# Patient Record
Sex: Female | Born: 1965 | Race: White | Hispanic: No | Marital: Married | Smoking: Never smoker
Health system: Southern US, Community
[De-identification: ages and names within clinical notes are randomized; demographics above are authoritative.]

## PROBLEM LIST (undated history)

## (undated) DIAGNOSIS — N3010 Interstitial cystitis (chronic) without hematuria: Secondary | ICD-10-CM

## (undated) DIAGNOSIS — E079 Disorder of thyroid, unspecified: Secondary | ICD-10-CM

## (undated) DIAGNOSIS — Z8042 Family history of malignant neoplasm of prostate: Secondary | ICD-10-CM

## (undated) DIAGNOSIS — D490 Neoplasm of unspecified behavior of digestive system: Secondary | ICD-10-CM

## (undated) DIAGNOSIS — Z803 Family history of malignant neoplasm of breast: Secondary | ICD-10-CM

## (undated) DIAGNOSIS — C801 Malignant (primary) neoplasm, unspecified: Secondary | ICD-10-CM

## (undated) HISTORY — DX: Malignant (primary) neoplasm, unspecified: C80.1

## (undated) HISTORY — PX: TONSILLECTOMY: SUR1361

## (undated) HISTORY — DX: Neoplasm of unspecified behavior of digestive system: D49.0

## (undated) HISTORY — DX: Interstitial cystitis (chronic) without hematuria: N30.10

## (undated) HISTORY — DX: Family history of malignant neoplasm of breast: Z80.3

## (undated) HISTORY — DX: Family history of malignant neoplasm of prostate: Z80.42

## (undated) HISTORY — PX: BREAST BIOPSY: SHX20

## (undated) HISTORY — DX: Disorder of thyroid, unspecified: E07.9

---

## 1998-11-05 HISTORY — PX: TUBAL LIGATION: SHX77

## 1998-11-07 ENCOUNTER — Inpatient Hospital Stay (HOSPITAL_COMMUNITY): Admission: AD | Admit: 1998-11-07 | Discharge: 1998-11-10 | Payer: Self-pay | Admitting: *Deleted

## 1999-05-20 ENCOUNTER — Other Ambulatory Visit: Admission: RE | Admit: 1999-05-20 | Discharge: 1999-05-20 | Payer: Self-pay | Admitting: *Deleted

## 1999-11-30 ENCOUNTER — Other Ambulatory Visit: Admission: RE | Admit: 1999-11-30 | Discharge: 1999-11-30 | Payer: Self-pay | Admitting: Obstetrics and Gynecology

## 2000-11-18 ENCOUNTER — Other Ambulatory Visit: Admission: RE | Admit: 2000-11-18 | Discharge: 2000-11-18 | Payer: Self-pay | Admitting: Obstetrics and Gynecology

## 2001-11-20 ENCOUNTER — Other Ambulatory Visit: Admission: RE | Admit: 2001-11-20 | Discharge: 2001-11-20 | Payer: Self-pay | Admitting: Obstetrics and Gynecology

## 2002-03-25 ENCOUNTER — Encounter: Admission: RE | Admit: 2002-03-25 | Discharge: 2002-03-25 | Payer: Self-pay | Admitting: Emergency Medicine

## 2002-03-25 ENCOUNTER — Encounter: Payer: Self-pay | Admitting: Emergency Medicine

## 2002-07-24 ENCOUNTER — Encounter: Admission: RE | Admit: 2002-07-24 | Discharge: 2002-07-24 | Payer: Self-pay | Admitting: Emergency Medicine

## 2002-07-24 ENCOUNTER — Encounter: Payer: Self-pay | Admitting: Emergency Medicine

## 2002-12-06 ENCOUNTER — Other Ambulatory Visit: Admission: RE | Admit: 2002-12-06 | Discharge: 2002-12-06 | Payer: Self-pay | Admitting: Obstetrics and Gynecology

## 2003-07-05 ENCOUNTER — Ambulatory Visit (HOSPITAL_BASED_OUTPATIENT_CLINIC_OR_DEPARTMENT_OTHER): Admission: RE | Admit: 2003-07-05 | Discharge: 2003-07-05 | Payer: Self-pay | Admitting: Urology

## 2003-07-05 ENCOUNTER — Ambulatory Visit (HOSPITAL_COMMUNITY): Admission: RE | Admit: 2003-07-05 | Discharge: 2003-07-05 | Payer: Self-pay | Admitting: Urology

## 2003-07-05 ENCOUNTER — Encounter (INDEPENDENT_AMBULATORY_CARE_PROVIDER_SITE_OTHER): Payer: Self-pay | Admitting: Specialist

## 2004-02-28 ENCOUNTER — Other Ambulatory Visit: Admission: RE | Admit: 2004-02-28 | Discharge: 2004-02-28 | Payer: Self-pay | Admitting: Obstetrics and Gynecology

## 2004-11-30 ENCOUNTER — Encounter: Admission: RE | Admit: 2004-11-30 | Discharge: 2004-11-30 | Payer: Self-pay | Admitting: Obstetrics and Gynecology

## 2005-03-16 ENCOUNTER — Other Ambulatory Visit: Admission: RE | Admit: 2005-03-16 | Discharge: 2005-03-16 | Payer: Self-pay | Admitting: Obstetrics and Gynecology

## 2005-03-25 ENCOUNTER — Encounter: Admission: RE | Admit: 2005-03-25 | Discharge: 2005-03-25 | Payer: Self-pay | Admitting: Obstetrics and Gynecology

## 2006-01-03 ENCOUNTER — Encounter: Admission: RE | Admit: 2006-01-03 | Discharge: 2006-01-03 | Payer: Self-pay | Admitting: Internal Medicine

## 2006-08-06 DIAGNOSIS — C801 Malignant (primary) neoplasm, unspecified: Secondary | ICD-10-CM

## 2006-08-06 HISTORY — PX: APPENDECTOMY: SHX54

## 2006-08-06 HISTORY — DX: Malignant (primary) neoplasm, unspecified: C80.1

## 2006-08-22 ENCOUNTER — Encounter (INDEPENDENT_AMBULATORY_CARE_PROVIDER_SITE_OTHER): Payer: Self-pay | Admitting: Specialist

## 2006-08-22 ENCOUNTER — Inpatient Hospital Stay (HOSPITAL_COMMUNITY): Admission: EM | Admit: 2006-08-22 | Discharge: 2006-08-24 | Payer: Self-pay | Admitting: Emergency Medicine

## 2006-10-17 ENCOUNTER — Ambulatory Visit: Payer: Self-pay | Admitting: Hematology and Oncology

## 2006-10-25 LAB — COMPREHENSIVE METABOLIC PANEL
ALT: 16 U/L (ref 0–35)
Albumin: 4.3 g/dL (ref 3.5–5.2)
BUN: 17 mg/dL (ref 6–23)
Glucose, Bld: 93 mg/dL (ref 70–99)
Potassium: 3.9 mEq/L (ref 3.5–5.3)
Sodium: 138 mEq/L (ref 135–145)
Total Bilirubin: 1.5 mg/dL — ABNORMAL HIGH (ref 0.3–1.2)

## 2006-10-25 LAB — CBC WITH DIFFERENTIAL/PLATELET
BASO%: 0.1 % (ref 0.0–2.0)
EOS%: 0.6 % (ref 0.0–7.0)
Eosinophils Absolute: 0 10*3/uL (ref 0.0–0.5)
HCT: 38.7 % (ref 34.8–46.6)
MCHC: 35.3 g/dL (ref 32.0–36.0)
MCV: 86.5 fL (ref 81.0–101.0)
NEUT#: 3.1 10*3/uL (ref 1.5–6.5)
Platelets: 177 10*3/uL (ref 145–400)
WBC: 5.6 10*3/uL (ref 3.9–10.0)
lymph#: 2.1 10*3/uL (ref 0.9–3.3)

## 2006-11-14 ENCOUNTER — Ambulatory Visit: Payer: Self-pay | Admitting: Gastroenterology

## 2006-11-15 ENCOUNTER — Ambulatory Visit: Payer: Self-pay | Admitting: Gastroenterology

## 2007-04-17 HISTORY — PX: COLONOSCOPY: SHX174

## 2010-06-16 ENCOUNTER — Encounter: Admission: RE | Admit: 2010-06-16 | Discharge: 2010-06-16 | Payer: Self-pay | Admitting: Internal Medicine

## 2011-01-22 NOTE — Discharge Summary (Signed)
[name], [name]                 ACCOUNT NO.:  [id number]   MEDICAL RECORD NO.:  [id number]          PATIENT TYPE:  INP   LOCATION:  5736                         FACILITY:  [facility]   PHYSICIAN:  Velora Heckler, MD      DATE OF BIRTH:  01-Jun-1966   DATE OF ADMISSION:  08/22/2006  DATE OF DISCHARGE:  08/24/2006                               DISCHARGE SUMMARY   ADMITTING PHYSICIAN:  Dr. Janee Morn.   DISCHARGING PHYSICIAN:  Dr. Gerrit Friends.   CHIEF COMPLAINT AND REASON FOR ADMISSION:  [name] is a 45 year old  female patient who presented to the ER because of right lower quadrant  pain beginning 3 days prior.  She had a recent gastrointestinal illness  and thought her pain was coming from that.  She saw her primary care  physician prior to coming to the ER, and he was concerned about  appendicitis; so he sent her to ER for further evaluation.  In the ER,  the patient was afebrile; vital signs were stable.  Her abdomen was  tender in the right lower quadrant with localized guarding, and there  was also a vague mass in the right lower quadrant.  Bowel sounds were  hypoactive.  She did not have any peritoneal signs.  White count was  9400.  CT scan was done that showed acute appendicitis with possible  rupture and a very small fluid collection.  The patient was admitted  with a diagnosis of acute appendicitis, possibly perforated.   HOSPITAL COURSE:  The patient was taken directly from the ER to the  operating room by Dr. Janee Morn where she underwent a laparoscopic  appendectomy for an acute perforated appendicitis.  The patient was  stable and sent back to the surgical floor to recover.   On postop day 1, the patient was stable and white count 10,000,  hemoglobin 11.3.  She was awake, passing flatus, having mild nausea, but  sipping on clears.  She had diminished pain in her abdomen as compared  to preoperatively.  Her abdominal exam revealed a soft, slightly  distended abdomen with active  bowel sounds.  Her incisions were clean,  dry, and intact.  The patient felt she may be having some nausea related  to Percocet; so Vicodin was initiated for pain control.  Toradol was  added IV.  She was continued on Invanz for empiric coverage for the  abscess cultures.   On postop day 2, the patient's white count had normalized to 4800.  Potassium was slightly low at 3.1, and this was repleted orally.  She  was tolerating clears.  She felt much better.  She was passing flatus  and wanted her diet advanced.  Her incisions were clean, dry, and  intact.  Her IV Invanz was discontinued, and she was switched to  Augmentin.  Her diet was advanced, and plans were to discharge home  later in the day.  In addition, she found that she could tolerate the  Percocet for pain and requested that as her discharge pain medication.   FINAL DISCHARGE DIAGNOSIS:  Perforated appendicitis status-post  laparoscopic appendectomy.   DISCHARGE MEDICATIONS:  1. Resume any home medications you were taking prior to admission.  2. Percocet 5/325 mg 1 to 2 tabs every 4 hours as needed for pain; #40      dispensed with zero refills.  3. Augmentin 875 mg b.i.d. for 7 days.   RETURN TO WORK:  In 2 weeks.   DIET:  No restrictions.   ACTIVITY:  May shower and walk up steps.  May increase activity slowly.  No driving while taking Percocet.  No lifting more than 15 pounds for 2  weeks.   WOUND CARE:  Allow Steri-Strips to fall off.   OTHER INSTRUCTIONS:  Call the surgeon if:  A.  Fever more than 101 degrees Fahrenheit  B.  Nausea, vomiting, or diarrhea.  C.  New or increased belly pain.  D.  Redness or drainage from wounds.   FOLLOWUP APPOINTMENT:  You are to see Dr. Janee Morn in 2 weeks.  Please  call for an appointment.      [name], N.P.      Velora Heckler, MD  Electronically Signed    ALE/MEDQ  D:  10/11/2006  T:  10/11/2006  Job:  [id number]   cc:   Gabrielle Dare. Janee Morn, M.D.

## 2011-01-22 NOTE — H&P (Signed)
[name], [name]                 ACCOUNT NO.:  [id number]   MEDICAL RECORD NO.:  [id number]          PATIENT TYPE:  INP   LOCATION:  1826                         FACILITY:  [facility]   PHYSICIAN:  Gabrielle Dare. Janee Morn, M.D.DATE OF BIRTH:  [birth year]   DATE OF ADMISSION:  08/22/2006  DATE OF DISCHARGE:                              HISTORY & PHYSICAL   CHIEF COMPLAINT:  Right lower quadrant abdominal pain.   HISTORY OF PRESENT ILLNESS:  [name] is a pleasant 45 year old white  female who complains of right lower quadrant pain that initially started  3 days ago on Friday.  It persisted through the weekend.  She had a  recent GI illness, and she attributed the pain to that.  The pain was  persistent, and she went and saw Dr. Zachery Dauer this morning.  Dr. Zachery Dauer  was very concerned about possible appendicitis, and she was sent to  [facility] Emergency Department for further evaluation of such.  She  continues to have pain in the right lower quadrant with anorexia but no  emesis.   PAST MEDICAL HISTORY:  1. Hypothyroidism.  2. Interstitial cystitis.   PAST SURGICAL HISTORY:  1. Tonsillectomy.  2. Tubal ligation.   SOCIAL HISTORY:  She does not smoke.  She rarely drinks alcohol.  She  works as a Production designer, theatre/television/film in [employer].   ALLERGIES:  SULFA, which causes hives.  She has some intolerance to ASPIRIN.   MEDICATIONS:  1. Armour Thyroid.  2. Zyrtec, which she describes as a Zyrtec equivalent.   REVIEW OF SYSTEMS:  A 15-system review is completed.  It is just  positive for GI section for anorexia and abdominal pain as listed above.  No other pertinent positives were noted.   PHYSICAL EXAMINATION:  VITAL SIGNS:  Temperature 97.8, pulse of 97,  respirations 16, blood pressure 130/75.  GENERAL:  She is awake, alert, and well-appearing.  HEENT:  Pupils are equal.  Oral mucosa is dry.  NECK:  Supple with no masses.  LUNGS:  Clear to auscultation.  HEART:  Regular, with no murmurs  heard, and pulse is palpable in the  left chest.  ABDOMEN:  Tender in the right lower quadrant with localized guarding.  There is also a vague mass present down there.  Bowel sounds are  hypoactive.  The remainder of the abdomen is not tender.  There is no  significant distention.  SKIN:  Warm and dry, with no rashes.  CNS:  No focal weakness on gross examination.   LABORATORY STUDIES:  Include liver function tests that are within normal  limits.  Sodium 135, potassium 3.2, chloride 99, CO2 of 28, BUN 6,  creatinine 0.6, glucose 100.  White blood cell count 9.4, hemoglobin  13.9, hematocrit 40, platelets 197.  Laboratory studies include CT scan  of the abdomen and pelvis showing acute appendicitis with possible  rupture and a very small fluid collection.   IMPRESSION:  Acute appendicitis, possibly ruptured.   PLAN:  We will start IV antibiotics immediately.  We will take her to  the operating room emergently tonight for  laparoscopic appendectomy.  The procedure, risks, and benefits were discussed in detail with the  patient.  We also discussed the possibility for conversion to an open  procedure or an exploratory laparotomy.  Questions were answered, and  she is agreeable.      Gabrielle Dare Janee Morn, M.D.  Electronically Signed     BET/MEDQ  D:  08/22/2006  T:  08/23/2006  Job:  [id number]   cc:   Allena Napoleon

## 2011-01-22 NOTE — Op Note (Signed)
[name], [name]                           ACCOUNT NO.:  [id number]   MEDICAL RECORD NO.:  [id number]                   PATIENT TYPE:  AMB   LOCATION:  NESC                                 FACILITY:  [facility]   PHYSICIAN:  Jamison Neighbor, M.D.               DATE OF BIRTH:  [birth year]   DATE OF PROCEDURE:  07/05/2003  DATE OF DISCHARGE:                                 OPERATIVE REPORT   SERVICE:  Urology.   PREOPERATIVE DIAGNOSES:  Pelvic pain, possible interstitial cystitis.   POSTOPERATIVE DIAGNOSES:  Pelvic pain, possible interstitial cystitis.   PROCEDURE:  Cystoscopy, urethral calibration, hydrodistention of the  bladder, Marcaine and Pyridium installation, Marcaine and Kenalog injection.   SURGEON:  Jamison Neighbor, M.D.   ANESTHESIA:  General.   COMPLICATIONS:  None.   DRAINS:  None.   BRIEF HISTORY:  This 45 year old female developed problems with urination up  to 18 times a day. This has been going on for the past month to month and a  half. The patient was evaluated in the office on June 19, 2003 and it was  felt that based on her symptoms and exam that she might have interstitial  cystitis. She was given the opportunity to have potassium testing but  changed her mind and elected to undergo cystoscopy and hydrodistention. She  is aware of the fact that there is no guarantee that this will give her any  relief but if she does get relief and turns out to have interstitial  cystitis that this can last for a viable length of time. It certainly will  not be a long-term cure for her problem. The patient gave full and informed  consent.   DESCRIPTION OF PROCEDURE:  After successful induction of general anesthesia,  the patient was placed in the dorsal lithotomy position, prepped with  Betadine and draped in the usual sterile fashion. Careful bimanual  examination revealed no cystocele, rectocele or enterocele. There were no  masses on bimanual exam. The urethra  was of normal caliber accepting a 78  [place] female urethral sound with no evidence of stenosis or stricture. The  cystoscope was inserted, the bladder was carefully inspected and was free of  any tumor or stones. Both ureteral orifices were normal in configuration and  location. The bladder was distended at a pressure of 100 cm of water for  five minutes and when the bladder was drained the patient was found to have  a normal bladder capacity of 1100 mL. The patient had modest glomerulations  present but no ulcers were seen. It is felt that based on the patient's  symptoms as well as these findings that she may have early interstitial  cystitis but a definitive decision as to whether this is an appropriate way  to treat her symptoms will depend on the results of the hydrodistention in  so far as  symptom control  is concerned and also results of a bladder biopsy. The  biopsy was performed and the biopsy site was cauterized. This was sent for  mast cell analysis. The patient tolerated the procedure well and was taken  to the recovery room in good condition.                                               Jamison Neighbor, M.D.    RJE/MEDQ  D:  07/05/2003  T:  07/05/2003  Job:  [id number]   cc:   Marcelino Duster L. Vincente Poli, M.D.  [address], [address]  [place] [address]  Fax: [phone]   Reuben Likes, M.D.  [address]  [place]  [place] [address]  Fax: [phone]

## 2011-01-22 NOTE — Op Note (Signed)
NAMETIYANA, GALLA                 ACCOUNT NO.:  [id number]   MEDICAL RECORD NO.:  [id number]          PATIENT TYPE:  INP   LOCATION:  1826                         FACILITY:  [facility]   PHYSICIAN:  Gabrielle Dare. Janee Morn, M.D.DATE OF BIRTH:  Dec 28, 1965   DATE OF PROCEDURE:  08/22/2006  DATE OF DISCHARGE:                               OPERATIVE REPORT   PREOPERATIVE DIAGNOSIS:  Acute appendicitis with possible perforation.   POSTOPERATIVE DIAGNOSIS:  Acute perforated appendicitis.   PROCEDURE:  Laparoscopic appendectomy.   SURGEON:  Gabrielle Dare. Janee Morn, M.D.   ANESTHESIA:  General.   HISTORY OF PRESENT ILLNESS:  Ms. Tokar is a 45 year old white female who  was seen in Dr. Martyn Ehrich office today complaining of right lower  quadrant abdominal pain since last Friday.  She was sent to the [facility] emergency department where workup included a CT scan of the abdomen  and pelvis.  This demonstrated acute appendicitis with possible  perforation.  She was brought emergently to the operating room for  laparoscopic appendectomy.   PROCEDURE IN DETAIL:  Informed consent was obtained.  The patient  received intravenous antibiotics.  She was brought to the operating  room, and general anesthesia was administered.  Her abdomen was prepped  and draped in a sterile fashion.  The infraumbilical region was  infiltrated with 0.25% Marcaine with epinephrine.   An infraumbilical incision was made.  The subcutaneous tissues were  dissected down, revealing the anterior fascia.  This was divided along  the midline, and the peritoneal cavity was entered under direct vision  without difficulty.  A 0 Vicryl pursestring suture was placed around the  fascial opening, and the Hassan trocar was inserted into the abdomen.  The abdomen was insufflated with carbon dioxide in standard fashion.  Under direct vision, a 12-mm left lower quadrant and a 5-mm right upper  quadrant port were placed. 0.25% Marcaine with  epinephrine was used at  both port sites.  Laparoscopic exploration revealed inflammation coming  down off of the base of the cecum, with the terminal ileum plastered up  against the pelvic sidewall that was gently swept away, revealing a very  inflamed and perforated appendix that was stuck down next to the round  ligament on the right.  This was carefully swept away from the pelvic  sidewall, allowing the cecum to be more mobilized.  The base of the  appendix was intact.  This was then dissected out and divided with an  endoscopic GIA stapler with a vascular load.  The mesoappendix was then  dissected out a little bit further, and the mesoappendix was divided  with an endoscopic GIA stapler with vascular load.  The appendix, which  was perforated along the midportion of the shaft, was placed in an  EndoCatch bag and taken out of the abdomen via the left lower quadrant  port site.  At this time, the abdomen was copiously irrigated with 3 L  of saline.  The area next to the round ligament pelvic sidewall was  debrided of some fibrinous exudate and copiously irrigated.  The  appendiceal base staple line was intact.  The mesoappendix was double  checked, and there was no bleeding.  Further irrigation was continued,  and this returned clear.  The staple lines were rechecked, and there was  no bleeding.  The remainder of the irrigation fluid was evacuated, and  it was clear.  The ports were then removed under direct vision.  The  pneumoperitoneum was released.  The [facility] trocar was removed.  The  infraumbilical fascia was closed by tying the 0 Vicryl pursestring  suture, with care not to trap any intra-abdominal contents.  All three  wounds were copiously irrigated.  Some additional local anesthetic was  injected, and the skin of each was closed with a running 4-0 Vicryl  subcuticular stitch.  Sponge, needle, and instrument counts were  correct.  Benzoin, Steri-Strips, and sterile dressings  were applied.   The patient tolerated procedure well without apparent complication and  was taken to the recovery room in stable condition.      Gabrielle Dare Janee Morn, M.D.  Electronically Signed     BET/MEDQ  D:  08/23/2006  T:  08/23/2006  Job:  [id number]   cc:   Allena Napoleon

## 2011-01-22 NOTE — Assessment & Plan Note (Signed)
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   ASSESSMENT/PLAN:  Mrs. Fleer is a very nice 45 year old white female  referred through the courtesy of Dr. Allena Napoleon. She presented to  [facility] in mid-December with acute right lower quadrant pain  and was found to have acute appendicitis by CT scan. She underwent a  laparoscopic appendectomy by Dr. Violeta Gelinas on August 22, 2006,  which found an acute perforated appendicitis. Surgical pathology  revealed a cystic mucinous neoplasm with extravasated mucin extending to  the serosal surface. The base of the appendix was free of tumor. She was  seen in consultation by Dr. Flonnie Hailstone at [facility] who apparently is a gastrointestinal surgical oncologist.  Apparently, CT scan performed at [facility] revealed a small  lesion in the liver. Further evaluation is planned with an MRI. The CT  scan at [facility] prior to her appendectomy did not reveal any abnormality in  the liver.   She relates she had problems with constipation in the past, but has been  taking a fiber supplement that regularized her bowel habits. She has  been having more frequent stools recently, up to 2-3 times a day, which  is a slight change for her. She has a history of bleeding hemorrhoids,  which cause problems when she had constipation. These symptoms have been  absent for several years. There is no family history of colon cancer,  colon polyps or inflammatory bowel disease.   PAST MEDICAL HISTORY:  1. Interstitial cystitis.  2. Hypothyroidism.  3. 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  4. The remainder is in the History of  Present Illness.   CURRENT MEDICATIONS:  Listed on the chart; updated and reviewed.   MEDICATION ALLERGY:  SULFA DRUGS LEADING TO HIVES.   SOCIAL HISTORY:  Per the handwritten form.   REVIEW OF SYSTEMS:  Per the handwritten form.   PHYSICAL EXAMINATION:  Well-developed, well-nourished white female in no  acute distress. Height is 5 feet, 7 inches. Weight 137 pounds. Blood  pressure 106/54. Pulse 60 and regular.  HEENT: Anicteric sclerae. Oropharynx clear.  CHEST: Clear to auscultation bilaterally.  CARDIAC: Regular rate and rhythm without murmurs appreciated.  ABDOMEN: Soft and nontender. Nondistended. Normoactive bowel sounds. No  palpable organomegaly, masses or hernias.  RECTAL: Deferred to time of colonoscopy.  EXTREMITIES: Without clubbing, cyanosis or edema.  NEUROLOGIC: Alert and oriented x3. Grossly nonfocal.   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 Prior history of hematochezia and hemorrhoids-now      inactive. Recent change in bowel habits with more frequent stools.      Rule out colorectal neoplasms and rule out cecal involvement of the  mucinous cystic neoplasm. The base of the appendix was clear on      pathology so I feel it is unlikely we will find involvement of the      colon. Risks, benefits and alternatives to colonoscopy and possible      biopsy and possible polypectomy discussed with the patient and she      consents to proceed. This will be scheduled electively.  2. Abnormal CT scan of the liver. Awaiting results of her MRI from      [facility]. Further followup      with Dr. Flonnie Hailstone.     Venita Lick. Russella Dar, MD, [facility]  Electronically Signed    MTS/MedQ  DD: 11/14/2006  DT: 11/14/2006  Job #: [id number]   cc:   Allena Napoleon

## 2011-11-04 ENCOUNTER — Encounter: Payer: Self-pay | Admitting: Gastroenterology

## 2012-03-14 ENCOUNTER — Other Ambulatory Visit: Payer: Self-pay | Admitting: Internal Medicine

## 2012-03-14 DIAGNOSIS — N6019 Diffuse cystic mastopathy of unspecified breast: Secondary | ICD-10-CM

## 2012-03-23 ENCOUNTER — Other Ambulatory Visit: Payer: Self-pay

## 2012-03-24 ENCOUNTER — Ambulatory Visit
Admission: RE | Admit: 2012-03-24 | Discharge: 2012-03-24 | Disposition: A | Discharge status: Home / Self Care | Payer: BC Managed Care – PPO | Source: Ambulatory Visit | Attending: Internal Medicine | Admitting: Internal Medicine

## 2012-03-24 DIAGNOSIS — N6019 Diffuse cystic mastopathy of unspecified breast: Secondary | ICD-10-CM

## 2012-06-27 ENCOUNTER — Encounter: Payer: Self-pay | Admitting: Gastroenterology

## 2012-09-04 ENCOUNTER — Other Ambulatory Visit: Payer: Self-pay | Admitting: Internal Medicine

## 2012-09-04 DIAGNOSIS — N6019 Diffuse cystic mastopathy of unspecified breast: Secondary | ICD-10-CM

## 2012-09-22 ENCOUNTER — Ambulatory Visit
Admission: RE | Admit: 2012-09-22 | Discharge: 2012-09-22 | Disposition: A | Discharge status: Home / Self Care | Payer: BC Managed Care – PPO | Source: Ambulatory Visit | Attending: Internal Medicine | Admitting: Internal Medicine

## 2012-09-22 DIAGNOSIS — N6019 Diffuse cystic mastopathy of unspecified breast: Secondary | ICD-10-CM

## 2013-02-15 ENCOUNTER — Other Ambulatory Visit: Payer: Self-pay | Admitting: Internal Medicine

## 2013-02-15 DIAGNOSIS — D242 Benign neoplasm of left breast: Secondary | ICD-10-CM

## 2013-03-30 ENCOUNTER — Ambulatory Visit
Admission: RE | Admit: 2013-03-30 | Discharge: 2013-03-30 | Disposition: A | Discharge status: Home / Self Care | Payer: BC Managed Care – PPO | Source: Ambulatory Visit | Attending: Internal Medicine | Admitting: Internal Medicine

## 2013-03-30 DIAGNOSIS — D242 Benign neoplasm of left breast: Secondary | ICD-10-CM

## 2014-02-26 ENCOUNTER — Other Ambulatory Visit: Payer: Self-pay | Admitting: Internal Medicine

## 2014-02-26 DIAGNOSIS — N63 Unspecified lump in unspecified breast: Secondary | ICD-10-CM

## 2014-03-11 ENCOUNTER — Encounter: Payer: Self-pay | Admitting: Family Medicine

## 2014-03-11 ENCOUNTER — Other Ambulatory Visit (INDEPENDENT_AMBULATORY_CARE_PROVIDER_SITE_OTHER): Payer: BC Managed Care – PPO

## 2014-03-11 ENCOUNTER — Ambulatory Visit (INDEPENDENT_AMBULATORY_CARE_PROVIDER_SITE_OTHER): Payer: BC Managed Care – PPO | Admitting: Family Medicine

## 2014-03-11 VITALS — BP 122/82 | HR 75 | Ht 66.5 in | Wt 174.0 lb

## 2014-03-11 DIAGNOSIS — M545 Low back pain, unspecified: Secondary | ICD-10-CM

## 2014-03-11 DIAGNOSIS — M79671 Pain in right foot: Secondary | ICD-10-CM

## 2014-03-11 DIAGNOSIS — M79673 Pain in unspecified foot: Secondary | ICD-10-CM | POA: Insufficient documentation

## 2014-03-11 DIAGNOSIS — M79609 Pain in unspecified limb: Secondary | ICD-10-CM

## 2014-03-11 DIAGNOSIS — M2140 Flat foot [pes planus] (acquired), unspecified foot: Secondary | ICD-10-CM

## 2014-03-11 DIAGNOSIS — M9981 Other biomechanical lesions of cervical region: Secondary | ICD-10-CM

## 2014-03-11 DIAGNOSIS — M549 Dorsalgia, unspecified: Secondary | ICD-10-CM | POA: Insufficient documentation

## 2014-03-11 DIAGNOSIS — M999 Biomechanical lesion, unspecified: Secondary | ICD-10-CM | POA: Insufficient documentation

## 2014-03-11 DIAGNOSIS — M2141 Flat foot [pes planus] (acquired), right foot: Secondary | ICD-10-CM | POA: Insufficient documentation

## 2014-03-11 DIAGNOSIS — M2142 Flat foot [pes planus] (acquired), left foot: Secondary | ICD-10-CM

## 2014-03-11 MED ORDER — NITROGLYCERIN 0.2 MG/HR TD PT24: MEDICATED_PATCH | TRANSDERMAL | Status: DC

## 2014-03-11 NOTE — Patient Instructions (Addendum)
Good to see you again Have a great trip Wear the brace to help support the heel.  Ice bath 20 minutes 2 times daily.  Consider turmeric 500mg  twice daily. And make sure Vitamin D 2000 IU daily at least.  NEVER BAREFOOT foot now.  Come back after trip for more manipulaito nand to check the heel.  You may need custom orthotics in the long run.  Nitroglycerin Protocol   Apply 1/4 nitroglycerin patch to affected area daily.  Change position of patch within the affected area every 24 hours.  You may experience a headache during the first 1-2 weeks of using the patch, these should subside.  If you experience headaches after beginning nitroglycerin patch treatment, you may take your preferred over the counter pain reliever.  Another side effect of the nitroglycerin patch is skin irritation or rash related to patch adhesive.  Please notify our office if you develop more severe headaches or rash, and stop the patch.  Tendon healing with nitroglycerin patch may require 12 to 24 weeks depending on the extent of injury.  Men should not use if taking Viagra, Cialis, or Levitra.   Do not use if you have migraines or rosacea.

## 2014-03-11 NOTE — Assessment & Plan Note (Signed)
Patient does have poor core strength. Patient was given some home exercises as well as some postural exercises that I think will be beneficial. We discussed icing and over-the-counter medications that can be beneficial. Patient did respond very well to manipulation and will continue to come on a fairly regular basis. Patient will come back again in 3 weeks for further evaluation and treatment.

## 2014-03-11 NOTE — Progress Notes (Signed)
[name] [place] [place], [place] [address] Phone: [phone] Subjective:    I'm seeing this patient by the request  of:  Dr. Adriana Simas  CC: Right heel pain she had chronic back pain  [id number] [name] is a 48 y.o. female coming in with complaint of right heel pain and chronic back pain.  The patient has had this heel pain for at least one month. Patient states it seems to be getting worse. Patient states it is worse with walking. Denies any radiation of pain or any numbness or tingling. Patient states though that of a dull aching pain even at rest now. Patient states that she noticed it first when she was walking [place]. Patient was walking significant amount of mileage at that time. Since then she continues to try to work out more and is unable to do so secondary to the pain history severity of 8/10.   She is also having some chronic low back pain. Patient has had this pain for quite some time. Patient has tried many modalities including acupuncture and chiropractic with minimal to moderate benefit. Patient states that she has had x-rays previously which were fairly unremarkable. Denies any radiation into the legs any numbness or weakness. Denies any association with bowel or bladder incontinence. Overall more of a dull aching sensation that seems to be localized in worse after activity.    Past medical history, social, surgical and family history all reviewed in electronic medical record.   Review of Systems: No headache, visual changes, nausea, vomiting, diarrhea, constipation, dizziness, abdominal pain, skin rash, fevers, chills, night sweats, weight loss, swollen lymph nodes, body aches, joint swelling, muscle aches, chest pain, shortness of breath, mood changes.   Objective Blood pressure 122/82, pulse 75, height 5' 6.5" (1.689 m), weight 174 lb (78.926 kg), SpO2 98.00%.  General: No apparent distress alert and oriented x3  mood and affect normal, dressed appropriately. Overweight HEENT: Pupils equal, extraocular movements intact  Respiratory: Patient's speak in full sentences and does not appear short of breath  Cardiovascular: No lower extremity edema, non tender, no erythema  Skin: Warm dry intact with no signs of infection or rash on extremities or on axial skeleton.  Abdomen: Soft nontender  Neuro: Cranial nerves II through XII are intact, neurovascularly intact in all extremities with 2+ DTRs and 2+ pulses.  Lymph: No lymphadenopathy of posterior or anterior cervical chain or axillae bilaterally.  Gait antalgic with good balance and coordination.  MSK:  Non tender with full range of motion and good stability and symmetric strength and tone of shoulders, elbows, wrist, hip, knee and ankles bilaterally.  Back Exam:  Inspection: Unremarkable but poor course strength Motion: Flexion 45 deg, Extension 25 deg, Side Bending to 35 deg bilaterally,  Rotation to 35 deg bilaterally  SLR laying: Negative  XSLR laying: Negative  Palpable tenderness: mild paraspinal musculature of the lumbar spine but no spinous process tenderness  FABER: negative. Sensory change: Gross sensation intact to all lumbar and sacral dermatomes.  Reflexes: 2+ at both patellar tendons, 2+ at achilles tendons, Babinski's downgoing.  Strength at foot  Plantar-flexion: 5/5 Dorsi-flexion: 5/5 Eversion: 5/5 Inversion: 5/5  Leg strength  Quad: 5/5 Hamstring: 5/5 Hip flexor: 5/5 Hip abductors: 5/5  Gait antalgic Foot exam shows the patient does have mild pes planus bilaterally with significant over pronation of the hindfoot.  OMT Physical Exam   Standing flexion left  Seated Flexion Left Cervical  C2 flexed  rotated and side bent right C4 flexed rotated inside that were  Thoracic T6 extended rotated and side bent left T9 extended rotated and side bent right  Lumbar L2, 3 flexed rotated inside that right  Sacrum Left on left      Impression and Recommendations:     This case required medical decision making of moderate complexity.

## 2014-03-11 NOTE — Assessment & Plan Note (Signed)
Decision today to treat with OMT was based on Physical Exam  After verbal consent patient was treated with HVLA and ME techniques in , thoracic, lumbar and sacral areas  Patient tolerated the procedure well with improvement in symptoms  Patient given exercises, stretches and lifestyle modifications  See medications in patient instructions if given  Patient will follow up in 3 weeks

## 2014-03-11 NOTE — Assessment & Plan Note (Signed)
Patient is having a stress reaction of the calcaneus with hypoechoic changes on ultrasound as well as what appears to be a callus formation forming. Patient was given a compression to help decrease the amount of pressure on this area. We discussed icing and will start a nitroglycerin patch. Patient will try these interventions and come back again in 3-4 weeks for further evaluation and treatment.

## 2014-04-04 ENCOUNTER — Encounter: Payer: Self-pay | Admitting: Family Medicine

## 2014-04-04 ENCOUNTER — Ambulatory Visit (INDEPENDENT_AMBULATORY_CARE_PROVIDER_SITE_OTHER): Payer: BC Managed Care – PPO | Admitting: Family Medicine

## 2014-04-04 ENCOUNTER — Other Ambulatory Visit (INDEPENDENT_AMBULATORY_CARE_PROVIDER_SITE_OTHER): Payer: BC Managed Care – PPO

## 2014-04-04 VITALS — BP 110/80 | HR 102 | Temp 98.5°F | Wt 175.0 lb

## 2014-04-04 DIAGNOSIS — M545 Low back pain, unspecified: Secondary | ICD-10-CM

## 2014-04-04 DIAGNOSIS — M999 Biomechanical lesion, unspecified: Secondary | ICD-10-CM

## 2014-04-04 DIAGNOSIS — M79609 Pain in unspecified limb: Secondary | ICD-10-CM

## 2014-04-04 DIAGNOSIS — M9981 Other biomechanical lesions of cervical region: Secondary | ICD-10-CM

## 2014-04-04 DIAGNOSIS — M79671 Pain in right foot: Secondary | ICD-10-CM

## 2014-04-04 NOTE — Assessment & Plan Note (Signed)
Patient is back pain is improving. With patient though being in a Cam Walker for the next couple weeks she likely will have malalignment. Patient will continue with the exercises as well as the natural supplementation. We gave patient more core exercises of my but official and to avoid long walking at this time. Patient will come back and see me again in 2 weeks for further evaluation.

## 2014-04-04 NOTE — Patient Instructions (Signed)
Good to see you Wear the boot except for driving and sleeping for next 2 weeks Continue the nitro patch Ice 20 minutes at night.  Vitamin D 4000IU dialy for 2 weeks then back to 2000IU daily Come back in 2-3 weeks and we will see how we are doing.  Good luck with your son.

## 2014-04-04 NOTE — Progress Notes (Signed)
[name] [address] [place], [place] [address] Phone: [phone] Subjective:    CC: Right heel pain she had chronic back pain follow up.   DPO:[name] is a 48 y.o. female coming in with complaint of right heel pain and chronic back pain.   right heel pain before and did have a calcaneal stress fracture after all her walking. Patient was given a brace, nitroglycerin as well as ice protocol. Patient states mild improvement 10%.  Still pain with walking, no improvement with nitro. No side effects  No nighttime awakenings.     She is also having some chronic low back pain.     Past medical history, social, surgical and family history all reviewed in electronic medical record.   Review of Systems: No headache, visual changes, nausea, vomiting, diarrhea, constipation, dizziness, abdominal pain, skin rash, fevers, chills, night sweats, weight loss, swollen lymph nodes, body aches, joint swelling, muscle aches, chest pain, shortness of breath, mood changes.   Objective Blood pressure 110/80, pulse 102, temperature 98.5 F (36.9 C), temperature source Oral, weight 175 lb (79.379 kg), SpO2 95.00%.  General: No apparent distress alert and oriented x3 mood and affect normal, dressed appropriately. Overweight HEENT: Pupils equal, extraocular movements intact  Respiratory: Patient's speak in full sentences and does not appear short of breath  Cardiovascular: No lower extremity edema, non tender, no erythema  Skin: Warm dry intact with no signs of infection or rash on extremities or on axial skeleton.  Abdomen: Soft nontender  Neuro: Cranial nerves II through XII are intact, neurovascularly intact in all extremities with 2+ DTRs and 2+ pulses.  Lymph: No lymphadenopathy of posterior or anterior cervical chain or axillae bilaterally.  Gait antalgic with good balance and coordination.  MSK:  Non tender with full range of motion and good stability  and symmetric strength and tone of shoulders, elbows, wrist, hip, knee and ankles bilaterally.  Back Exam:  Inspection: Unremarkable but poor course strength Motion: Flexion 45 deg, Extension 35 deg, Side Bending to 35 deg bilaterally,  Rotation to 35 deg bilaterally  SLR laying: Negative  XSLR laying: Negative  Palpable tenderness: mild paraspinal musculature of the lumbar spine but no spinous process tenderness  FABER: negative. Sensory change: Gross sensation intact to all lumbar and sacral dermatomes.  Reflexes: 2+ at both patellar tendons, 2+ at achilles tendons, Babinski's downgoing.  Strength at foot  Plantar-flexion: 5/5 Dorsi-flexion: 5/5 Eversion: 5/5 Inversion: 5/5  Leg strength  Quad: 5/5 Hamstring: 5/5 Hip flexor: 5/5 Hip abductors: 4/5  Gait antalgic Foot exam shows the patient does have mild pes planus bilaterally with significant over pronation of the hindfoot.  OMT Physical Exam   Standing flexion left  Seated Flexion Left Cervical  C2 flexed rotated and side bent right C4 flexed rotated inside that were  Thoracic T6 extended rotated and side bent left T9 extended rotated and side bent right  Lumbar L2,3 flexed rotated inside that right  Sacrum Left on left  MSK US performed of: right heel   This study was ordered, performed, and interpreted by Charlann Boxer D.O.  Foot/Ankle:   All structures visualized.   Talar dome unremarkable  Ankle mortise without effusion. Peroneus longus and brevis tendons unremarkable on long and transverse views without sheath effusions. Posterior tibialis, flexor hallucis longus, and flexor digitorum longus tendons unremarkable on long and transverse views without sheath effusions. Achilles tendon visualized along length of tendon and unremarkable on long and  transverse views without sheath effusion. Anterior Talofibular Ligament and Calcaneofibular Ligaments unremarkable and intact. Deltoid Ligament unremarkable and  intact. Plantar fascia intact and without effusion, normal thickness. Patient though calcaneus at the tip just proximal to the area of the insertion of plantar fascia has some good callus formation. Mild overlying hypoechoic changes but very minimal. Seems to be healing from previous exam.  IMPRESSION:  Calcaneal stress fracture seems to be healing.       Impression and Recommendations:     This case required medical decision making of moderate complexity.

## 2014-04-04 NOTE — Assessment & Plan Note (Signed)
Decision today to treat with OMT was based on Physical Exam  After verbal consent patient was treated with HVLA and ME techniques in , thoracic, lumbar and sacral areas  Patient tolerated the procedure well with improvement in symptoms  Patient given exercises, stretches and lifestyle modifications  See medications in patient instructions if given  Patient will follow up in 2 weeks

## 2014-04-04 NOTE — Assessment & Plan Note (Signed)
Patient virtually continues to have pain and did not make any significant improvement with previous treatment. At this point to make a more conservative and palpation of the Cam Walker boot. Patient though she'll be wearing a somewhere between 2 and 4 weeks. We discussed starting the icing protocol again in continuing a nitroglycerin patch. Patient will do this and come back in 2 weeks for further evaluation we will do another ultrasound to further evaluate.  Spent greater than 25 minutes with patient face-to-face and had greater than 50% of counseling including as described above in assessment and plan.

## 2014-04-04 NOTE — Progress Notes (Signed)
Pre visit review using our clinic review tool, if applicable. No additional management support is needed unless otherwise documented below in the visit note. 

## 2014-04-05 ENCOUNTER — Ambulatory Visit
Admission: RE | Admit: 2014-04-05 | Discharge: 2014-04-05 | Disposition: A | Discharge status: Home / Self Care | Payer: BC Managed Care – PPO | Source: Ambulatory Visit | Attending: Internal Medicine | Admitting: Internal Medicine

## 2014-04-05 ENCOUNTER — Other Ambulatory Visit: Payer: Self-pay | Admitting: Internal Medicine

## 2014-04-05 DIAGNOSIS — N63 Unspecified lump in unspecified breast: Secondary | ICD-10-CM

## 2014-04-26 ENCOUNTER — Other Ambulatory Visit (INDEPENDENT_AMBULATORY_CARE_PROVIDER_SITE_OTHER): Payer: BC Managed Care – PPO

## 2014-04-26 ENCOUNTER — Ambulatory Visit (INDEPENDENT_AMBULATORY_CARE_PROVIDER_SITE_OTHER): Payer: BC Managed Care – PPO | Admitting: Family Medicine

## 2014-04-26 VITALS — BP 108/70 | HR 71 | Ht 66.5 in | Wt 175.0 lb

## 2014-04-26 DIAGNOSIS — M999 Biomechanical lesion, unspecified: Secondary | ICD-10-CM

## 2014-04-26 DIAGNOSIS — M79609 Pain in unspecified limb: Secondary | ICD-10-CM

## 2014-04-26 DIAGNOSIS — M545 Low back pain, unspecified: Secondary | ICD-10-CM

## 2014-04-26 DIAGNOSIS — M79671 Pain in right foot: Secondary | ICD-10-CM

## 2014-04-26 DIAGNOSIS — M9981 Other biomechanical lesions of cervical region: Secondary | ICD-10-CM

## 2014-04-26 NOTE — Assessment & Plan Note (Signed)
Patient is doing remarkably better. Patient still has poor core strengthening showed other exercises that I think will be beneficial. We discussed activities to avoid and what activities would be helpful. Patient will come back again in 3-4 weeks for further evaluation and treatment. Patient's back likely is going to continue to improve we can decrease the frequency of her visits.

## 2014-04-26 NOTE — Progress Notes (Signed)
[name] [place], [place] [address] Phone: [phone] Subjective:    CC: Right heel pain she had chronic back pain follow up.   [id number] [name] is a 48 y.o. female coming in with complaint of right heel pain and chronic back pain.   right heel pain before and did have a calcaneal stress fracture.  Patient did not make any significant improvement after last visit. The patient was the Cam Walker for the last 3 weeks. Patient continued on a nitroglycerin patch as well as a home exercise program.  Patient overall is doing fairly well. Patient states that she is having very minimal pain. Patient has been on the [employer] now for approximately 1 week. Patient continues to do the nitroglycerin with no side effects. Patient states that everyday it seems to be getting better. Has not started walking or any significant exercise at this time. No new symptoms.    She is also having some chronic low back pain.     Past medical history, social, surgical and family history all reviewed in electronic medical record.   Review of Systems: No headache, visual changes, nausea, vomiting, diarrhea, constipation, dizziness, abdominal pain, skin rash, fevers, chills, night sweats, weight loss, swollen lymph nodes, body aches, joint swelling, muscle aches, chest pain, shortness of breath, mood changes.   Objective Blood pressure 108/70, pulse 71, height 5' 6.5" (1.689 m), weight 175 lb (79.379 kg), SpO2 97.00%.  General: No apparent distress alert and oriented x3 mood and affect normal, dressed appropriately. Overweight HEENT: Pupils equal, extraocular movements intact  Respiratory: Patient's speak in full sentences and does not appear short of breath  Cardiovascular: No lower extremity edema, non tender, no erythema  Skin: Warm dry intact with no signs of infection or rash on extremities or on axial skeleton.  Abdomen: Soft nontender  Neuro: Cranial  nerves II through XII are intact, neurovascularly intact in all extremities with 2+ DTRs and 2+ pulses.  Lymph: No lymphadenopathy of posterior or anterior cervical chain or axillae bilaterally.  Gait antalgic with good balance and coordination.  MSK:  Non tender with full range of motion and good stability and symmetric strength and tone of shoulders, elbows, wrist, hip, knee and ankles bilaterally.  Back Exam:  Inspection: Unremarkable but poor course strength Motion: Flexion 45 deg, Extension 35 deg, Side Bending to 35 deg bilaterally,  Rotation to 35 deg bilaterally  SLR laying: Negative  XSLR laying: Negative  Palpable tenderness: mild paraspinal musculature of the lumbar spine but no spinous process tenderness  FABER: negative. Sensory change: Gross sensation intact to all lumbar and sacral dermatomes.  Reflexes: 2+ at both patellar tendons, 2+ at achilles tendons, Babinski's downgoing.  Strength at foot  Plantar-flexion: 5/5 Dorsi-flexion: 5/5 Eversion: 5/5 Inversion: 5/5  Leg strength  Quad: 5/5 Hamstring: 5/5 Hip flexor: 5/5 Hip abductors: 4/5  Gait antalgic Foot exam shows the patient does have mild pes planus bilaterally with significant over pronation of the hindfoot.  OMT Physical Exam   Standing flexion left  Seated Flexion Left Cervical  C2 flexed rotated and side bent right C4 flexed rotated inside that were  Thoracic T6 extended rotated and side bent left T9 extended rotated and side bent right  Lumbar L2,3 flexed rotated inside that right  Sacrum Left on left  MSK US performed of: right heel   This study was ordered, performed, and interpreted by Charlann Boxer D.O.  Foot/Ankle:   All  structures visualized.   Talar dome unremarkable  Ankle mortise without effusion. Peroneus longus and brevis tendons unremarkable on long and transverse views without sheath effusions. Posterior tibialis, flexor hallucis longus, and flexor digitorum longus tendons  unremarkable on long and transverse views without sheath effusions. Achilles tendon visualized along length of tendon and unremarkable on long and transverse views without sheath effusion. Anterior Talofibular Ligament and Calcaneofibular Ligaments unremarkable and intact. Deltoid Ligament unremarkable and intact. Plantar fascia intact and without effusion, normal thickness. Patient though calcaneus at the tip just proximal to the area of the insertion of plantar fascia has some good callus formation. Mild overlying hypoechoic changes but very minimal. Seems to be healing from previous exam.  IMPRESSION:  Calcaneal stress fracture seems to be healing.       Impression and Recommendations:     This case required medical decision making of moderate complexity.

## 2014-04-26 NOTE — Assessment & Plan Note (Signed)
Decision today to treat with OMT was based on Physical Exam  After verbal consent patient was treated with HVLA and ME techniques in , thoracic, lumbar and sacral areas  Patient tolerated the procedure well with improvement in symptoms  Patient given exercises, stretches and lifestyle modifications  See medications in patient instructions if given  Patient will follow up in 3-4 weeks                  

## 2014-04-26 NOTE — Assessment & Plan Note (Signed)
Calcaneal stress fracture shows good healing at this time with good callus formation. Patient still has increasing Doppler flow secondary to a nitroglycerin. Patient will continue to wear the pneumatic compression sleeve now. Patient will start with the elliptical but no walking or running. Discuss continuing the icing at this time. Patient and will followup and see me again in 3 weeks for further evaluation and treatment.

## 2014-04-26 NOTE — Patient Instructions (Signed)
Good to see you Continue what you are doing for your back.  Continue the nitro and probably the brace OK to start elliptical Still would ice at night.  After 2 weeks then can walk 2 times a week Come back in 3 weeks and we will make sure completely healed.

## 2014-05-15 ENCOUNTER — Ambulatory Visit (INDEPENDENT_AMBULATORY_CARE_PROVIDER_SITE_OTHER): Payer: BC Managed Care – PPO | Admitting: Family Medicine

## 2014-05-15 VITALS — BP 118/80 | HR 78 | Ht 67.0 in | Wt 171.0 lb

## 2014-05-15 DIAGNOSIS — M9981 Other biomechanical lesions of cervical region: Secondary | ICD-10-CM

## 2014-05-15 DIAGNOSIS — M79671 Pain in right foot: Secondary | ICD-10-CM

## 2014-05-15 DIAGNOSIS — M999 Biomechanical lesion, unspecified: Secondary | ICD-10-CM

## 2014-05-15 DIAGNOSIS — M79609 Pain in unspecified limb: Secondary | ICD-10-CM

## 2014-05-15 DIAGNOSIS — M545 Low back pain, unspecified: Secondary | ICD-10-CM

## 2014-05-15 NOTE — Assessment & Plan Note (Signed)
Decision today to treat with OMT was based on Physical Exam  After verbal consent patient was treated with HVLA and ME techniques in , thoracic, lumbar and sacral areas  Patient tolerated the procedure well with improvement in symptoms  Patient given exercises, stretches and lifestyle modifications  See medications in patient instructions if given  Patient will follow up in 4 weeks

## 2014-05-15 NOTE — Assessment & Plan Note (Signed)
Discussed with patient she is still having difficulty with course strength and really wants her to focus on this area. We discussed different options given home exercises the patient can do on a regular basis. We discussed the possibility of formal physical therapy which patient declined because she does travel significant amount of time for work. Patient will continue with postural changes and lifting mechanics as well. Patient and will come back and see me again in 4 weeks for further evaluation and treatment.

## 2014-05-15 NOTE — Assessment & Plan Note (Addendum)
Calcaneal stress fracture seems to be almost healed at this time. Patient encouraged to start doing more and more activity and was given a walking progression. We discussed continuing with the icing and home exercises as well as the pneumatic compression sleeve when walking long distances. Patient will continue with vitamin D supplementation. Patient will come back again and see me in 4 weeks for further evaluation. Patient is not completely pain-free we may need to consider further imaging but I think this would be highly unlikely. Patient will need likely custom orthotics and we'll discuss a followup.

## 2014-05-15 NOTE — Progress Notes (Signed)
[name] [address] [place], [place] [address] Phone: [phone] Subjective:    CC: Right heel pain she had chronic back pain follow up.   [id number] [name] is a 48 y.o. female coming in with complaint of right heel pain and chronic back pain.   right heel pain before and did have a calcaneal stress fracture.  Patient did not make any significant improvement after last visit. The patient was in a Cam Walker but for the last 3 weeks has been in a regular shoe with a pneumatic compression sleeve.. Patient has been increasing her activity and has started back at the gym. Patient states that she just has a dull pain at night but otherwise no significant pain. Patient states that she is able to do all other daily activities. Patient is fairly happy with the results.    She is also having some chronic low back pain. Patient continues to do home exercises and does respond fairly well. Patient does have a trip out of town to visit colleges with her son and is nervous that she may have some difficulty. Patient has been watching her lifting mechanics as well as postural changes at work. Has made some mild benefit. Continues to respond well to osteopathic manipulation.    Past medical history, social, surgical and family history all reviewed in electronic medical record.   Review of Systems: No headache, visual changes, nausea, vomiting, diarrhea, constipation, dizziness, abdominal pain, skin rash, fevers, chills, night sweats, weight loss, swollen lymph nodes, body aches, joint swelling, muscle aches, chest pain, shortness of breath, mood changes.   Objective Blood pressure 118/80, pulse 78, height 5\' 7"  (1.702 m), weight 171 lb (77.565 kg), SpO2 98.00%.  General: No apparent distress alert and oriented x3 mood and affect normal, dressed appropriately. Overweight HEENT: Pupils equal, extraocular movements intact  Respiratory: Patient's speak in full  sentences and does not appear short of breath  Cardiovascular: No lower extremity edema, non tender, no erythema  Skin: Warm dry intact with no signs of infection or rash on extremities or on axial skeleton.  Abdomen: Soft nontender  Neuro: Cranial nerves II through XII are intact, neurovascularly intact in all extremities with 2+ DTRs and 2+ pulses.  Lymph: No lymphadenopathy of posterior or anterior cervical chain or axillae bilaterally.  Gait antalgic with good balance and coordination.  MSK:  Non tender with full range of motion and good stability and symmetric strength and tone of shoulders, elbows, wrist, hip, knee and ankles bilaterally.  Back Exam:  Inspection: Unremarkable but poor course strength Motion: Flexion 45 deg, Extension 35 deg, Side Bending to 35 deg bilaterally,  Rotation to 35 deg bilaterally  SLR laying: Negative  XSLR laying: Negative  Palpable tenderness: mild paraspinal musculature of the lumbar spine but no spinous process tenderness  FABER: negative. Sensory change: Gross sensation intact to all lumbar and sacral dermatomes.  Reflexes: 2+ at both patellar tendons, 2+ at achilles tendons, Babinski's downgoing.  Strength at foot  Plantar-flexion: 5/5 Dorsi-flexion: 5/5 Eversion: 5/5 Inversion: 5/5  Leg strength  Quad: 5/5 Hamstring: 5/5 Hip flexor: 5/5 Hip abductors: 4/5  Gait antalgic Foot exam shows the patient does have mild pes planus bilaterally with significant over pronation of the hindfoot.  OMT Physical Exam   Standing flexion left  Seated Flexion Left Cervical  C2 flexed rotated and side bent right C4 flexed rotated inside that were  Thoracic T6 extended rotated and side bent left  T9 extended rotated and side bent right  Lumbar L2,3 flexed rotated inside that right  Sacrum Left on left  MSK US performed of: right heel   This study was ordered, performed, and interpreted by Charlann Boxer D.O.  Foot/Ankle:   All structures visualized.     Talar dome unremarkable  Ankle mortise without effusion. Peroneus longus and brevis tendons unremarkable on long and transverse views without sheath effusions. Posterior tibialis, flexor hallucis longus, and flexor digitorum longus tendons unremarkable on long and transverse views without sheath effusions. Achilles tendon visualized along length of tendon and unremarkable on long and transverse views without sheath effusion. Anterior Talofibular Ligament and Calcaneofibular Ligaments unremarkable and intact. Deltoid Ligament unremarkable and intact. Plantar fascia intact and without effusion, normal thickness. Patient though calcaneus at the tip just proximal to the area of the insertion of plantar fascia has almost completely healed at this time. Still some mild reabsorption of callus necessary. IMPRESSION:  Calcaneal stress fracture near completely healed       Impression and Recommendations:     This case required medical decision making of moderate complexity.

## 2014-05-15 NOTE — Patient Instructions (Signed)
You are doing great Continue the vitamins at this time Continue your exercises Start walking 2 times a week at 20 minutes at a time and increas 5 minutes a week for 3 weeks  Then 3 times a week but go back to 20 minutes and increase by 5 minutes weekly again [employer] train with bike and elliptical.  See you again in 3 weeks and will do orthotics

## 2014-06-07 ENCOUNTER — Ambulatory Visit: Payer: BC Managed Care – PPO | Admitting: Family Medicine

## 2014-06-10 ENCOUNTER — Ambulatory Visit (INDEPENDENT_AMBULATORY_CARE_PROVIDER_SITE_OTHER): Payer: BC Managed Care – PPO | Admitting: Family Medicine

## 2014-06-10 VITALS — BP 126/84 | HR 79 | Ht 67.0 in | Wt 168.0 lb

## 2014-06-10 DIAGNOSIS — M2142 Flat foot [pes planus] (acquired), left foot: Secondary | ICD-10-CM

## 2014-06-10 DIAGNOSIS — M9902 Segmental and somatic dysfunction of thoracic region: Secondary | ICD-10-CM

## 2014-06-10 DIAGNOSIS — M2141 Flat foot [pes planus] (acquired), right foot: Secondary | ICD-10-CM

## 2014-06-10 DIAGNOSIS — M9901 Segmental and somatic dysfunction of cervical region: Secondary | ICD-10-CM

## 2014-06-10 DIAGNOSIS — M999 Biomechanical lesion, unspecified: Secondary | ICD-10-CM

## 2014-06-10 DIAGNOSIS — M9903 Segmental and somatic dysfunction of lumbar region: Secondary | ICD-10-CM

## 2014-06-10 NOTE — Assessment & Plan Note (Signed)
Decision today to treat with OMT was based on Physical Exam  After verbal consent patient was treated with HVLA and ME techniques in , thoracic, lumbar and sacral areas  Patient tolerated the procedure well with improvement in symptoms  Patient given exercises, stretches and lifestyle modifications  See medications in patient instructions if given  Patient will follow up in 4 weeks

## 2014-06-10 NOTE — Patient Instructions (Signed)
Good to see you.  When getting the orthotics wear them for 2 hours the first day then increase 1 hour daily thereafter.  Nitro daily for 2 weeks then 3 days a week for 2 weeks then stop Come back again in 4-5 weeks.

## 2014-06-10 NOTE — Progress Notes (Signed)
[name] [address] [place], [place] [address] Phone: [phone] Subjective:    CC: Right heel pain she had chronic back pain follow up.   [id number] [name] is a 48 y.o. female coming in with complaint of right heel pain and chronic back pain.   right heel pain before and did have a calcaneal stress fracture.   Patient states that overall doing better. Patient states that she has been able to do significantly more walking. Patient continues with a nitroglycerin. No numbness. Overall seems to be doing relatively well. Patient is happy with the results of far. Patient like to increase her activity but is concerned.   She is also having some chronic low back pain. Continues to do significantly well. Patient has been responding to osteopathic manipulation. Patient continues to do biking as well as elliptical but has not started running yet. Patient is looking forward to starting this. Patient has been doing some lifting and has noticed some improvement. Patient does have a lot of traveling coming up and knows that sitting for long times can cause some aggravation of her back. No numbness or tingling.    Past medical history, social, surgical and family history all reviewed in electronic medical record.   Review of Systems: No headache, visual changes, nausea, vomiting, diarrhea, constipation, dizziness, abdominal pain, skin rash, fevers, chills, night sweats, weight loss, swollen lymph nodes, body aches, joint swelling, muscle aches, chest pain, shortness of breath, mood changes.   Objective Blood pressure 126/84, pulse 79, height 5\' 7"  (1.702 m), weight 168 lb (76.204 kg), SpO2 96.00%.  General: No apparent distress alert and oriented x3 mood and affect normal, dressed appropriately. Overweight HEENT: Pupils equal, extraocular movements intact  Respiratory: Patient's speak in full sentences and does not appear short of breath  Cardiovascular: No  lower extremity edema, non tender, no erythema  Skin: Warm dry intact with no signs of infection or rash on extremities or on axial skeleton.  Abdomen: Soft nontender  Neuro: Cranial nerves II through XII are intact, neurovascularly intact in all extremities with 2+ DTRs and 2+ pulses.  Lymph: No lymphadenopathy of posterior or anterior cervical chain or axillae bilaterally.  Gait antalgic with good balance and coordination.  MSK:  Non tender with full range of motion and good stability and symmetric strength and tone of shoulders, elbows, wrist, hip, knee and ankles bilaterally.  Back Exam:  Inspection: Unremarkable but poor course strength Motion: Flexion 45 deg, Extension 35 deg, Side Bending to 35 deg bilaterally,  Rotation to 35 deg bilaterally  SLR laying: Negative  XSLR laying: Negative  Palpable tenderness: Minimally tender of the lumbar spine today. FABER: negative. Sensory change: Gross sensation intact to all lumbar and sacral dermatomes.  Reflexes: 2+ at both patellar tendons, 2+ at achilles tendons, Babinski's downgoing.  Strength at foot  Plantar-flexion: 5/5 Dorsi-flexion: 5/5 Eversion: 5/5 Inversion: 5/5  Leg strength  Quad: 5/5 Hamstring: 5/5 Hip flexor: 5/5 Hip abductors: 4/5  Gait antalgic Foot exam shows the patient does have mild pes planus bilaterally with significant over pronation of the hindfoot.  OMT Physical Exam  Standing flexion left  Seated Flexion  Left Cervical  C2 flexed rotated and side bent right C4 flexed rotated inside that were  Thoracic T6 extended rotated and side bent left T9 extended rotated and side bent right  Lumbar L2 flexed rotated inside that right  Sacrum Left on left  MSK US performed of: right heel  This study was ordered, performed, and interpreted by Charlann Boxer D.O.  Foot/Ankle:   All structures visualized.   Talar dome unremarkable  Ankle mortise without effusion. Peroneus longus and brevis tendons unremarkable  on long and transverse views without sheath effusions. Posterior tibialis, flexor hallucis longus, and flexor digitorum longus tendons unremarkable on long and transverse views without sheath effusions. Achilles tendon visualized along length of tendon and unremarkable on long and transverse views without sheath effusion. Anterior Talofibular Ligament and Calcaneofibular Ligaments unremarkable and intact. Deltoid Ligament unremarkable and intact. Plantar fascia intact and without effusion, normal thickness. Patient though calcaneus at the tip just proximal to the area of the insertion of plantar fascia has almost completely healed at this time. Still some mild reabsorption of callus necessary. IMPRESSION:  Calcaneal stress fracture healed.   Patient was fitted for a : standard, cushioned, semi-rigid orthotic. The orthotic was heated and afterward the patient stood on the orthotic blank positioned on the orthotic stand. The patient was positioned in subtalar neutral position and 10 degrees of ankle dorsiflexion in a weight bearing stance. After completion of molding, a stable base was applied to the orthotic blank. The blank was ground to a stable position for weight bearing. Size: 9 Base: Blue EVA Additional Posting and Padding: None The patient ambulated these, and they were very comfortable.  I spent 45 minutes with this patient, greater than 50% was face-to-face time counseling regarding the below diagnosis.     Impression and Recommendations:

## 2014-06-10 NOTE — Assessment & Plan Note (Signed)
Can do believe that patient's pes planus foot bilaterally likely caused some of the discomfort. Patient was fitted with custom orthotics today. Patient will increase to wear slowly over the course of time. We discussed icing protocol as well as continuing the other regimens that were done previously. Patient will try this and come back in in 4 weeks for further manipulation.

## 2014-06-21 ENCOUNTER — Ambulatory Visit: Payer: BC Managed Care – PPO | Admitting: Family Medicine

## 2014-07-26 ENCOUNTER — Ambulatory Visit (INDEPENDENT_AMBULATORY_CARE_PROVIDER_SITE_OTHER): Payer: BC Managed Care – PPO | Admitting: Family Medicine

## 2014-07-26 VITALS — BP 124/82 | HR 85 | Ht 67.0 in | Wt 165.0 lb

## 2014-07-26 DIAGNOSIS — M545 Low back pain, unspecified: Secondary | ICD-10-CM

## 2014-07-26 DIAGNOSIS — M2142 Flat foot [pes planus] (acquired), left foot: Secondary | ICD-10-CM

## 2014-07-26 DIAGNOSIS — M2141 Flat foot [pes planus] (acquired), right foot: Secondary | ICD-10-CM

## 2014-07-26 DIAGNOSIS — M9902 Segmental and somatic dysfunction of thoracic region: Secondary | ICD-10-CM

## 2014-07-26 DIAGNOSIS — M9901 Segmental and somatic dysfunction of cervical region: Secondary | ICD-10-CM

## 2014-07-26 DIAGNOSIS — M9903 Segmental and somatic dysfunction of lumbar region: Secondary | ICD-10-CM

## 2014-07-26 DIAGNOSIS — M999 Biomechanical lesion, unspecified: Secondary | ICD-10-CM

## 2014-07-26 NOTE — Patient Instructions (Addendum)
Good to see you Continue the orthotics.  I think you are doing well overall.  Keep working on sleeping position.  Get in the gym!   See me again in 4-6 weeks.

## 2014-07-26 NOTE — Assessment & Plan Note (Signed)
Doing well in orthotics, no change.

## 2014-07-26 NOTE — Assessment & Plan Note (Signed)
Decision today to treat with OMT was based on Physical Exam  After verbal consent patient was treated with HVLA and ME techniques in , thoracic, lumbar and sacral areas  Patient tolerated the procedure well with improvement in symptoms  Patient given exercises, stretches and lifestyle modifications  See medications in patient instructions if given  Patient will follow up in 4 weeks

## 2014-07-26 NOTE — Progress Notes (Signed)
[name] [address] [place], [place] [address] Phone: [phone] Subjective:    CC: chronic back pain follow up.   [id number] [name] is a 48 y.o. female coming in with complaint of heel pain  And chronic back pain.  She has had history of chronic heel pain as well. Patient was put in orthotics secondary to the breakdown of her foot which caused a calcaneal heel fracture.  Patient states orthotics has helped significantly. Patient has been able to increase her walking. Patient states she is not is fatigued at the end of the long day.  She is also having some chronic low back pain. Continues to do significantly well. Patient has been responding to osteopathic manipulation. Has not started working on a regular basis yet. States that now though in the next couple weeks she should start going to the gym on a regular basis. Patient notices though that with the orthotics her back is not quite is tired at the in the long day. States that the chronic neck pain has improved as well. Continues to take the vitamins    Past medical history, social, surgical and family history all reviewed in electronic medical record.   Review of Systems: No headache, visual changes, nausea, vomiting, diarrhea, constipation, dizziness, abdominal pain, skin rash, fevers, chills, night sweats, weight loss, swollen lymph nodes, body aches, joint swelling, muscle aches, chest pain, shortness of breath, mood changes.   Objective Blood pressure 124/82, pulse 85, height 5\' 7"  (1.702 m), weight 165 lb (74.844 kg), SpO2 98 %.  General: No apparent distress alert and oriented x3 mood and affect normal, dressed appropriately. Overweight HEENT: Pupils equal, extraocular movements intact  Respiratory: Patient's speak in full sentences and does not appear short of breath  Cardiovascular: No lower extremity edema, non tender, no erythema  Skin: Warm dry intact with no signs of infection  or rash on extremities or on axial skeleton.  Abdomen: Soft nontender  Neuro: Cranial nerves II through XII are intact, neurovascularly intact in all extremities with 2+ DTRs and 2+ pulses.  Lymph: No lymphadenopathy of posterior or anterior cervical chain or axillae bilaterally.  Gait antalgic with good balance and coordination.  MSK:  Non tender with full range of motion and good stability and symmetric strength and tone of shoulders, elbows, wrist, hip, knee and ankles bilaterally.  Back Exam:  Inspection: Unremarkable but poor course strength Motion: Flexion 45 deg, Extension 35 deg, Side Bending to 35 deg bilaterally,  Rotation to 35 deg bilaterally  SLR laying: Negative  XSLR laying: Negative  Palpable tenderness: Decreased tenderness from previous exam FABER: negative. Sensory change: Gross sensation intact to all lumbar and sacral dermatomes.  Reflexes: 2+ at both patellar tendons, 2+ at achilles tendons, Babinski's downgoing.  Strength at foot  Plantar-flexion: 5/5 Dorsi-flexion: 5/5 Eversion: 5/5 Inversion: 5/5  Leg strength  Quad: 5/5 Hamstring: 5/5 Hip flexor: 5/5 Hip abductors: 4/5  Gait antalgic Foot exam shows the patient does have mild pes planus bilaterally with significant over pronation of the hindfoot.  OMT Physical Exam  Standing flexion left  Seated Flexion  Left Cervical  C2 flexed rotated and side bent right C4 flexed rotated and side bent left  Thoracic\ T1 extended rotated and side bent left with elevated first rib T4 extended rotated and side bent left T7 extended rotated and side bent right  Lumbar L2 flexed rotated inside that right  Sacrum Left on left  Impression and Recommendations:

## 2014-07-26 NOTE — Assessment & Plan Note (Signed)
IMproving slowly, encouraged patient to be more active overall. Patient will work more on core strengthening and continue to work with her Physiological scientist. We discussed proper lifting techniques which I think will also be beneficial. Patient will continue with the over-the-counter medications. Patient is responding well to osteopathic manipulation. Patient will continue with conservative therapy and see me again in 4 weeks.

## 2014-08-27 ENCOUNTER — Ambulatory Visit (INDEPENDENT_AMBULATORY_CARE_PROVIDER_SITE_OTHER): Payer: BC Managed Care – PPO | Admitting: Family Medicine

## 2014-08-27 ENCOUNTER — Encounter: Payer: Self-pay | Admitting: Family Medicine

## 2014-08-27 VITALS — BP 118/84 | HR 87 | Ht 67.0 in | Wt 169.0 lb

## 2014-08-27 DIAGNOSIS — M9903 Segmental and somatic dysfunction of lumbar region: Secondary | ICD-10-CM

## 2014-08-27 DIAGNOSIS — M545 Low back pain, unspecified: Secondary | ICD-10-CM

## 2014-08-27 DIAGNOSIS — M9902 Segmental and somatic dysfunction of thoracic region: Secondary | ICD-10-CM

## 2014-08-27 DIAGNOSIS — M9901 Segmental and somatic dysfunction of cervical region: Secondary | ICD-10-CM

## 2014-08-27 DIAGNOSIS — M999 Biomechanical lesion, unspecified: Secondary | ICD-10-CM

## 2014-08-27 NOTE — Patient Instructions (Addendum)
Good to see you as always Happy holidays!  For your knee try a compression sleeve from [employer] or [email or web address] size medium  Lets try 5 weeks.  Popliteus Tendinitis with Rehab Tendonitis is a condition that is characterized by inflammation of a tendon. A tendon is the soft tissue that connects muscles to the skeletal system allowing for body movements. Popliteus tendonitis affects the popliteus tendon, which connects the popliteus muscle to the thigh bone (femur) near the knee. The popliteus muscle helps bend and rotate the knee. Popliteus tendonitis is often caused by a tendon tear (strain). Strains are classified into three categories. Grade 1 strains cause pain, but the tendon is not lengthened. Grade 2 strains include a lengthened ligament due to the ligament being stretched or partially ruptured. With grade 2 strains there is still function, although the function may be diminished. Grade 3 strains are characterized by a complete tear of the tendon or muscle, and function is usually impaired. SYMPTOMS   Pain in the knee, specifically the outer (lateral) and back (posterior) portions.  Pain that worsens with use of the popliteus muscle (standing on a slightly bent knee or rotating the knee).  A crackling sound (crepitation) when the tendon is moved or touched (uncommon, except when tested just after exercising). CAUSES Popliteus tendonitis occurs when damage to the popliteus tendon elicits an inflammatory (healing) response. Popliteus tendonitis is often an overuse injury. RISK INCREASES WITH:  Activities that require extensive running or walking downhill.  Poor strength and flexibility.  Failure to warm-up properly before activity.  Flat feet. PREVENTION  Warm up and stretch properly before activity.  Allow for adequate recovery between workouts.  Maintain physical fitness:  Strength, flexibility, and endurance.  Cardiovascular fitness.  Learn and implement proper  training regimens and sports technique.  Arch supports (orthotics) for individuals with flat feet. PROGNOSIS  If treated properly, then the symptoms of popliteus tendonitis usually resolve within 6 weeks.  RELATED COMPLICATIONS   Prolonged healing time, if improperly treated or re-injured.  Recurrent symptoms that result in a chronic problem. TREATMENT  Treatment initially involves the use of ice and medication to help reduce pain and inflammation. The use of strengthening and stretching exercises may help reduce pain with activity. These exercises may be performed at home or with referral to a therapist. Many individuals find that the use of a compression bandage or a knee sleeve helps reduce symptoms. If you have flat feet, then your caregiver may recommend arch supports. It is important to learn/modify techniques for running uphill/downhill that do not aggravate your symptoms. If symptoms persist for greater than 6 months despite conservative (non-surgical) treatment, then surgery may be recommended to remove the tendon sheath (lining).  MEDICATION   If pain medication is necessary, then nonsteroidal anti-inflammatory medications, such as aspirin and ibuprofen, or other minor pain relievers, such as acetaminophen, are often recommended.  Do not take pain medication within 7 days before surgery.  Prescription pain relievers may be given if deemed necessary by your caregiver. Use only as directed and only as much as you need. HEAT AND COLD  Cold treatment (icing) relieves pain and reduces inflammation. Cold treatment should be applied for 10 to 15 minutes every 2 to 3 hours for inflammation and pain and immediately after any activity that aggravates your symptoms. Use ice packs or massage the area with a piece of ice (ice massage).  Heat treatment may be used prior to performing the stretching and strengthening activities prescribed by your  caregiver, physical therapist, or athletic trainer.  Use a heat pack or soak the injury in warm water. SEEK MEDICAL CARE IF:  Treatment seems to offer no benefit, or the condition worsens.  Any medications produce adverse side effects. EXERCISES RANGE OF MOTION (ROM) AND STRETCHING EXERCISES - Popliteus Tendinitis These exercises may help you when beginning to rehabilitate your injury. Your symptoms may resolve with or without further involvement from your physician, physical therapist or athletic trainer. While completing these exercises, remember:   Restoring tissue flexibility helps normal motion to return to the joints. This allows healthier, less painful movement and activity.  An effective stretch should be held for at least 30 seconds.  A stretch should never be painful. You should only feel a gentle lengthening or release in the stretched tissue. STRETCH - Gastroc, Standing   Place hands on wall.  Extend right / left leg, keeping the front knee somewhat bent.  Slightly point your toes inward on your back foot.  Keeping your right / left heel on the floor and your knee straight, shift your weight toward the wall, not allowing your back to arch.  You should feel a gentle stretch in the right / left calf. Hold this position for __________ seconds. Repeat __________ times. Complete this stretch __________ times per day. STRETCH - Soleus, Standing   Place hands on wall.  Extend right / left leg, keeping the other knee somewhat bent.  Slightly point your toes inward on your back foot.  Keep your right / left heel on the floor, bend your back knee, and slightly shift your weight over the back leg so that you feel a gentle stretch deep in your back calf.  Hold this position for __________ seconds. Repeat __________ times. Complete this stretch __________ times per day. STRETCH - Gastrocsoleus, Standing  Note: This exercise can place a lot of stress on your foot and ankle. Please complete this exercise only if specifically  instructed by your caregiver.   Place the ball of your right / left foot on a step, keeping your other foot firmly on the same step.  Hold on to the wall or a rail for balance.  Slowly lift your other foot, allowing your body weight to press your heel down over the edge of the step.  You should feel a stretch in your right / left calf.  Hold this position for __________ seconds.  Repeat this exercise with a slight bend in your right / left knee. Repeat __________ times. Complete this stretch __________ times per day.  STRETCH - Hamstrings, Standing  Stand or sit and extend your right / left leg, placing your foot on a chair or foot stool  Keeping a slight arch in your low back and your hips straight forward.  Lead with your chest and lean forward at the waist until you feel a gentle stretch in the back of your right / left knee or thigh. (When done correctly, this exercise requires leaning only a small distance.)  Hold this position for __________ seconds. Repeat __________ times. Complete this stretch __________ times per day. STRETCH - Hamstrings, Supine   Lie on your back. Loop a belt or towel over the ball of your right / left foot.  Straighten your right / left knee and slowly pull on the belt to raise your leg. Do not allow the right / left knee to bend. Keep your opposite leg flat on the floor.  Raise the leg until you feel a gentle  stretch behind your right / left knee or thigh. Hold this position for __________ seconds. Repeat __________ times. Complete this stretch __________ times per day.  STRETCH - Hamstrings, Doorway  Lie on your back with your right / left leg extended and resting on the wall and the opposite leg flat on the ground through the door. Initially, position your bottom farther away from the wall than the illustration shows.  Keep your right / left knee straight. If you feel a stretch behind your knee or thigh, hold this position for __________  seconds.  If you do not feel a stretch, scoot your bottom closer to the door, and hold __________ seconds. Repeat __________ times. Complete this stretch __________ times per day.  STRETCH - Quadriceps, Prone   Lie on your stomach on a firm surface, such as a bed or padded floor.  Bend your right / left knee and grasp your ankle. If you are unable to reach, your ankle or pant leg, use a belt around your foot to lengthen your reach.  Gently pull your heel toward your buttocks. Your knee should not slide out to the side. You should feel a stretch in the front of your thigh and/or knee.  Hold this position for __________ seconds. Repeat __________ times. Complete this stretch __________ times per day.  STRENGTHENING EXERCISES - Popliteus Tendinitis These exercises may help you when beginning to rehabilitate your injury. They may resolve your symptoms with or without further involvement from your physician, physical therapist or athletic trainer. While completing these exercises, remember:   Muscles can gain both the endurance and the strength needed for everyday activities through controlled exercises.  Complete these exercises as instructed by your physician, physical therapist or athletic trainer. Progress the resistance and repetitions only as guided. STRENGTH - Hamstring, Isometrics   Lie on your back on a firm surface.  Bend your right / left knee approximately __________ degrees.  Dig your heel into the surface as if you are trying to pull it toward your buttocks. Tighten the muscles in the back of your thighs to "dig" as hard as you can without increasing any pain.  Hold this position for __________ seconds.  Release the tension gradually and allow your muscle to completely relax for __________ seconds in between each exercise. Repeat __________ times. Complete this exercise __________ times per day.  STRENGTH - Hamstring, Curls   Lay on your stomach with your legs extended. (If  you lay on a bed, your feet may hang over the edge.)  Tighten the muscles in the back of your thigh to bend your right / left knee up to 90 degrees. Keep your hips flat on the bed/floor.  Hold this position for __________ seconds.  Slowly lower your leg back to the starting position. Repeat __________ times. Complete this exercise __________ times per day.  OPTIONAL ANKLE WEIGHTS: Begin with ____________________, but DO NOT exceed ____________________. Increase in1 lb/0.5 kg increments. Document Released: 08/23/2005 Document Revised: 11/15/2011 Document Reviewed: 12/05/2008 [facility] Patient Information 2015 [place], [place]. This information is not intended to replace advice given to you by your health care provider. Make sure you discuss any questions you have with your health care provider.

## 2014-08-27 NOTE — Progress Notes (Signed)
  [name] [address], [place] [address] Phone: [phone] Subjective:    CC: chronic back pain follow up.   [id number] [name] is a 48 y.o. female coming in with complaint of chronic back pain.    chronic low back pain and new thoracic back pain. Continues to do significantly well. Patient has been responding to osteopathic manipulation. Has not started working on a regular basis yet. States that now though in the next couple weeks she should start going to the gym on a regular basis. Patient notices though that with the orthotics her back is not quite is tired at the in the long day. States that the chronic neck pain has improved as well. Continues to take the vitamins  Mild left knee pain but seems to be improving.  States had a pop but getting better. Not stopping her from activity, just wanted to know what     Past medical history, social, surgical and family history all reviewed in electronic medical record.   Review of Systems: No headache, visual changes, nausea, vomiting, diarrhea, constipation, dizziness, abdominal pain, skin rash, fevers, chills, night sweats, weight loss, swollen lymph nodes, body aches, joint swelling, muscle aches, chest pain, shortness of breath, mood changes.   Objective Blood pressure 118/84, pulse 87, height 5\' 7"  (1.702 m), weight 169 lb (76.658 kg), SpO2 98 %.  General: No apparent distress alert and oriented x3 mood and affect normal, dressed appropriately. Overweight HEENT: Pupils equal, extraocular movements intact  Respiratory: Patient's speak in full sentences and does not appear short of breath  Cardiovascular: No lower extremity edema, non tender, no erythema  Skin: Warm dry intact with no signs of infection or rash on extremities or on axial skeleton.  Abdomen: Soft nontender  Neuro: Cranial nerves II through XII are intact, neurovascularly intact in all extremities with 2+ DTRs and 2+  pulses.  Lymph: No lymphadenopathy of posterior or anterior cervical chain or axillae bilaterally.  Gait antalgic with good balance and coordination.  MSK:  Non tender with full range of motion and good stability and symmetric strength and tone of shoulders, elbows, wrist, hip, knee and ankles bilaterally.  Back Exam:  Inspection: Unremarkable but poor course strength Motion: Flexion 45 deg, Extension 35 deg, Side Bending to 35 deg bilaterally,  Rotation to 35 deg bilaterally  SLR laying: Negative  XSLR laying: Negative  Palpable tenderness: Decreased tenderness from previous exam FABER: negative. Sensory change: Gross sensation intact to all lumbar and sacral dermatomes.  Reflexes: 2+ at both patellar tendons, 2+ at achilles tendons, Babinski's downgoing.  Strength at foot  Plantar-flexion: 5/5 Dorsi-flexion: 5/5 Eversion: 5/5 Inversion: 5/5  Leg strength  Quad: 5/5 Hamstring: 5/5 Hip flexor: 5/5 Hip abductors: 4/5  Gait antalgic Foot exam shows the patient does have mild pes planus bilaterally with significant over pronation of the hindfoot. No change from previous exam.   OMT Physical Exam  Standing flexion left  Seated Flexion  Left Cervical  C2 flexed rotated and side bent right C4 flexed rotated and side bent left  Thoracic\ T1 extended rotated and side bent left with elevated first rib T4 extended rotated and side bent left T7 extended rotated and side bent right  Lumbar L2 flexed rotated inside that right  Sacrum Left on left      Impression and Recommendations:

## 2014-08-27 NOTE — Assessment & Plan Note (Addendum)
Decision today to treat with OMT was based on Physical Exam  After verbal consent patient was treated with HVLA and ME techniques in , thoracic, lumbar and sacral areas  Patient tolerated the procedure well with improvement in symptoms  Patient given exercises, stretches and lifestyle modifications  See medications in patient instructions if given  Patient will follow up in 5 weeks

## 2014-08-27 NOTE — Assessment & Plan Note (Signed)
Improving well, Ice vitamins, and HEP Discussed what activities to avoid. We discussed continuing with core strengthening exercises. Patient then is going come back and see me again in 5 weeks for further evaluation and treatment.

## 2014-08-29 ENCOUNTER — Telehealth: Payer: Self-pay | Admitting: Family Medicine

## 2014-08-29 MED ORDER — MELOXICAM 15 MG PO TABS: 15.0000 mg | ORAL_TABLET | Freq: Every day | ORAL | Status: DC

## 2014-08-29 NOTE — Telephone Encounter (Signed)
Is requesting an antiinflammatory to be called in to [facility] on [address]

## 2014-08-29 NOTE — Telephone Encounter (Signed)
Refill sent in to pharmacy 

## 2014-09-04 ENCOUNTER — Ambulatory Visit: Payer: BC Managed Care – PPO | Admitting: Family Medicine

## 2014-10-03 ENCOUNTER — Ambulatory Visit: Payer: BC Managed Care – PPO | Admitting: Family Medicine

## 2014-10-18 ENCOUNTER — Encounter: Payer: Self-pay | Admitting: Family Medicine

## 2014-10-18 ENCOUNTER — Ambulatory Visit (INDEPENDENT_AMBULATORY_CARE_PROVIDER_SITE_OTHER): Payer: BLUE CROSS/BLUE SHIELD | Admitting: Family Medicine

## 2014-10-18 VITALS — BP 120/82 | HR 86 | Ht 67.0 in | Wt 169.0 lb

## 2014-10-18 DIAGNOSIS — M545 Low back pain, unspecified: Secondary | ICD-10-CM

## 2014-10-18 DIAGNOSIS — M9902 Segmental and somatic dysfunction of thoracic region: Secondary | ICD-10-CM

## 2014-10-18 DIAGNOSIS — M9903 Segmental and somatic dysfunction of lumbar region: Secondary | ICD-10-CM

## 2014-10-18 DIAGNOSIS — M999 Biomechanical lesion, unspecified: Secondary | ICD-10-CM

## 2014-10-18 DIAGNOSIS — M9901 Segmental and somatic dysfunction of cervical region: Secondary | ICD-10-CM

## 2014-10-18 NOTE — Progress Notes (Signed)
  [name] [address], [place] [address] Phone: [phone] Subjective:    CC: chronic back pain follow up.   [id number] [name] is a 49 y.o. female coming in with complaint of chronic back pain.    chronic low back pain and new thoracic back pain. Continues to do significantly well. Patient has been responding to osteopathic manipulation. Patient recently has had decrease stresses well knowing that her son has gotten into college. Patient will continue with the home exercises. Patient is continuing with the vitamins. No new symptoms at this time.  Knee pain is completely resolved.    Past medical history, social, surgical and family history all reviewed in electronic medical record.   Review of Systems: No headache, visual changes, nausea, vomiting, diarrhea, constipation, dizziness, abdominal pain, skin rash, fevers, chills, night sweats, weight loss, swollen lymph nodes, body aches, joint swelling, muscle aches, chest pain, shortness of breath, mood changes.   Objective There were no vitals taken for this visit.  General: No apparent distress alert and oriented x3 mood and affect normal, dressed appropriately. Overweight HEENT: Pupils equal, extraocular movements intact  Respiratory: Patient's speak in full sentences and does not appear short of breath  Cardiovascular: No lower extremity edema, non tender, no erythema  Skin: Warm dry intact with no signs of infection or rash on extremities or on axial skeleton.  Abdomen: Soft nontender  Neuro: Cranial nerves II through XII are intact, neurovascularly intact in all extremities with 2+ DTRs and 2+ pulses.  Lymph: No lymphadenopathy of posterior or anterior cervical chain or axillae bilaterally.  Gait antalgic with good balance and coordination.  MSK:  Non tender with full range of motion and good stability and symmetric strength and tone of shoulders, elbows, wrist, hip, knee and  ankles bilaterally.  Back Exam:  Inspection: Unremarkable but poor course strength Motion: Flexion 45 deg, Extension 35 deg, Side Bending to 35 deg bilaterally,  Rotation to 35 deg bilaterally  SLR laying: Negative  XSLR laying: Negative  Palpable tenderness: Decreased tenderness from previous exam FABER: negative. Sensory change: Gross sensation intact to all lumbar and sacral dermatomes.  Reflexes: 2+ at both patellar tendons, 2+ at achilles tendons, Babinski's downgoing.  Strength at foot  Plantar-flexion: 5/5 Dorsi-flexion: 5/5 Eversion: 5/5 Inversion: 5/5  Leg strength  Quad: 5/5 Hamstring: 5/5 Hip flexor: 5/5 Hip abductors: 4/5  Gait antalgic   OMT Physical Exam  Standing flexion left  Seated Flexion  Left Cervical  C2 flexed rotated and side bent right C4 flexed rotated and side bent left  Thoracic\ T1 extended rotated and side bent left with elevated first rib T4 extended rotated and side bent left T7 extended rotated and side bent right  Lumbar L2 flexed rotated inside that right  Sacrum Left on left  Same pattern as previously.    Impression and Recommendations:

## 2014-10-18 NOTE — Assessment & Plan Note (Signed)
Patient is doing amazingly with conservative therapy at this point. Patient encouraged to continue the same exercises and icing protocol. Patient will continue to try to work on core strength. I think patient has less stress in her life at the moment to that is helpful. Patient and will come back and see me again in 8 weeks for further evaluation and treatment.

## 2014-10-18 NOTE — Progress Notes (Signed)
Pre visit review using our clinic review tool, if applicable. No additional management support is needed unless otherwise documented below in the visit note. 

## 2014-10-18 NOTE — Patient Instructions (Addendum)
Too easy!!! You are doing great Congrats to your son.  See me again in 2 months.

## 2014-10-18 NOTE — Assessment & Plan Note (Signed)
Decision today to treat with OMT was based on Physical Exam  After verbal consent patient was treated with HVLA and ME techniques in , thoracic, lumbar and sacral areas  Patient tolerated the procedure well with improvement in symptoms  Patient given exercises, stretches and lifestyle modifications  See medications in patient instructions if given  Patient will follow up in 8 weeks

## 2014-12-13 ENCOUNTER — Ambulatory Visit: Payer: BLUE CROSS/BLUE SHIELD | Admitting: Family Medicine

## 2014-12-20 ENCOUNTER — Encounter: Payer: Self-pay | Admitting: Family Medicine

## 2014-12-20 ENCOUNTER — Ambulatory Visit (INDEPENDENT_AMBULATORY_CARE_PROVIDER_SITE_OTHER): Payer: BLUE CROSS/BLUE SHIELD | Admitting: Family Medicine

## 2014-12-20 VITALS — BP 106/76 | HR 80 | Ht 67.0 in | Wt 170.0 lb

## 2014-12-20 DIAGNOSIS — M9902 Segmental and somatic dysfunction of thoracic region: Secondary | ICD-10-CM | POA: Diagnosis not present

## 2014-12-20 DIAGNOSIS — M545 Low back pain, unspecified: Secondary | ICD-10-CM

## 2014-12-20 DIAGNOSIS — M9903 Segmental and somatic dysfunction of lumbar region: Secondary | ICD-10-CM

## 2014-12-20 DIAGNOSIS — M9901 Segmental and somatic dysfunction of cervical region: Secondary | ICD-10-CM | POA: Diagnosis not present

## 2014-12-20 DIAGNOSIS — M999 Biomechanical lesion, unspecified: Secondary | ICD-10-CM

## 2014-12-20 NOTE — Patient Instructions (Addendum)
Good to see you  Continue the exercises Remember posture on the wall and the Y-T-A exercises daily if possible.  Continue the vitamins See me again in 2-3 months. Likely see me after the trip more for me to see pictures.

## 2014-12-20 NOTE — Assessment & Plan Note (Signed)
Decision today to treat with OMT was based on Physical Exam  After verbal consent patient was treated with HVLA and ME techniques in , thoracic, lumbar and sacral areas  Patient tolerated the procedure well with improvement in symptoms  Patient given exercises, stretches and lifestyle modifications  See medications in patient instructions if given  Patient will follow up in 8-12 weeks

## 2014-12-20 NOTE — Progress Notes (Signed)
  [name] [address], [place] [address] Phone: [phone] Subjective:    CC: chronic back pain follow up.   [id number] [name] is a 49 y.o. female coming in with complaint of chronic back pain. chronic low back pain andthoracic back pain. Continues to do significantly well. Patient has been responding to osteopathic manipulation. Patient is feeling good and is looking forward to traveling this summer.   No imaging has been done.   Past medical history, social, surgical and family history all reviewed in electronic medical record.   Review of Systems: No headache, visual changes, nausea, vomiting, diarrhea, constipation, dizziness, abdominal pain, skin rash, fevers, chills, night sweats, weight loss, swollen lymph nodes, body aches, joint swelling, muscle aches, chest pain, shortness of breath, mood changes.   Objective Blood pressure 106/76, pulse 80, height 5\' 7"  (1.702 m), weight 170 lb (77.111 kg), SpO2 98 %.  General: No apparent distress alert and oriented x3 mood and affect normal, dressed appropriately. Overweight HEENT: Pupils equal, extraocular movements intact  Respiratory: Patient's speak in full sentences and does not appear short of breath  Cardiovascular: No lower extremity edema, non tender, no erythema  Skin: Warm dry intact with no signs of infection or rash on extremities or on axial skeleton.  Abdomen: Soft nontender  Neuro: Cranial nerves II through XII are intact, neurovascularly intact in all extremities with 2+ DTRs and 2+ pulses.  Lymph: No lymphadenopathy of posterior or anterior cervical chain or axillae bilaterally.  Gait antalgic with good balance and coordination.  MSK:  Non tender with full range of motion and good stability and symmetric strength and tone of shoulders, elbows, wrist, hip, knee and ankles bilaterally.  Back Exam:  Inspection: Unremarkable but poor course strength Motion: Flexion 45 deg,  Extension 35 deg, Side Bending to 35 deg bilaterally,  Rotation to 35 deg bilaterally  SLR laying: Negative  XSLR laying: Negative  Palpable tenderness: Minimally tender over the sacroiliac joint FABER: negative. Sensory change: Gross sensation intact to all lumbar and sacral dermatomes.  Reflexes: 2+ at both patellar tendons, 2+ at achilles tendons, Babinski's downgoing.  Strength at foot  Plantar-flexion: 5/5 Dorsi-flexion: 5/5 Eversion: 5/5 Inversion: 5/5  Leg strength  Quad: 5/5 Hamstring: 5/5 Hip flexor: 5/5 Hip abductors: 4/5  Gait antalgic   OMT Physical Exam    Left Cervical  C2 flexed rotated and side bent right C4 flexed rotated and side bent left  Thoracic\ T1 extended rotated and side bent left with elevated first rib T4 extended rotated and side bent left T7 extended rotated and side bent right  Lumbar L2 flexed rotated inside that right  Sacrum Left on left  Same pattern as previously.    Impression and Recommendations:

## 2014-12-20 NOTE — Assessment & Plan Note (Signed)
Patient has been working fairly hard on the core strengthening encourage her to continue to do so. Patient did have encouragement to do more the postural exercises on a regular basis. We discussed icing regimen and home exercises also discussed what activities to potentially avoid. Patient does have anti-inflammatories for breakthrough pain. Patient will continue and see me again in 2-3 months.

## 2014-12-20 NOTE — Progress Notes (Signed)
Pre visit review using our clinic review tool, if applicable. No additional management support is needed unless otherwise documented below in the visit note. 

## 2015-02-26 ENCOUNTER — Ambulatory Visit (INDEPENDENT_AMBULATORY_CARE_PROVIDER_SITE_OTHER): Payer: BLUE CROSS/BLUE SHIELD | Admitting: Family Medicine

## 2015-02-26 ENCOUNTER — Encounter: Payer: Self-pay | Admitting: Family Medicine

## 2015-02-26 VITALS — BP 118/82 | HR 76 | Ht 67.0 in | Wt 170.0 lb

## 2015-02-26 DIAGNOSIS — M9903 Segmental and somatic dysfunction of lumbar region: Secondary | ICD-10-CM | POA: Diagnosis not present

## 2015-02-26 DIAGNOSIS — M79671 Pain in right foot: Secondary | ICD-10-CM | POA: Diagnosis not present

## 2015-02-26 DIAGNOSIS — M9902 Segmental and somatic dysfunction of thoracic region: Secondary | ICD-10-CM | POA: Diagnosis not present

## 2015-02-26 DIAGNOSIS — M545 Low back pain, unspecified: Secondary | ICD-10-CM

## 2015-02-26 DIAGNOSIS — M999 Biomechanical lesion, unspecified: Secondary | ICD-10-CM

## 2015-02-26 DIAGNOSIS — M9901 Segmental and somatic dysfunction of cervical region: Secondary | ICD-10-CM | POA: Diagnosis not present

## 2015-02-26 NOTE — Patient Instructions (Addendum)
Great to see you Conitnue what you are doing The more active the better.  Have a great summer For the heel ice and duexis 3 times daily for 3 days Wear good shoe of course See me again in 4 weeks.

## 2015-02-26 NOTE — Assessment & Plan Note (Signed)
Encourage patient to continue to be very active. We discussed the importance of weight lifting as well as core strengthening exercises. Patient has exercises at home which is not doing them as regularly. Patient will try to be more active and increasing her walking. Patient come back in 4 weeks for further evaluation and treatment.

## 2015-02-26 NOTE — Assessment & Plan Note (Signed)
I believe the patient has more of a heel contusion a true stress fracture. Patient is wondered double up her vitamin D supplementation, icing protocol, and when to wear shoes and avoid being barefoot for quite a long time. We discussed the importance of staying active. Patient has any worsening symptoms should come back at a sooner date otherwise patient will follow-up in 4 weeks for further evaluation and treatment. 3 day course of anti-inflammatory's recommended.

## 2015-02-26 NOTE — Progress Notes (Signed)
Pre visit review using our clinic review tool, if applicable. No additional management support is needed unless otherwise documented below in the visit note. 

## 2015-02-26 NOTE — Progress Notes (Signed)
  [name] [address] [place], [place] [address] Phone: [phone] Subjective:    CC: chronic back pain follow up.   [id number] [name] is a 49 y.o. female coming in with complaint of chronic back pain. chronic low back pain and thoracic back pain. Patient overall continues to stay very active. Patient denies any new symptoms. Patient states as long she remembers to do the exercises she seems to be doing relatively well. Patient has been doing somewhat more yard work as well as out doors more often. Patient does come back from a trip where she was doing significant amount walking. States that her back overall seems to be doing relatively well.  She was having some right heel pain. Describes it as a dull aching sensation that hurts with activity as well as somewhat without. Not as bad as her previous problem of the calcaneal stress fracture. Patient had this last summer.     Past medical history, social, surgical and family history all reviewed in electronic medical record.   Review of Systems: No headache, visual changes, nausea, vomiting, diarrhea, constipation, dizziness, abdominal pain, skin rash, fevers, chills, night sweats, weight loss, swollen lymph nodes, body aches, joint swelling, muscle aches, chest pain, shortness of breath, mood changes.   Objective Blood pressure 118/82, pulse 76, height 5\' 7"  (1.702 m), weight 170 lb (77.111 kg), SpO2 97 %.  General: No apparent distress alert and oriented x3 mood and affect normal, dressed appropriately. Overweight HEENT: Pupils equal, extraocular movements intact  Respiratory: Patient's speak in full sentences and does not appear short of breath  Cardiovascular: No lower extremity edema, non tender, no erythema  Skin: Warm dry intact with no signs of infection or rash on extremities or on axial skeleton.  Abdomen: Soft nontender  Neuro: Cranial nerves II through XII are intact, neurovascularly  intact in all extremities with 2+ DTRs and 2+ pulses.  Lymph: No lymphadenopathy of posterior or anterior cervical chain or axillae bilaterally.  Gait normal MSK:  Non tender with full range of motion and good stability and symmetric strength and tone of shoulders, elbows, wrist, hip, knee and ankles bilaterally.   Right foot exam shows the patient is somewhat tender to palpation over the calcaneal region. No skin breakdown no erythema. 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 Back Exam:  Inspection: Unremarkable but poor course strength Motion: Flexion 45 deg, Extension 35 deg, Side Bending to 35 deg bilaterally,  Rotation to 35 deg bilaterally  SLR laying: Negative  XSLR laying: Negative  Palpable tenderness: Moderate tenderness of a sacroiliac joint FABER: negative. Sensory change: Gross sensation intact to all lumbar and sacral dermatomes.  Reflexes: 2+ at both patellar tendons, 2+ at achilles tendons, Babinski's downgoing.  Strength at foot  Plantar-flexion: 5/5 Dorsi-flexion: 5/5 Eversion: 5/5 Inversion: 5/5  Leg strength  Quad: 5/5 Hamstring: 5/5 Hip flexor: 5/5 Hip abductors: 4/5     OMT Physical Exam  Left Cervical  C2 flexed rotated and side bent right  Thoracic T1 extended rotated and side bent left with elevated first rib T4 extended rotated and side bent left T7 extended rotated and side bent right  Lumbar L2 flexed rotated inside that right  Sacrum Left on left    Impression and Recommendations:

## 2015-02-26 NOTE — Assessment & Plan Note (Signed)
Decision today to treat with OMT was based on Physical Exam  After verbal consent patient was treated with HVLA and ME techniques in , thoracic, lumbar and sacral areas  Patient tolerated the procedure well with improvement in symptoms  Patient given exercises, stretches and lifestyle modifications  See medications in patient instructions if given  Patient will follow up in 3-4 weeks

## 2015-02-28 ENCOUNTER — Ambulatory Visit: Payer: BLUE CROSS/BLUE SHIELD | Admitting: Family Medicine

## 2015-03-03 ENCOUNTER — Other Ambulatory Visit: Payer: Self-pay

## 2015-03-03 DIAGNOSIS — Z1231 Encounter for screening mammogram for malignant neoplasm of breast: Secondary | ICD-10-CM

## 2015-03-24 ENCOUNTER — Ambulatory Visit (INDEPENDENT_AMBULATORY_CARE_PROVIDER_SITE_OTHER): Payer: BLUE CROSS/BLUE SHIELD | Admitting: Family Medicine

## 2015-03-24 ENCOUNTER — Encounter: Payer: Self-pay | Admitting: Family Medicine

## 2015-03-24 VITALS — BP 118/82 | HR 86 | Wt 171.0 lb

## 2015-03-24 DIAGNOSIS — M9903 Segmental and somatic dysfunction of lumbar region: Secondary | ICD-10-CM | POA: Diagnosis not present

## 2015-03-24 DIAGNOSIS — M9901 Segmental and somatic dysfunction of cervical region: Secondary | ICD-10-CM

## 2015-03-24 DIAGNOSIS — M545 Low back pain, unspecified: Secondary | ICD-10-CM

## 2015-03-24 DIAGNOSIS — M9902 Segmental and somatic dysfunction of thoracic region: Secondary | ICD-10-CM | POA: Diagnosis not present

## 2015-03-24 DIAGNOSIS — M999 Biomechanical lesion, unspecified: Secondary | ICD-10-CM

## 2015-03-24 NOTE — Assessment & Plan Note (Signed)
Decision today to treat with OMT was based on Physical Exam  After verbal consent patient was treated with HVLA and ME techniques in , thoracic, lumbar and sacral areas  Patient tolerated the procedure well with improvement in symptoms  Patient given exercises, stretches and lifestyle modifications  See medications in patient instructions if given  Patient will follow up in 4-6 weeks

## 2015-03-24 NOTE — Progress Notes (Signed)
  [name] [address] [place], [place] [address] Phone: [phone] Subjective:    CC: chronic back pain follow up.   [id number] [name] is a 49 y.o. female coming in with complaint of chronic back pain. chronic low back pain and thoracic back pain. Patient is doing significantly better than previous exam. Patient did have manipulation was having somewhat of a flare. Patient did take anti-inflammatory's as scheduled. Patient states though that she has not needed any more for quite some time. Patient has had some more anxiety secondary to work as well as her son going to college in 4 weeks.  She was having some right heel pain. Patient states that the heel contusion she had previously is well healed at this time and 90% better. Patient continues to wear the shoes on a regular basis.     Past medical history, social, surgical and family history all reviewed in electronic medical record.   Review of Systems: No headache, visual changes, nausea, vomiting, diarrhea, constipation, dizziness, abdominal pain, skin rash, fevers, chills, night sweats, weight loss, swollen lymph nodes, body aches, joint swelling, muscle aches, chest pain, shortness of breath, mood changes.   Objective Blood pressure 118/82, pulse 86, weight 171 lb (77.565 kg), SpO2 96 %.  General: No apparent distress alert and oriented x3 mood and affect normal, dressed appropriately. Overweight HEENT: Pupils equal, extraocular movements intact  Respiratory: Patient's speak in full sentences and does not appear short of breath  Cardiovascular: No lower extremity edema, non tender, no erythema  Skin: Warm dry intact with no signs of infection or rash on extremities or on axial skeleton.  Abdomen: Soft nontender  Neuro: Cranial nerves II through XII are intact, neurovascularly intact in all extremities with 2+ DTRs and 2+ pulses.  Lymph: No lymphadenopathy of posterior or anterior cervical  chain or axillae bilaterally.  Gait normal MSK:  Non tender with full range of motion and good stability and symmetric strength and tone of shoulders, elbows, wrist, hip, knee and ankles bilaterally.   Right foot exam shows the patient is somewhat tender to palpation over the calcaneal region. No skin breakdown no erythema. 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  Back Exam:  Inspection: Unremarkable but poor course strength Motion: Flexion 45 deg, Extension 35 deg, Side Bending to 35 deg bilaterally,  Rotation to 35 deg bilaterally  SLR laying: Negative  XSLR laying: Negative  Palpable tenderness: Continued tenderness over the sacroiliac joints but less than previous exam FABER: negative. Sensory change: Gross sensation intact to all lumbar and sacral dermatomes.  Reflexes: 2+ at both patellar tendons, 2+ at achilles tendons, Babinski's downgoing.  Strength at foot  Plantar-flexion: 5/5 Dorsi-flexion: 5/5 Eversion: 5/5 Inversion: 5/5  Leg strength  Quad: 5/5 Hamstring: 5/5 Hip flexor: 5/5 Hip abductors: 4/5     OMT Physical Exam   Cervical  C2 flexed rotated and side bent right  Thoracic T1 extended rotated and side bent left with elevated first rib T4 extended rotated and side bent left T7 extended rotated and side bent right  Lumbar L2 flexed rotated inside that right  Sacrum Left on left    Impression and Recommendations:

## 2015-03-24 NOTE — Assessment & Plan Note (Signed)
Patient is doing significant better at this time. Encourage her to do the exercises on a regular basis. Discussed icing regimen. We discussed continuing to follow-up with her thyroid medications as well. Patient and will come back and see me again in 4-6 weeks for further evaluation and treatment.

## 2015-03-24 NOTE — Patient Instructions (Addendum)
Good to see you I am happy you are dong better Make no changes at this time Good luck with orientation.  Space out to 4-6 weeks.

## 2015-04-08 ENCOUNTER — Ambulatory Visit
Admission: RE | Admit: 2015-04-08 | Discharge: 2015-04-08 | Disposition: A | Discharge status: Home / Self Care | Payer: BLUE CROSS/BLUE SHIELD | Source: Ambulatory Visit

## 2015-04-08 DIAGNOSIS — Z1231 Encounter for screening mammogram for malignant neoplasm of breast: Secondary | ICD-10-CM

## 2015-05-02 ENCOUNTER — Encounter: Payer: Self-pay | Admitting: Family Medicine

## 2015-05-02 ENCOUNTER — Ambulatory Visit (INDEPENDENT_AMBULATORY_CARE_PROVIDER_SITE_OTHER): Payer: BLUE CROSS/BLUE SHIELD | Admitting: Family Medicine

## 2015-05-02 VITALS — BP 128/80 | HR 93 | Ht 67.0 in | Wt 170.0 lb

## 2015-05-02 DIAGNOSIS — M9903 Segmental and somatic dysfunction of lumbar region: Secondary | ICD-10-CM | POA: Diagnosis not present

## 2015-05-02 DIAGNOSIS — M545 Low back pain, unspecified: Secondary | ICD-10-CM

## 2015-05-02 DIAGNOSIS — M9902 Segmental and somatic dysfunction of thoracic region: Secondary | ICD-10-CM | POA: Diagnosis not present

## 2015-05-02 DIAGNOSIS — M999 Biomechanical lesion, unspecified: Secondary | ICD-10-CM

## 2015-05-02 DIAGNOSIS — M9901 Segmental and somatic dysfunction of cervical region: Secondary | ICD-10-CM | POA: Diagnosis not present

## 2015-05-02 NOTE — Assessment & Plan Note (Signed)
Decision today to treat with OMT was based on Physical Exam  After verbal consent patient was treated with HVLA and ME techniques in , thoracic, lumbar and sacral areas  Patient tolerated the procedure well with improvement in symptoms  Patient given exercises, stretches and lifestyle modifications  See medications in patient instructions if given  Patient will follow up in 3-4 weeks

## 2015-05-02 NOTE — Progress Notes (Signed)
Pre visit review using our clinic review tool, if applicable. No additional management support is needed unless otherwise documented below in the visit note. 

## 2015-05-02 NOTE — Progress Notes (Signed)
  [name] [address] [place], [place] [address] Phone: [phone] Subjective:    CC: chronic back pain follow up.   [id number] [name] is a 49 y.o. female coming in with complaint of chronic back pain. chronic low back pain and thoracic back pain. Patient states she's had some mild increase in discomfort. Over the course last 3 weeks she is cut and got out of her routine. Patient has been a little sad because her son has gone away to college. Has not been working out regularly. Not doing the exercises. Continues to forget to take the vitamins and medications. Patient is also dealing with the possibility of having a surgery.   Heel pain nearly resolved.      Past medical history, social, surgical and family history all reviewed in electronic medical record.   Review of Systems: No headache, visual changes, nausea, vomiting, diarrhea, constipation, dizziness, abdominal pain, skin rash, fevers, chills, night sweats, weight loss, swollen lymph nodes, body aches, joint swelling, muscle aches, chest pain, shortness of breath, mood changes.   Objective Blood pressure 128/80, pulse 93, height 5\' 7"  (1.702 m), weight 170 lb (77.111 kg), SpO2 97 %.  General: No apparent distress alert and oriented x3 mood and affect normal, dressed appropriately. Overweight HEENT: Pupils equal, extraocular movements intact  Respiratory: Patient's speak in full sentences and does not appear short of breath  Cardiovascular: No lower extremity edema, non tender, no erythema  Skin: Warm dry intact with no signs of infection or rash on extremities or on axial skeleton.  Abdomen: Soft nontender  Neuro: Cranial nerves II through XII are intact, neurovascularly intact in all extremities with 2+ DTRs and 2+ pulses.  Lymph: No lymphadenopathy of posterior or anterior cervical chain or axillae bilaterally.  Gait normal MSK:  Non tender with full range of motion and good  stability and symmetric strength and tone of shoulders, elbows, wrist, hip, knee and ankles bilaterally.   Right foot exam shows the patient is somewhat tender to palpation over the calcaneal region. No skin breakdown no erythema. 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  Back Exam:  Inspection: Unremarkable but poor course strength Motion: Flexion 45 deg, Extension 35 deg, Side Bending to 35 deg bilaterally,  Rotation to 35 deg bilaterally  SLR laying: Negative  XSLR laying: Negative  Palpable tenderness: Continued tenderness over the sacroiliac joints but less than previous exam FABER: negative. Sensory change: Gross sensation intact to all lumbar and sacral dermatomes.  Reflexes: 2+ at both patellar tendons, 2+ at achilles tendons, Babinski's downgoing.  Strength at foot  Plantar-flexion: 5/5 Dorsi-flexion: 5/5 Eversion: 5/5 Inversion: 5/5  Leg strength  Quad: 5/5 Hamstring: 5/5 Hip flexor: 5/5 Hip abductors: 4/5     OMT Physical Exam   Cervical  C2 flexed rotated and side bent right C4 flexed rotated and side bent left  Thoracic T1 extended rotated and side bent left with elevated first rib T4 extended rotated and side bent left T7 extended rotated and side bent right  Lumbar L2 flexed rotated inside that right  Sacrum Left on left    Impression and Recommendations:

## 2015-05-02 NOTE — Assessment & Plan Note (Signed)
Patient back pain is multifactorial. Patient has significant report core strengthening. Patient has gained weight over the course of time here as well. Encourage her to get back into the routine continue to monitor weight. Patient has not been as active as she has. Patient does have some underlying anxiety that I think is also contribute in. Patient will decide if she has not had surgery in the near future. At this point I would like to see her again in 3-4 weeks to make sure she is improving and becoming more regimen.

## 2015-05-02 NOTE — Patient Instructions (Addendum)
Good to see you and happy Friday.  Continue what you are doing  Ice can help and don't be afraid to use medicines if you need them.  Get back into the groove.  Try the change in the shoe.  See me again in 3 weeks.

## 2015-05-19 ENCOUNTER — Telehealth: Payer: Self-pay | Admitting: Family Medicine

## 2015-05-19 NOTE — Telephone Encounter (Signed)
lmovm for pt return call.

## 2015-05-19 NOTE — Telephone Encounter (Signed)
Patient called stating she cannot come to her appt @ 11 on Friday. She would like to come on Monday if possible. She is leaving on 9/23 for 2 weeks. CB# [phone]

## 2015-05-19 NOTE — Telephone Encounter (Signed)
Spoke to pt, scheduled her for 9.15.16 @ 10am.

## 2015-05-22 ENCOUNTER — Encounter: Payer: Self-pay | Admitting: Family Medicine

## 2015-05-22 ENCOUNTER — Ambulatory Visit (INDEPENDENT_AMBULATORY_CARE_PROVIDER_SITE_OTHER): Payer: BLUE CROSS/BLUE SHIELD | Admitting: Family Medicine

## 2015-05-22 VITALS — BP 122/72 | HR 90 | Ht 67.0 in | Wt 172.0 lb

## 2015-05-22 DIAGNOSIS — M9903 Segmental and somatic dysfunction of lumbar region: Secondary | ICD-10-CM | POA: Diagnosis not present

## 2015-05-22 DIAGNOSIS — M999 Biomechanical lesion, unspecified: Secondary | ICD-10-CM

## 2015-05-22 DIAGNOSIS — M9902 Segmental and somatic dysfunction of thoracic region: Secondary | ICD-10-CM | POA: Diagnosis not present

## 2015-05-22 DIAGNOSIS — M9901 Segmental and somatic dysfunction of cervical region: Secondary | ICD-10-CM

## 2015-05-22 DIAGNOSIS — M545 Low back pain, unspecified: Secondary | ICD-10-CM

## 2015-05-22 NOTE — Progress Notes (Signed)
Pre visit review using our clinic review tool, if applicable. No additional management support is needed unless otherwise documented below in the visit note. 

## 2015-05-22 NOTE — Assessment & Plan Note (Signed)
Decision today to treat with OMT was based on Physical Exam  After verbal consent patient was treated with HVLA and ME techniques in , thoracic, lumbar and sacral areas  Patient tolerated the procedure well with improvement in symptoms  Patient given exercises, stretches and lifestyle modifications  See medications in patient instructions if given  Patient will follow up in 4-5 weeks

## 2015-05-22 NOTE — Assessment & Plan Note (Signed)
Encourage patient to work on core strength again. We discussed that she needs to continue to monitor her thyroid levels. We discussed that patient needs to remain active and take care of herself. Patient has had some different life changes that I think is contributing. Patient will come back and see me again after her long trip and we will do more manipulation hopefully and consider other changes if necessary.

## 2015-05-22 NOTE — Progress Notes (Signed)
  [name] [place] [place], [place] [address] Phone: [phone] Subjective:    CC: chronic back pain follow up.   [name] is a 49 y.o. female coming in with complaint of chronic back pain. chronic low back pain and thoracic back pain. Patient had been doing very well and started to do the exercises on a more regular basis. Patient will be leaving town in one week and will be out of town for 2 weeks. Patient states overall she has noticed some more increasing neck pain. Left lower back pain. States it is more of a tightness. Does not know exactly what has brought it on. Patient states it seemed to be when she went out of town for the weekend and was sleeping in another bed. Nothing that is stopping her from activities. Still not working on a regular basis or doing the home exercises regularly.     Past medical history, social, surgical and family history all reviewed in electronic medical record.   Review of Systems: No headache, visual changes, nausea, vomiting, diarrhea, constipation, dizziness, abdominal pain, skin rash, fevers, chills, night sweats, weight loss, swollen lymph nodes, body aches, joint swelling, muscle aches, chest pain, shortness of breath, mood changes.   Objective Blood pressure 122/72, pulse 90, height 5\' 7"  (1.702 m), weight 172 lb (78.019 kg), SpO2 97 %.  General: No apparent distress alert and oriented x3 mood and affect normal, dressed appropriately. Overweight HEENT: Pupils equal, extraocular movements intact  Respiratory: Patient's speak in full sentences and does not appear short of breath  Cardiovascular: No lower extremity edema, non tender, no erythema  Skin: Warm dry intact with no signs of infection or rash on extremities or on axial skeleton.  Abdomen: Soft nontender  Neuro: Cranial nerves II through XII are intact, neurovascularly intact in all extremities with 2+ DTRs and 2+ pulses.  Lymph: No  lymphadenopathy of posterior or anterior cervical chain or axillae bilaterally.  Gait normal MSK:  Non tender with full range of motion and good stability and symmetric strength and tone of shoulders, elbows, wrist, hip, knee and ankles bilaterally.   Right foot exam still shows same findings in minimally tender to palpation.  Back Exam:  Inspection: Unremarkable but poor course strength Motion: Flexion 45 deg, Extension 35 deg, Side Bending to 35 deg bilaterally,  Rotation to 35 deg bilaterally  SLR laying: Negative  XSLR laying: Negative  Palpable tenderness: Continued tenderness over the sacroiliac joints but less than previous exam FABER: negative. Sensory change: Gross sensation intact to all lumbar and sacral dermatomes.  Reflexes: 2+ at both patellar tendons, 2+ at achilles tendons, Babinski's downgoing.  Strength at foot  Plantar-flexion: 5/5 Dorsi-flexion: 5/5 Eversion: 5/5 Inversion: 5/5  Leg strength  Quad: 5/5 Hamstring: 5/5 Hip flexor: 5/5 Hip abductors: 4/5     OMT Physical Exam   Cervical  C2 flexed rotated and side bent right C4 flexed rotated and side bent left  Thoracic T1 extended rotated and side bent left with elevated first rib T4 extended rotated and side bent left T7 extended rotated and side bent right  Lumbar L2 flexed rotated and side bent that right L4 flexed rotated and side bent left Sacrum Left on left    Impression and Recommendations:

## 2015-05-22 NOTE — Patient Instructions (Signed)
Good to see you pennsaid pinkie amount topically 2 times daily as needed.  Heat 10 ice 10 then off.  Go shoe shopping New exercises 3 times a week 3 sets of 10 reps.  See me again when back in town.   Standing:  Secure a rubber exercise band/tubing so that it is at the height of your shoulders when you are either standing or sitting on a firm arm-less chair.  Grasp an end of the band/tubing in each hand and have your palms face each other. Straighten your elbows and lift your hands straight in front of you at shoulder height. Step back away from the secured end of band/tubing until it becomes tense.  Squeeze your shoulder blades together. Keeping your elbows locked and your hands at shoulder-height, bring your hands out to your side.  Hold __________ seconds. Slowly ease the tension on the band/tubing as you reverse the directions and return to the starting position. Repeat __________ times. Complete this exercise __________ times per day. STRENGTH - Scapular Retractors  Secure a rubber exercise band/tubing so that it is at the height of your shoulders when you are either standing or sitting on a firm arm-less chair.  With a palm-down grip, grasp an end of the band/tubing in each hand. Straighten your elbows and lift your hands straight in front of you at shoulder height. Step back away from the secured end of band/tubing until it becomes tense.  Squeezing your shoulder blades together, draw your elbows back as you bend them. Keep your upper arm lifted away from your body throughout the exercise.  Hold __________ seconds. Slowly ease the tension on the band/tubing as you reverse the directions and return to the starting position. Repeat __________ times. Complete this exercise __________ times per day. STRENGTH - Shoulder Extensors   Secure a rubber exercise band/tubing so that it is at the height of your shoulders when you are either standing or sitting on a firm arm-less  chair.  With a thumbs-up grip, grasp an end of the band/tubing in each hand. Straighten your elbows and lift your hands straight in front of you at shoulder height. Step back away from the secured end of band/tubing until it becomes tense.  Squeezing your shoulder blades together, pull your hands down to the sides of your thighs. Do not allow your hands to go behind you.  Hold for __________ seconds. Slowly ease the tension on the band/tubing as you reverse the directions and return to the starting position. Repeat __________ times. Complete this exercise __________ times per day.  STRENGTH - Scapular Retractors and External Rotators  Secure a rubber exercise band/tubing so that it is at the height of your shoulders when you are either standing or sitting on a firm arm-less chair.  With a palm-down grip, grasp an end of the band/tubing in each hand. Bend your elbows 90 degrees and lift your elbows to shoulder height at your sides. Step back away from the secured end of band/tubing until it becomes tense.  Squeezing your shoulder blades together, rotate your shoulder so that your upper arm and elbow remain stationary, but your fists travel upward to head-height.  Hold __________ for seconds. Slowly ease the tension on the band/tubing as you reverse the directions and return to the starting position. Repeat __________ times. Complete this exercise __________ times per day.  STRENGTH - Scapular Retractors and External Rotators, Rowing  Secure a rubber exercise band/tubing so that it is at the height of your shoulders  when you are either standing or sitting on a firm arm-less chair.  With a palm-down grip, grasp an end of the band/tubing in each hand. Straighten your elbows and lift your hands straight in front of you at shoulder height. Step back away from the secured end of band/tubing until it becomes tense.  Step 1: Squeeze your shoulder blades together. Bending your elbows, draw your hands  to your chest as if you are rowing a boat. At the end of this motion, your hands and elbow should be at shoulder-height and your elbows should be out to your sides.  Step 2: Rotate your shoulder to raise your hands above your head. Your forearms should be vertical and your upper-arms should be horizontal.  Hold for __________ seconds. Slowly ease the tension on the band/tubing as you reverse the directions and return to the starting position. Repeat __________ times. Complete this exercise __________ times per day.  STRENGTH - Scapular Retractors and Elevators  Secure a rubber exercise band/tubing so that it is at the height of your shoulders when you are either standing or sitting on a firm arm-less chair.  With a thumbs-up grip, grasp an end of the band/tubing in each hand. Step back away from the secured end of band/tubing until it becomes tense.  Squeezing your shoulder blades together, straighten your elbows and lift your hands straight over your head.  Hold for __________ seconds. Slowly ease the tension on the band/tubing as you reverse the directions and return to the starting position. Repeat __________ times. Complete this exercise __________ times per day.  Document Released: 08/23/2005 Document Revised: 11/15/2011 Document Reviewed: 12/05/2008 [facility] Patient Information 2015 [place], [place]. This information is not intended to replace advice given to you by your health care provider. Make sure you discuss any questions you have with your health care provider.

## 2015-05-23 ENCOUNTER — Ambulatory Visit: Payer: BLUE CROSS/BLUE SHIELD | Admitting: Family Medicine

## 2015-06-11 ENCOUNTER — Encounter: Payer: Self-pay | Admitting: Family Medicine

## 2015-06-11 ENCOUNTER — Ambulatory Visit (INDEPENDENT_AMBULATORY_CARE_PROVIDER_SITE_OTHER): Payer: BLUE CROSS/BLUE SHIELD | Admitting: Family Medicine

## 2015-06-11 VITALS — BP 112/80 | HR 78 | Ht 67.0 in | Wt 178.0 lb

## 2015-06-11 DIAGNOSIS — M999 Biomechanical lesion, unspecified: Secondary | ICD-10-CM

## 2015-06-11 DIAGNOSIS — M542 Cervicalgia: Secondary | ICD-10-CM

## 2015-06-11 DIAGNOSIS — M9902 Segmental and somatic dysfunction of thoracic region: Secondary | ICD-10-CM | POA: Diagnosis not present

## 2015-06-11 DIAGNOSIS — M9901 Segmental and somatic dysfunction of cervical region: Secondary | ICD-10-CM

## 2015-06-11 DIAGNOSIS — M62838 Other muscle spasm: Secondary | ICD-10-CM

## 2015-06-11 DIAGNOSIS — M9903 Segmental and somatic dysfunction of lumbar region: Secondary | ICD-10-CM | POA: Diagnosis not present

## 2015-06-11 DIAGNOSIS — M6248 Contracture of muscle, other site: Secondary | ICD-10-CM

## 2015-06-11 MED ORDER — KETOROLAC TROMETHAMINE 60 MG/2ML IM SOLN
60.0000 mg | Freq: Once | INTRAMUSCULAR | Status: AC
  Administered 2015-06-11: 60 mg via INTRAMUSCULAR

## 2015-06-11 MED ORDER — CYCLOBENZAPRINE HCL 10 MG PO TABS: 10.0000 mg | ORAL_TABLET | Freq: Every day | ORAL | Status: DC

## 2015-06-11 MED ORDER — METHYLPREDNISOLONE ACETATE 80 MG/ML IJ SUSP
80.0000 mg | Freq: Once | INTRAMUSCULAR | Status: AC
  Administered 2015-06-11: 80 mg via INTRAMUSCULAR

## 2015-06-11 NOTE — Progress Notes (Signed)
  [name] [address], [place] [address] Phone: [phone] Subjective:    CC: chronic back pain follow up.   [id number] SATYA BOHALL is a 49 y.o. female coming in with complaint of chronic back pain. Patient states that she is having more of a neck pain. Patient states she had been doing a lot of traveling and carrying bags. Patient also did allow hiking. Patient states that starting one week ago had severe pain at night and was unable to sleep. An states that the next day she was unable to even move her head. Patient states with anti-inflammatory she has been able to increase some of her activity but still has some difficulty. Denies any radiation down the arms or any numbness or tingling. Denies any weakness. Having difficult he finding it to be comfortable. Other medical changes include patient is going through menopause at this moment.    Past medical history, social, surgical and family history all reviewed in electronic medical record.   Review of Systems: No headache, visual changes, nausea, vomiting, diarrhea, constipation, dizziness, abdominal pain, skin rash, fevers, chills, night sweats, weight loss, swollen lymph nodes, body aches, joint swelling, muscle aches, chest pain, shortness of breath, mood changes.   Objective Blood pressure 112/80, pulse 78, height 5\' 7"  (1.702 m), weight 178 lb (80.74 kg), SpO2 98 %.  General: No apparent distress alert and oriented x3 mood and affect normal, dressed appropriately. Overweight HEENT: Pupils equal, extraocular movements intact  Respiratory: Patient's speak in full sentences and does not appear short of breath  Cardiovascular: No lower extremity edema, non tender, no erythema  Skin: Warm dry intact with no signs of infection or rash on extremities or on axial skeleton.  Abdomen: Soft nontender  Neuro: Cranial nerves II through XII are intact, neurovascularly intact in all extremities with 2+  DTRs and 2+ pulses.  Lymph: No lymphadenopathy of posterior or anterior cervical chain or axillae bilaterally.  Gait normal MSK:  Non tender with full range of motion and good stability and symmetric strength and tone of shoulders, elbows, wrist, hip, knee and ankles bilaterally.    Neck: Inspection unremarkable. No palpable stepoffs. Negative Spurling's maneuver. Mild decrease in range of motion with left-sided rotation and side bending. Tender to palpation of the paraspinal musculature on the right and left side of the cervical neck. Mild pain in the trapezius bilaterally as well. Grip strength and sensation normal in bilateral hands Strength good C4 to T1 distribution No sensory change to C4 to T1 Negative Hoffman sign bilaterally Reflexes normal   OMT Physical Exam   Cervical  C2 flexed rotated and side bent right C4 flexed rotated and side bent left  Thoracic T1 extended rotated and side bent left with elevated first rib T7 extended rotated and side bent right  Lumbar L2 flexed rotated and side bent that right  Sacrum Left on left    Impression and Recommendations:

## 2015-06-11 NOTE — Progress Notes (Signed)
Pre visit review using our clinic review tool, if applicable. No additional management support is needed unless otherwise documented below in the visit note. 

## 2015-06-11 NOTE — Assessment & Plan Note (Signed)
Patient does have more of a spasm in the neck bilaterally. No signs of radicular symptoms. Patient does have some neck tightness. I do think stresses playing a role. We discussed postural changes that I think will be beneficial. We discussed icing regimen as well. Patient will try to make different changes and come back and see me again in 1 week. Continuing to have difficulty we do need to consider further workup including imaging.

## 2015-06-11 NOTE — Assessment & Plan Note (Signed)
Decision today to treat with OMT was based on Physical Exam  After verbal consent patient was treated with HVLA and ME techniques in , thoracic, lumbar and sacral areas  Patient tolerated the procedure well with improvement in symptoms  Patient given exercises, stretches and lifestyle modifications  See medications in patient instructions if given  Patient will follow up in 4-5 weeks

## 2015-06-11 NOTE — Patient Instructions (Addendum)
Good to see you For the neck likely spasm 2 injections today.  Flexeril at night  Duexis 3 times daily for 3 days.  Take it easy next 48 hours See me again in 7-10 days.

## 2015-06-12 ENCOUNTER — Ambulatory Visit: Payer: BLUE CROSS/BLUE SHIELD | Admitting: Family Medicine

## 2015-06-23 ENCOUNTER — Ambulatory Visit (INDEPENDENT_AMBULATORY_CARE_PROVIDER_SITE_OTHER): Payer: BLUE CROSS/BLUE SHIELD | Admitting: Family Medicine

## 2015-06-23 ENCOUNTER — Encounter: Payer: Self-pay | Admitting: Family Medicine

## 2015-06-23 VITALS — BP 116/82 | HR 86 | Ht 67.0 in | Wt 175.0 lb

## 2015-06-23 DIAGNOSIS — M545 Low back pain, unspecified: Secondary | ICD-10-CM

## 2015-06-23 DIAGNOSIS — M9901 Segmental and somatic dysfunction of cervical region: Secondary | ICD-10-CM | POA: Diagnosis not present

## 2015-06-23 DIAGNOSIS — M9902 Segmental and somatic dysfunction of thoracic region: Secondary | ICD-10-CM

## 2015-06-23 DIAGNOSIS — M9903 Segmental and somatic dysfunction of lumbar region: Secondary | ICD-10-CM | POA: Diagnosis not present

## 2015-06-23 DIAGNOSIS — M999 Biomechanical lesion, unspecified: Secondary | ICD-10-CM

## 2015-06-23 NOTE — Progress Notes (Signed)
  [name] [address], [place] [address] Phone: [phone] Subjective:    CC: chronic back pain follow up.   [id number] [name] is a 49 y.o. female coming in with complaint of chronic back pain. Patient is doing significantly better than last exam. Patient has noticed that pain decrease substantially. Patient is not having the tightness anymore. Started doing some of her regular activities again. Denies any radiation of pain. Patient is not taking the medications on a regular basis anymore. Continues of vitamins.    Past medical history, social, surgical and family history all reviewed in electronic medical record.   Review of Systems: No headache, visual changes, nausea, vomiting, diarrhea, constipation, dizziness, abdominal pain, skin rash, fevers, chills, night sweats, weight loss, swollen lymph nodes, body aches, joint swelling, muscle aches, chest pain, shortness of breath, mood changes.   Objective Blood pressure 116/82, pulse 86, height 5\' 7"  (1.702 m), weight 175 lb (79.379 kg), SpO2 95 %.  General: No apparent distress alert and oriented x3 mood and affect normal, dressed appropriately. Overweight HEENT: Pupils equal, extraocular movements intact  Respiratory: Patient's speak in full sentences and does not appear short of breath  Cardiovascular: No lower extremity edema, non tender, no erythema  Skin: Warm dry intact with no signs of infection or rash on extremities or on axial skeleton.  Abdomen: Soft nontender  Neuro: Cranial nerves II through XII are intact, neurovascularly intact in all extremities with 2+ DTRs and 2+ pulses.  Lymph: No lymphadenopathy of posterior or anterior cervical chain or axillae bilaterally.  Gait normal MSK:  Non tender with full range of motion and good stability and symmetric strength and tone of shoulders, elbows, wrist, hip, knee and ankles bilaterally.    Neck: Inspection unremarkable. No  palpable stepoffs. Negative Spurling's maneuver. Full range of motion Nontender on exam Grip strength and sensation normal in bilateral hands Strength good C4 to T1 distribution No sensory change to C4 to T1 Negative Hoffman sign bilaterally Reflexes normal   OMT Physical Exam   Cervical  C2 flexed rotated and side bent right  Thoracic T1 extended rotated and side bent left with elevated first rib T5 extended rotated and side bent right  Lumbar L2 flexed rotated and side bent that right  Sacrum Left on left    Impression and Recommendations:

## 2015-06-23 NOTE — Progress Notes (Signed)
Pre visit review using our clinic review tool, if applicable. No additional management support is needed unless otherwise documented below in the visit note. 

## 2015-06-23 NOTE — Assessment & Plan Note (Signed)
Patient overall seems to be doing relatively well. We discussed home exercises and core strengthening exercises. Patient will slowly increase his activity from 50% duration of the regular exercises and increased 25% week over the course the next 2 weeks. We discussed protein supplementation. Patient will continue with the vitamins and has been medications for breakthrough pain. Patient is less anxious than she was previously and getting up her motion at work. Patient will come back and see me again in 4-6 weeks for further evaluation and treatment.

## 2015-06-23 NOTE — Patient Instructions (Addendum)
Great to see you! Ice is your friend Start the regular routine again.  Continue to work on the core Lets space you out again to 4 weeks.

## 2015-06-23 NOTE — Assessment & Plan Note (Signed)
Decision today to treat with OMT was based on Physical Exam  After verbal consent patient was treated with HVLA and ME techniques in , thoracic, lumbar and sacral areas  Patient tolerated the procedure well with improvement in symptoms  Patient given exercises, stretches and lifestyle modifications  See medications in patient instructions if given  Patient will follow up in 4-5 weeks

## 2015-07-28 ENCOUNTER — Encounter: Payer: Self-pay | Admitting: Family Medicine

## 2015-07-28 ENCOUNTER — Ambulatory Visit (INDEPENDENT_AMBULATORY_CARE_PROVIDER_SITE_OTHER): Payer: BLUE CROSS/BLUE SHIELD | Admitting: Family Medicine

## 2015-07-28 VITALS — BP 128/84 | HR 95 | Ht 67.0 in | Wt 174.0 lb

## 2015-07-28 DIAGNOSIS — M9901 Segmental and somatic dysfunction of cervical region: Secondary | ICD-10-CM | POA: Diagnosis not present

## 2015-07-28 DIAGNOSIS — M6248 Contracture of muscle, other site: Secondary | ICD-10-CM | POA: Diagnosis not present

## 2015-07-28 DIAGNOSIS — M2141 Flat foot [pes planus] (acquired), right foot: Secondary | ICD-10-CM

## 2015-07-28 DIAGNOSIS — M2142 Flat foot [pes planus] (acquired), left foot: Secondary | ICD-10-CM

## 2015-07-28 DIAGNOSIS — M9902 Segmental and somatic dysfunction of thoracic region: Secondary | ICD-10-CM

## 2015-07-28 DIAGNOSIS — M9903 Segmental and somatic dysfunction of lumbar region: Secondary | ICD-10-CM | POA: Diagnosis not present

## 2015-07-28 DIAGNOSIS — M999 Biomechanical lesion, unspecified: Secondary | ICD-10-CM

## 2015-07-28 DIAGNOSIS — M62838 Other muscle spasm: Secondary | ICD-10-CM

## 2015-07-28 NOTE — Assessment & Plan Note (Signed)
Decision today to treat with OMT was based on Physical Exam  After verbal consent patient was treated with HVLA and ME techniques in , thoracic, lumbar and sacral areas  Patient tolerated the procedure well with improvement in symptoms  Patient given exercises, stretches and lifestyle modifications  See medications in patient instructions if given  Patient will follow up in 6-8 weeks

## 2015-07-28 NOTE — Progress Notes (Signed)
  [name] [address], [place] [address] Phone: [phone] Subjective:    CC: chronic back pain follow up.   [id number] [name] is a 49 y.o. female coming in with complaint of chronic back pain. Patient did have a flare couple months ago. Was doing much better since she had been doing the exercises on a regular basis. Patient states overall has been doing very well. Patient though is having some mild increase in stress with her posting Thanksgiving. Patient is cleaning the house and has noticed some mild increasing and back pain. No new symptoms. No radiation. Nothing that stopping her from regular daily activities.    Past medical history, social, surgical and family history all reviewed in electronic medical record.   Review of Systems: No headache, visual changes, nausea, vomiting, diarrhea, constipation, dizziness, abdominal pain, skin rash, fevers, chills, night sweats, weight loss, swollen lymph nodes, body aches, joint swelling, muscle aches, chest pain, shortness of breath, mood changes.   Objective Blood pressure 128/84, pulse 95, height 5\' 7"  (1.702 m), weight 174 lb (78.926 kg), SpO2 96 %.  General: No apparent distress alert and oriented x3 mood and affect normal, dressed appropriately. Overweight HEENT: Pupils equal, extraocular movements intact  Respiratory: Patient's speak in full sentences and does not appear short of breath  Cardiovascular: No lower extremity edema, non tender, no erythema  Skin: Warm dry intact with no signs of infection or rash on extremities or on axial skeleton.  Abdomen: Soft nontender  Neuro: Cranial nerves II through XII are intact, neurovascularly intact in all extremities with 2+ DTRs and 2+ pulses.  Lymph: No lymphadenopathy of posterior or anterior cervical chain or axillae bilaterally.  Gait normal MSK:  Non tender with full range of motion and good stability and symmetric strength and tone  of shoulders, elbows, wrist, hip, knee and ankles bilaterally.    Neck: Inspection unremarkable. No palpable stepoffs. Negative Spurling's maneuver. Full range of motion Nontender on exam Grip strength and sensation normal in bilateral hands Strength good C4 to T1 distribution No sensory change to C4 to T1 Negative Hoffman sign bilaterally Reflexes normal   OMT Physical Exam   Cervical  C2 flexed rotated and side bent right  Thoracic T1 extended rotated and side bent left with elevated first rib T5 extended rotated and side bent right  Lumbar L2 flexed rotated and side bent that right  Sacrum Left on left    Impression and Recommendations:

## 2015-07-28 NOTE — Progress Notes (Signed)
Pre visit review using our clinic review tool, if applicable. No additional management support is needed unless otherwise documented below in the visit note. 

## 2015-07-28 NOTE — Assessment & Plan Note (Signed)
Patient overall seems to be doing well. We discussed postural changes. We discussed proper lifting mechanics. Patient will try to do stretches as intermittently when she is trying to cleaning. Patient knows when to seek medical attention if any worsening symptoms occur. Patient and will come back and see me again in 8-12 weeks.

## 2015-07-28 NOTE — Patient Instructions (Signed)
Happy holidays! Have a great time Work on the posture and keep up with the posture and keeping shoulders back  Ice if you need it Stretch when you can See me when you need me.

## 2015-07-28 NOTE — Assessment & Plan Note (Signed)
Continue orthotics. 

## 2015-12-15 ENCOUNTER — Telehealth: Payer: Self-pay | Admitting: Family Medicine

## 2015-12-15 MED ORDER — CYCLOBENZAPRINE HCL 10 MG PO TABS: 10.0000 mg | ORAL_TABLET | Freq: Every day | ORAL | Status: DC

## 2015-12-15 NOTE — Telephone Encounter (Signed)
Pt request refill for cyclobenzaprine (FLEXERIL) 10 MG tablet to be send to [facility] in [place], pt is visiting over there at the moment.

## 2015-12-15 NOTE — Telephone Encounter (Signed)
Refill done.  

## 2016-04-01 ENCOUNTER — Other Ambulatory Visit: Payer: Self-pay | Admitting: Family Medicine

## 2016-04-01 DIAGNOSIS — Z1231 Encounter for screening mammogram for malignant neoplasm of breast: Secondary | ICD-10-CM

## 2016-04-16 ENCOUNTER — Other Ambulatory Visit: Payer: Self-pay | Admitting: Internal Medicine

## 2016-04-16 ENCOUNTER — Ambulatory Visit
Admission: RE | Admit: 2016-04-16 | Discharge: 2016-04-16 | Disposition: A | Discharge status: Home / Self Care | Payer: BLUE CROSS/BLUE SHIELD | Source: Ambulatory Visit | Attending: Family Medicine | Admitting: Family Medicine

## 2016-04-16 DIAGNOSIS — Z1231 Encounter for screening mammogram for malignant neoplasm of breast: Secondary | ICD-10-CM

## 2016-07-13 DIAGNOSIS — M791 Myalgia: Secondary | ICD-10-CM | POA: Diagnosis not present

## 2016-07-13 DIAGNOSIS — N951 Menopausal and female climacteric states: Secondary | ICD-10-CM | POA: Diagnosis not present

## 2016-07-13 DIAGNOSIS — M9901 Segmental and somatic dysfunction of cervical region: Secondary | ICD-10-CM | POA: Diagnosis not present

## 2016-07-13 DIAGNOSIS — M9902 Segmental and somatic dysfunction of thoracic region: Secondary | ICD-10-CM | POA: Diagnosis not present

## 2016-07-13 DIAGNOSIS — M9903 Segmental and somatic dysfunction of lumbar region: Secondary | ICD-10-CM | POA: Diagnosis not present

## 2016-09-06 HISTORY — PX: BREAST CYST ASPIRATION: SHX578

## 2016-09-14 DIAGNOSIS — E039 Hypothyroidism, unspecified: Secondary | ICD-10-CM | POA: Diagnosis not present

## 2016-09-14 DIAGNOSIS — E559 Vitamin D deficiency, unspecified: Secondary | ICD-10-CM | POA: Diagnosis not present

## 2016-09-14 DIAGNOSIS — K589 Irritable bowel syndrome without diarrhea: Secondary | ICD-10-CM | POA: Diagnosis not present

## 2016-09-14 DIAGNOSIS — R5383 Other fatigue: Secondary | ICD-10-CM | POA: Diagnosis not present

## 2016-09-14 DIAGNOSIS — E063 Autoimmune thyroiditis: Secondary | ICD-10-CM | POA: Diagnosis not present

## 2016-09-14 DIAGNOSIS — Z780 Asymptomatic menopausal state: Secondary | ICD-10-CM | POA: Diagnosis not present

## 2016-10-07 DIAGNOSIS — K430 Incisional hernia with obstruction, without gangrene: Secondary | ICD-10-CM | POA: Diagnosis not present

## 2016-10-07 HISTORY — PX: UMBILICAL HERNIA REPAIR: SHX196

## 2016-10-29 DIAGNOSIS — K429 Umbilical hernia without obstruction or gangrene: Secondary | ICD-10-CM | POA: Diagnosis not present

## 2016-10-29 DIAGNOSIS — K420 Umbilical hernia with obstruction, without gangrene: Secondary | ICD-10-CM | POA: Diagnosis not present

## 2016-11-02 DIAGNOSIS — E039 Hypothyroidism, unspecified: Secondary | ICD-10-CM | POA: Diagnosis not present

## 2016-11-02 DIAGNOSIS — K589 Irritable bowel syndrome without diarrhea: Secondary | ICD-10-CM | POA: Diagnosis not present

## 2016-11-02 DIAGNOSIS — E663 Overweight: Secondary | ICD-10-CM | POA: Diagnosis not present

## 2016-11-02 DIAGNOSIS — R5383 Other fatigue: Secondary | ICD-10-CM | POA: Diagnosis not present

## 2016-12-14 ENCOUNTER — Other Ambulatory Visit: Payer: Self-pay | Admitting: Obstetrics and Gynecology

## 2016-12-14 DIAGNOSIS — N390 Urinary tract infection, site not specified: Secondary | ICD-10-CM | POA: Diagnosis not present

## 2016-12-14 DIAGNOSIS — N760 Acute vaginitis: Secondary | ICD-10-CM | POA: Diagnosis not present

## 2016-12-14 DIAGNOSIS — N644 Mastodynia: Secondary | ICD-10-CM

## 2016-12-14 DIAGNOSIS — N6459 Other signs and symptoms in breast: Secondary | ICD-10-CM | POA: Diagnosis not present

## 2016-12-17 ENCOUNTER — Other Ambulatory Visit: Payer: Self-pay | Admitting: Obstetrics and Gynecology

## 2016-12-17 ENCOUNTER — Ambulatory Visit
Admission: RE | Admit: 2016-12-17 | Discharge: 2016-12-17 | Disposition: A | Discharge status: Home / Self Care | Payer: BLUE CROSS/BLUE SHIELD | Source: Ambulatory Visit | Attending: Obstetrics and Gynecology | Admitting: Obstetrics and Gynecology

## 2016-12-17 DIAGNOSIS — N644 Mastodynia: Secondary | ICD-10-CM

## 2016-12-17 DIAGNOSIS — N6001 Solitary cyst of right breast: Secondary | ICD-10-CM

## 2016-12-17 DIAGNOSIS — N6311 Unspecified lump in the right breast, upper outer quadrant: Secondary | ICD-10-CM | POA: Diagnosis not present

## 2016-12-21 ENCOUNTER — Ambulatory Visit
Admission: RE | Admit: 2016-12-21 | Discharge: 2016-12-21 | Disposition: A | Discharge status: Home / Self Care | Payer: BLUE CROSS/BLUE SHIELD | Source: Ambulatory Visit | Attending: Obstetrics and Gynecology | Admitting: Obstetrics and Gynecology

## 2016-12-21 DIAGNOSIS — N6001 Solitary cyst of right breast: Secondary | ICD-10-CM

## 2016-12-21 DIAGNOSIS — N644 Mastodynia: Secondary | ICD-10-CM

## 2016-12-22 DIAGNOSIS — E663 Overweight: Secondary | ICD-10-CM | POA: Diagnosis not present

## 2016-12-22 DIAGNOSIS — R5383 Other fatigue: Secondary | ICD-10-CM | POA: Diagnosis not present

## 2016-12-22 DIAGNOSIS — K589 Irritable bowel syndrome without diarrhea: Secondary | ICD-10-CM | POA: Diagnosis not present

## 2016-12-22 DIAGNOSIS — E559 Vitamin D deficiency, unspecified: Secondary | ICD-10-CM | POA: Diagnosis not present

## 2016-12-22 DIAGNOSIS — E039 Hypothyroidism, unspecified: Secondary | ICD-10-CM | POA: Diagnosis not present

## 2017-01-13 DIAGNOSIS — R5383 Other fatigue: Secondary | ICD-10-CM | POA: Diagnosis not present

## 2017-01-13 DIAGNOSIS — E663 Overweight: Secondary | ICD-10-CM | POA: Diagnosis not present

## 2017-01-13 DIAGNOSIS — K589 Irritable bowel syndrome without diarrhea: Secondary | ICD-10-CM | POA: Diagnosis not present

## 2017-01-13 DIAGNOSIS — Z780 Asymptomatic menopausal state: Secondary | ICD-10-CM | POA: Diagnosis not present

## 2017-02-07 DIAGNOSIS — N761 Subacute and chronic vaginitis: Secondary | ICD-10-CM | POA: Diagnosis not present

## 2017-02-07 DIAGNOSIS — Z124 Encounter for screening for malignant neoplasm of cervix: Secondary | ICD-10-CM | POA: Diagnosis not present

## 2017-03-02 ENCOUNTER — Telehealth: Payer: Self-pay | Admitting: Genetics

## 2017-03-02 ENCOUNTER — Encounter: Payer: Self-pay | Admitting: Genetics

## 2017-03-02 NOTE — Telephone Encounter (Signed)
Genetic counseling appt has been scheduled for the pt to see Ria Comment on 7/2 at 10am. Pt aware to arrive 15 minutes early. Demographics verified. Letter faxed to the referring.

## 2017-03-07 ENCOUNTER — Other Ambulatory Visit: Payer: BLUE CROSS/BLUE SHIELD

## 2017-03-07 ENCOUNTER — Encounter: Payer: Self-pay | Admitting: Genetics

## 2017-03-07 ENCOUNTER — Ambulatory Visit (HOSPITAL_BASED_OUTPATIENT_CLINIC_OR_DEPARTMENT_OTHER): Payer: BLUE CROSS/BLUE SHIELD | Admitting: Genetics

## 2017-03-07 DIAGNOSIS — Z840 Family history of diseases of the skin and subcutaneous tissue: Secondary | ICD-10-CM

## 2017-03-07 DIAGNOSIS — Z803 Family history of malignant neoplasm of breast: Secondary | ICD-10-CM

## 2017-03-07 DIAGNOSIS — Z8042 Family history of malignant neoplasm of prostate: Secondary | ICD-10-CM

## 2017-03-07 DIAGNOSIS — Z801 Family history of malignant neoplasm of trachea, bronchus and lung: Secondary | ICD-10-CM

## 2017-03-07 DIAGNOSIS — C181 Malignant neoplasm of appendix: Secondary | ICD-10-CM | POA: Insufficient documentation

## 2017-03-07 DIAGNOSIS — D490 Neoplasm of unspecified behavior of digestive system: Secondary | ICD-10-CM

## 2017-03-07 NOTE — Progress Notes (Addendum)
REFERRING PROVIDER: Dian [name], [address] [place], [place] [address]  PRIMARY PROVIDER:  Dian Queen, MD  PRIMARY REASON FOR VISIT:  1. Neoplasm of appendix   2. Family history of breast cancer   3. Family history of prostate cancer      HISTORY OF PRESENT ILLNESS:   [name], a 51 y.o. female, was seen for a [facility] cancer genetics consultation at the request of Dr. Helane [name] due to a family history of breast cancer.  [name] presents to clinic today to discuss the possibility of a hereditary predisposition to cancer, genetic testing, and to further clarify her future cancer risks, as well as potential cancer risks for family members.   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  At that time she had a colonoscopy and MRI that identified a lesion on her liver.  She was followed for 5 years with oncology at [facility] to monitor her liver lesion and any other signs of malignancy in this region.  No further neoplasm was identified.   She has had several breast cysts throughout her adulthood, and is being followed closely with MRI to monitor her breast cysts.  Recently calcifications were identified in her right breast.  She is being monitored regularly for these findings.  [name] reports having a few abnormal pap smear results and having abnormal cells lasered, and her cervix frozen after these abnormal results.      HORMONAL RISK FACTORS:  Menarche was at age 76.  First live birth at age 59.  OCP use for approximately 12-13 years.  Ovaries intact: yes.  Hysterectomy: yes.  Menopausal status: postmenopausal.  HRT use: 10 years. Colonoscopy: yes; normal. Mammogram within the last year: yes. Up to date with pelvic exams:  yes. Any excessive radiation exposure in the past:  No [name] reports that her mother took DES when she was pregnant with her.    Past Medical History:  Diagnosis Date  .  Family history of breast cancer   . Family history of prostate cancer   . Neoplasm of appendix     History reviewed. No pertinent surgical history.  Social History   Social History  . Marital status: Married    Spouse name: N/A  . Number of children: N/A  . Years of education: N/A   Social History Main Topics  . Smoking status: Never Smoker  . Smokeless tobacco: None  . Alcohol use None  . Drug use: Unknown  . Sexual activity: Not Asked   Other Topics Concern  . None   Social History Narrative  . None     FAMILY HISTORY:  We obtained a detailed, 4-generation family history.  Significant diagnoses are listed below: Family History  Problem Relation Age of Onset  . Breast cancer Mother 5       had hysterectomy at 56  . Skin cancer Brother        had a few removed over lifetime, one was on chest.  Is now 82  . Lung cancer Paternal Grandmother 45       dx. early 58's died 53  . Lung cancer Paternal Grandfather 38       dx in 5's, died at 43  . Prostate cancer Maternal Uncle 70       died shortly after in 106's.  Metastatic- spread to brain and many other locations   [name] has an 82 year-old daughter who has PCOS.  She has a 36 year-old son who has no history of cancer.  [name] has a brother who is 79, and has had a few skin cancers removed.  She is unsure if any were melanoma, and reports one was on his chest.  [name] has a 16 year-old brother who has no history of cancer.  This brother has a son and a daughter in their teens with no history of cancer.   [name] mother was diagnosed with breast cancer at the age of 20, and is now 14.  She had a hysterectomy at 4.   -[name] has a maternal uncle who is 48 with no history of cancer.  This uncle has a daughter in her lage 63's with 2 sons in their teens.  No history of cancer for these relatives.  This maternal uncle also has a son in his 22's with no children or any history of cancer.   -[name] has a maternal  uncle who was diagnosed with prostate cancer at 44.  It was metastatic, and spread to his brain and several other organs.  He died not long after diagnosis in his 21's.  This uncle had 3 daughters (patient's cousins)  -One cousin was diagnosed with breast cancer in her 36's, and is now 84.  This cousin has 2  daughters who are young children.    -One cousin is in her 59's and has a son and a daughter who are young (under the age of 50).   No history of cancer for these relatives.  -One cousin is in her 57's and has a son and daughter in their teens.  No history of cancer for  these relatives.  [name] maternal grandmother died in her 24's due to cancer, the type of cancer is unknown.  This maternal grandmother had a sister who had cancer.  Type of cancer and age of diagnosis/death is unknown.  [name] maternal grandfather died in his 35's and never had any diagnosis of cancer.  [name] maternal grandfather had a sister (patient's great aunt) who had breast cancer.  This great aunt had a daughter who had breast cancer.  Not much information is known about these relatives, and the ages and details of their breast cancer diagnosis are not known.    [name] father is 53 and has no history of cancer.  [name] has no paternal aunts/uncles.  [name] paternal grandmother had lung cancer and died at 39.  She had a history of smoking.  This paternal grandmother had 8 siblings, some of which had cancer.  Specific details are unknown.   [name] paternal grandfather had lung cancer and died at 73.  He had a history of smoking.  No information is known about his siblings and side of the family.    [name] is unaware of previous family history of genetic testing for hereditary cancer risks. Patient's maternal ancestors are of English/Irish descent, and paternal ancestors are of Native American ([place]) descent. There is No reported Ashkenazi Jewish ancestry. There is No known  consanguinity.  GENETIC COUNSELING ASSESSMENT: [name] is a 51 y.o. 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 We, therefore, discussed and recommended the following at today's visit.  DISCUSSION: We reviewed the characteristics, features and inheritance patterns of hereditary cancer syndromes. We also discussed genetic testing, including the appropriate family members to test, the process of testing, insurance coverage and turn-around-time for results. We discussed the implications of a negative, positive and/or variant of uncertain significant result. We recommended [name] pursue genetic testing for the Multi-Cancer gene panel. The Multi-Cancer Panel offered by [facility] includes sequencing and/or deletion duplication testing of the following 83 genes: ALK, APC, ATM, AXIN2,BAP1,  BARD1, BLM, BMPR1A, BRCA1, BRCA2, BRIP1, CASR, CDC73, CDH1, CDK4, CDKN1B, CDKN1C, CDKN2A (p14ARF), CDKN2A (p16INK4a), CEBPA, CHEK2, CTNNA1, DICER1, DIS3L2, EGFR (c.2369C>T, p.Thr790Met variant only), EPCAM (Deletion/duplication testing only), FH, FLCN, GATA2, GPC3, GREM1 (Promoter region deletion/duplication testing only), HOXB13 (c.251G>A, p.Gly84Glu), HRAS, KIT, MAX, MEN1, MET, MITF (c.952G>A, p.Glu318Lys variant only), MLH1, MSH2, MSH3, MSH6, MUTYH, NBN, NF1, NF2, NTHL1, PALB2, PDGFRA, PHOX2B, PMS2, POLD1, POLE, POT1, PRKAR1A, PTCH1, PTEN, RAD50, RAD51C, RAD51D, RB1, RECQL4, RET, RUNX1, SDHAF2, SDHA (sequence changes only), SDHB, SDHC, SDHD, SMAD4, SMARCA4, SMARCB1, SMARCE1, STK11, SUFU, TERT, TERT, TMEM127, TP53, TSC1, TSC2, VHL, WRN and WT1.   We discussed that [name]'s mother, who had breast cancer, or her maternal cousin would be the ideal people to do genetic testing.  [name] reports that she spoke to her mother  about testing, and she has declined to pursue genetic testing at this time.  [name] also said that she has tried reaching out to this cousin, and has been unable to get in contact with her.    We discussed that 5-10% of cancers are associated with a Hereditary cancer predisposition syndrome.  One of the most common hereditary cancer syndromes that increases breast cancer risk is called Hereditary Breast and Ovarian Cancer (HBOC) syndrome.  This syndrome is caused by mutations in the BRCA1 and BRCA2 genes.  This syndrome increases an individual's lifetime risk to develop breast, ovarian, pancreatic, prostate, and other types of cancer.  There are also many other cancer predisposition syndromes caused by mutations in several other genes.  We discussed that if she is found to have a mutation in one of these genes, it may impact future medical management recommendations such as increased cancer screenings and consideration of risk reducing surgeries.  A positive result could also have implications for the patient's family members.  A Negative result would mean we were unable to identify a hereditary component to her cancer, but does not rule out the possibility of a hereditary basis for her cancer.  There could be mutations that are undetectable by current technology, or in genes not yet tested or identified to increase cancer risk.  We could then run a risk model to see what her predicted risk to develop breast cancer is estimated to be.    We discussed the potential to find a Variant of Uncertain Significance or VUS.  These are variants that have not yet been identified as pathogenic or benign, and it is unknown if this variant is associated with increased cancer risk or if this is a normal finding.  Most VUS's are reclassified to benign or likely benign.   It should not be used to make medical management decisions. With time, we suspect the lab will determine the significance of any VUS's identified if any.    We discussed that there is limited data regarding hereditary cancer of the appendix.  There has been some data that suggests there may be an increased risk for cancer of the appendix for individuals with Lynch Syndrome and  MEN1 Syndrome.  It has also been investigated weather some BRCA1 and BRCA2 related peritoneal cancers may originate in the appendix.  However, given our current knowledge, it is not clear if her appendix neoplasm could potentially be related to a hereditary cancer predisposition syndrome or not.    Given that [name]'s mother meets medical criteria  for genetic testing (based on personal and family history of cancer), and has declined testing, [name] meets medical criteria for genetic testing. We discussed the limitations of testing a [name] unaffected with breast/ovarian cancer.  Despite that she meets criteria, she may still have an out of pocket cost. We discussed that if her out of pocket cost for testing is over $100, the laboratory will call and confirm whether she wants to proceed with testing.  If the out of pocket cost of testing is less than $100 she will be billed by the genetic testing laboratory. We discussed the maximum out of pocket cost to a patient through the lab [facility] is $250.    Based on the patient's personal and family history, the statistical modelTyrer Cusik v8,  and literature data were used to estimate her risk of developing breast cancer. These estimate her lifetime risk of developing breast cancer to be approximately 28.4% This estimation does not take into account any genetic testing results.  The patient's lifetime breast cancer risk is a preliminary estimate based on available information using one of several models endorsed by the [place] (ACS). The ACS recommends consideration of breast MRI screening as an adjunct to mammography for patients at high risk (defined as 20% or greater lifetime risk). A more detailed breast cancer risk  assessment can be considered, if clinically indicated.     [name] has been determined to be at high risk for breast cancer.  Therefore, we recommend that annual screening with mammography and breast MRI begin at age 51, or 10 years prior to the age of breast cancer diagnosis in a relative (whichever is earlier).  We discussed that Ms. Conly should discuss her individual situation with her referring physician and determine a breast cancer screening plan with which they are both comfortable.    PLAN: After considering the risks, benefits, and limitations, Ms. Bembenek  provided informed consent to pursue genetic testing and the blood sample was sent to [facility] for analysis of the Multi-Cancer 83 gene panel. Results should be available within approximately 2-3 weeks' time, at which point they will be disclosed by telephone to Ms. Impson, as will any additional recommendations warranted by these results. Ms. Leclere will receive a summary of her genetic counseling visit and a copy of her results once available. This information will also be available in Epic. We encouraged Ms. Macias to remain in contact with cancer genetics annually so that we can continuously update the family history and inform her of any changes in cancer genetics and testing that may be of benefit for her family. Ms. Boxell questions were answered to her satisfaction today. Our contact information was provided should additional questions or concerns arise.  Based on Ms. Davlin's family history, we recommended her mother, who was diagnosed with breast cancer at age 56, have genetic counseling and testing.  Additionally, [name]'s maternal cousin who was diagnosed with breast cancer at in her 6's is also recommended to have genetic counseling and testing.  Ms. Nicholls will let us know if we can be of any assistance in coordinating genetic counseling and/or testing for this  family member.   Lastly, we encouraged Ms. Schwake to remain in contact  with cancer genetics annually so that we can continuously update the family history and inform her of any changes in cancer genetics and testing that may be of benefit for this family.   Ms.  Bigford questions were answered to her satisfaction today. Our contact information was provided should additional questions or concerns arise. Thank you for the referral and allowing [place] to share in the care of your patient.   Tana Felts, MS Genetic Counselor [email or web address] phone: [phone]  The patient was seen for a total of 60 minutes in face-to-face genetic counseling.

## 2017-03-14 ENCOUNTER — Other Ambulatory Visit: Payer: Self-pay | Admitting: Obstetrics and Gynecology

## 2017-03-14 DIAGNOSIS — R5381 Other malaise: Secondary | ICD-10-CM

## 2017-03-17 ENCOUNTER — Other Ambulatory Visit: Payer: Self-pay | Admitting: Obstetrics and Gynecology

## 2017-03-17 DIAGNOSIS — R921 Mammographic calcification found on diagnostic imaging of breast: Secondary | ICD-10-CM

## 2017-03-18 DIAGNOSIS — E663 Overweight: Secondary | ICD-10-CM | POA: Diagnosis not present

## 2017-03-18 DIAGNOSIS — E559 Vitamin D deficiency, unspecified: Secondary | ICD-10-CM | POA: Diagnosis not present

## 2017-03-18 DIAGNOSIS — R5383 Other fatigue: Secondary | ICD-10-CM | POA: Diagnosis not present

## 2017-03-18 DIAGNOSIS — E039 Hypothyroidism, unspecified: Secondary | ICD-10-CM | POA: Diagnosis not present

## 2017-04-01 ENCOUNTER — Encounter: Payer: Self-pay | Admitting: Genetics

## 2017-04-01 ENCOUNTER — Telehealth: Payer: Self-pay | Admitting: Genetics

## 2017-04-01 ENCOUNTER — Ambulatory Visit: Payer: Self-pay | Admitting: Genetics

## 2017-04-01 DIAGNOSIS — Z1379 Encounter for other screening for genetic and chromosomal anomalies: Secondary | ICD-10-CM | POA: Insufficient documentation

## 2017-04-01 DIAGNOSIS — Z8042 Family history of malignant neoplasm of prostate: Secondary | ICD-10-CM

## 2017-04-01 DIAGNOSIS — D490 Neoplasm of unspecified behavior of digestive system: Secondary | ICD-10-CM

## 2017-04-01 DIAGNOSIS — Z803 Family history of malignant neoplasm of breast: Secondary | ICD-10-CM

## 2017-04-01 NOTE — Progress Notes (Signed)
HPI: Ms. Wickard was previously seen in the [facility] on 03/07/2017 due to a personal history of appendiceal cancer and a family history of breast and metastatic prostate cancer as well as concerns regarding a hereditary predisposition to cancer. Please refer to our prior cancer genetics clinic note for more information regarding Ms. Chalfin's medical, social and family histories, and our assessment and recommendations, at the time. Ms. Bazzano recent genetic test results were disclosed to her, as well as recommendations warranted by these results. These results and recommendations are discussed in more detail below.  FAMILY HISTORY:  We obtained a detailed, 4-generation family history.  Significant diagnoses are listed below: Family History  Problem Relation Age of Onset  . Breast cancer Mother 19       had hysterectomy at 86  . Skin cancer Brother        had a few removed over lifetime, one was on chest.  Is now 88  . Lung cancer Paternal Grandmother 28       dx. early 51's died 68  . Lung cancer Paternal Grandfather 93       dx in 21's, died at 81  . Prostate cancer Maternal Uncle 70       died shortly after in 73's.  Metastatic- spread to brain and many other locations   Ms. Reyburn has an 64 year-old daughter who has PCOS.  She has a 9 year-old son who has no history of cancer.  Ms. Lange has a brother who is 2, and has had a few skin cancers removed.  She is unsure if any were melanoma, and reports one was on his chest.  Ms. Cervi has a 62 year-old brother who has no history of cancer.  This brother has a son and a daughter in their teens with no history of cancer.   Ms. Volkert mother was diagnosed with breast cancer at the age of 53, and is now 44.  She had a hysterectomy at 47.   -Ms. Felber has a maternal uncle who is 79 with no history of cancer.  This uncle has a daughter in her lage 8's with 2 sons in their teens.  No history of cancer for these relatives.  This maternal  uncle also has a son in his 11's with no children or any history of cancer.   -Ms Rash has a maternal uncle who was diagnosed with prostate cancer at 81.  It was metastatic, and spread to his brain and several other organs.  He died not long after diagnosis in his 47's.  This uncle had 3 daughters (patient's cousins)             -One cousin was diagnosed with breast cancer in her 19's, and is now 66.  This cousin has 2   daughters who are young children.               -One cousin is in her 37's and has a son and a daughter who are young (under the age of 30).            No history of cancer for these relatives.             -One cousin is in her 58's and has a son and daughter in their teens.  No history of cancer for   these relatives.  Ms. Elder Negus maternal grandmother died in her 89's due to cancer, the type of cancer is unknown.  This maternal  grandmother had a sister who had cancer.  Type of cancer and age of diagnosis/death is unknown.  Ms. Elder Negus maternal grandfather died in his 67's and never had any diagnosis of cancer.  Ms. Elder Negus maternal grandfather had a sister (patient's great aunt) who had breast cancer.  This great aunt had a daughter who had breast cancer.  Not much information is known about these relatives, and the ages and details of their breast cancer diagnosis are not known.    Ms. Elder Negus father is 40 and has no history of cancer.  Ms. Rash has no paternal aunts/uncles.  Ms. Elder Negus paternal grandmother had lung cancer and died at 76.  She had a history of smoking.  This paternal grandmother had 8 siblings, some of which had cancer.  Specific details are unknown.   Ms. Elder Negus paternal grandfather had lung cancer and died at 69.  He had a history of smoking.  No information is known about his siblings and side of the family.    Ms. Spader is unaware of previous family history of genetic testing for hereditary cancer risks. Patient's maternal ancestors are of English/Irish descent, and  paternal ancestors are of Native American ([place]rokee) descent. There is No reported Ashkenazi Jewish ancestry. There is No known consanguinity.  GENETIC TEST RESULTS: Genetic testing performed through [facility]'s  Multi-Cancers Panel  reported out on 03/30/2017 showed no pathogenic mutations. The Multi-Cancer Panel offered by [facility] includes sequencing and/or deletion duplication testing of the following 83 genes: ALK, APC, ATM, AXIN2,BAP1,  BARD1, BLM, BMPR1A, BRCA1, BRCA2, BRIP1, CASR, CDC73, CDH1, CDK4, CDKN1B, CDKN1C, CDKN2A (p14ARF), CDKN2A (p16INK4a), CEBPA, CHEK2, CTNNA1, DICER1, DIS3L2, EGFR (c.2369C>T, p.Thr790Met variant only), EPCAM (Deletion/duplication testing only), FH, FLCN, GATA2, GPC3, GREM1 (Promoter region deletion/duplication testing only), HOXB13 (c.251G>A, p.Gly84Glu), HRAS, KIT, MAX, MEN1, MET, MITF (c.952G>A, p.Glu318Lys variant only), MLH1, MSH2, MSH3, MSH6, MUTYH, NBN, NF1, NF2, NTHL1, PALB2, PDGFRA, PHOX2B, PMS2, POLD1, POLE, POT1, PRKAR1A, PTCH1, PTEN, RAD50, RAD51C, RAD51D, RB1, RECQL4, RET, RUNX1, SDHAF2, SDHA (sequence changes only), SDHB, SDHC, SDHD, SMAD4, SMARCA4, SMARCB1, SMARCE1, STK11, SUFU, TERC, TERT, TMEM127, TP53, TSC1, TSC2, VHL, WRN and WT1. .  2 variants of uncertain significance (VUS) in the genes BLMc.437A>T  and SMARCE1 c.938G>A were also noted.   The test report will be scanned into EPIC and will be located under the Molecular Pathology section of the Results Review tab.A portion of the result report is included below for reference.       We discussed with Ms. Oguin that because current genetic testing is not perfect, it is possible there may be a gene mutation in one of these genes that current testing cannot detect, but that chance is small. We also discussed, that there could be another gene that has not yet been discovered, or that we have not yet tested, that is responsible for the cancer diagnoses in the family. Therefore, it is important to remain in  touch with cancer genetics in the future so that we can continue to offer Ms. Blew the most up to date genetic testing.   Regarding the VUS's in BLM and SMARCE1: At this time, it is unknown if these variants are associated with increased cancer risk or if they are normal findings, but most variants such as these get reclassified to being inconsequential. They should not be used to make medical management decisions. With time, we suspect the lab will determine the significance of these variants, if any. If we do learn more about it, we will try to  contact Ms. Olarte to discuss it further. However, it is important to stay in touch with [place] periodically and keep the address and phone number up to date.  ADDITIONAL GENETIC TESTING: We discussed with Ms. Woolen that her genetic testing was fairly extensive.  If in the future there are other genes associated with cancer to test and Ms. Fickel would like to pursue more genetic testing we would be happy to help coordinate this testing.    CANCER SCREENING RECOMMENDATIONS:  This normal result indicates that it is unlikely Ms. Zylka has an increased risk of cancer due to a mutation in one of these genes.  Therefore, Ms. Lagerquist was advised to continue following the cancer screening guidelines provided by her physicians and primary healthcare providers. Other factors such as her personal and family history may still affect her cancer risk.    We do not know the cause for her appendiceal cancer.  We also cannot explain why there is a history of cancer in her family.  There could be a hereditary cause in her family that she simply did not inherit.  This genetic testing also does not rule out the possibility that there could be a hereditary component to her cancer history.  There could be mutations that are not detectable by current technology or in genes not yet tested or identified to increase cancer risk.    Based on the Ms. Cobarrubias's personal and family history of cancer, as  well as her genetic test results, the statistical model Tyrer Cusik  and literature data was used to estimate her risk of developing breast cancer. This estimates her lifetime risk of developing breast cancer to be approximately 28.0% (based on most recent breast density in April 2018).  The patient's lifetime breast cancer risk is a preliminary estimate based on available information using one of several models endorsed by the [place] (ACS). The ACS recommends consideration of breast MRI screening as an adjunct to mammography for patients at high risk (defined as 20% or greater lifetime risk). A more detailed breast cancer risk assessment can be considered, if clinically indicated.   Ms. Scalia has been determined to be at high risk for breast cancer.  Therefore, we recommend that annual screening with mammography and breast MRI begin at age 51, or 10 years prior to the age of breast cancer diagnosis in a relative (whichever is earlier).  We discussed that Ms. Fowers should discuss her individual situation with her referring physician and determine a breast cancer screening plan with which they are both comfortable.  She said she has already discussed this with her referring provider Dr. Helane Rima and is being followed by her for this high breast cancer risk.      RECOMMENDATIONS FOR FAMILY MEMBERS: Women in this family might be at some increased risk of developing cancer, over the general population risk, simply due to the family history of cancer. We recommended women in this family have a yearly mammogram beginning at age 18, or 39 years younger than the earliest onset of cancer, an annual clinical breast exam, and perform monthly breast self-exams. Women in this family should also have a gynecological exam as recommended by their primary provider. All family members should have a colonoscopy by age 28.  Based on Ms. Vantil's family history, we recommended her cousin, who was diagnosed with  breast cancer in her 38's, have genetic counseling and testing.  Additionally her mother who was diagnosed with breast cancer is also recommended to  have genetic counseling and genetic testing.  If these relatives decline genetic testing at this time, then Ms. Upham's siblings and other maternal relatives would meet be recommended to have genetic counseling and genetic testing.  Ms. Milius will let us know if we can be of any assistance in coordinating genetic counseling and/or testing for this family member.   FOLLOW-UP: Lastly, we discussed with Ms. Llorente that cancer genetics is a rapidly advancing field and it is possible that new genetic tests will be appropriate for her and/or her family members in the future. We encouraged her to remain in contact with cancer genetics on an annual basis so we can update her personal and family histories and let her know of advances in cancer genetics that may benefit this family.   Our contact number was provided. Ms. Crabtree questions were answered to her satisfaction, and she knows she is welcome to call us at anytime with additional questions or concerns.   Ferol Luz, MS Genetic Counselor [email or web address]

## 2017-04-04 NOTE — Telephone Encounter (Signed)
Revealed negative genetic testing.  disclosed that a VUS in BLM and SMARCE1 were identified.  These variants should not be used to make medical management decisions.  Discussed that we do not know why she had appendiceal cancer or why there is cancer in the family. It could be due to a different gene that we are not testing, or maybe our current technology may not be able to pick something up.  It will be important for her to keep in contact with genetics to keep up with whether additional testing may be needed.  Discussed other relatives who would be recommended to have genetic testing.

## 2017-04-07 ENCOUNTER — Encounter: Payer: Self-pay | Admitting: Gastroenterology

## 2017-04-14 DIAGNOSIS — R5383 Other fatigue: Secondary | ICD-10-CM | POA: Diagnosis not present

## 2017-04-14 DIAGNOSIS — R6882 Decreased libido: Secondary | ICD-10-CM | POA: Diagnosis not present

## 2017-04-14 DIAGNOSIS — N951 Menopausal and female climacteric states: Secondary | ICD-10-CM | POA: Diagnosis not present

## 2017-04-14 DIAGNOSIS — E663 Overweight: Secondary | ICD-10-CM | POA: Diagnosis not present

## 2017-04-14 DIAGNOSIS — E039 Hypothyroidism, unspecified: Secondary | ICD-10-CM | POA: Diagnosis not present

## 2017-04-18 ENCOUNTER — Ambulatory Visit
Admission: RE | Admit: 2017-04-18 | Discharge: 2017-04-18 | Disposition: A | Discharge status: Home / Self Care | Payer: BLUE CROSS/BLUE SHIELD | Source: Ambulatory Visit | Attending: Obstetrics and Gynecology | Admitting: Obstetrics and Gynecology

## 2017-04-18 ENCOUNTER — Other Ambulatory Visit: Payer: Self-pay | Admitting: Obstetrics and Gynecology

## 2017-04-18 DIAGNOSIS — R921 Mammographic calcification found on diagnostic imaging of breast: Secondary | ICD-10-CM | POA: Diagnosis not present

## 2017-04-20 ENCOUNTER — Ambulatory Visit (INDEPENDENT_AMBULATORY_CARE_PROVIDER_SITE_OTHER): Payer: BLUE CROSS/BLUE SHIELD | Admitting: Family Medicine

## 2017-04-20 ENCOUNTER — Encounter: Payer: Self-pay | Admitting: Family Medicine

## 2017-04-20 VITALS — BP 122/80 | HR 91 | Ht 67.0 in | Wt 170.0 lb

## 2017-04-20 DIAGNOSIS — M545 Low back pain: Secondary | ICD-10-CM | POA: Diagnosis not present

## 2017-04-20 DIAGNOSIS — M999 Biomechanical lesion, unspecified: Secondary | ICD-10-CM | POA: Diagnosis not present

## 2017-04-20 DIAGNOSIS — G8929 Other chronic pain: Secondary | ICD-10-CM | POA: Diagnosis not present

## 2017-04-20 MED ORDER — VITAMIN D (ERGOCALCIFEROL) 1.25 MG (50000 UNIT) PO CAPS: 50000.0000 [IU] | ORAL_CAPSULE | ORAL | 0 refills | Status: DC

## 2017-04-20 NOTE — Assessment & Plan Note (Signed)
Decision today to treat with OMT was based on Physical Exam  After verbal consent patient was treated with HVLA, ME, FPR techniques in cervical, thoracic, lumbar and sacral areas  Patient tolerated the procedure well with improvement in symptoms  Patient given exercises, stretches and lifestyle modifications  See medications in patient instructions if given  Patient will follow up in 4 weeks 

## 2017-04-20 NOTE — Progress Notes (Signed)
[name] [address] [place], [place] [address] Phone: [phone] Subjective:    I'm seeing this patient by the request  of:    CC: Neck and back pain  [id number]  [name] is a 51 y.o. female coming in with complaint of neck and back pain. Patient will been seen for greater than 2 years. Patient states having some mild back pain. Has responded well to manipulation in the past. Patient states some overall has started having increasing discomfort again. Patient describes the pain as a dull, throbbing aching sensation. Patient states that it is starting to affect her daily activities stopping her from working out. Patient has gained weight over the course of time and wants to start being more active. Patient is going to start doing the ketogenic diet. He is somewhat motivated for this.      Past Medical History:  Diagnosis Date  . Family history of breast cancer   . Family history of prostate cancer   . Neoplasm of appendix    No past surgical history on file. Social History   Social History  . Marital status: Married    Spouse name: N/A  . Number of children: N/A  . Years of education: N/A   Social History Main Topics  . Smoking status: Never Smoker  . Smokeless tobacco: Never Used  . Alcohol use None  . Drug use: Unknown  . Sexual activity: Not Asked   Other Topics Concern  . None   Social History Narrative  . None   Not on File Family History  Problem Relation Age of Onset  . Breast cancer Mother 49       had hysterectomy at 4  . Skin cancer Brother        had a few removed over lifetime, one was on chest.  Is now 28  . Lung cancer Paternal Grandmother 84       dx. early 56's died 4  . Lung cancer Paternal Grandfather 84       dx in 68's, died at 2  . Prostate cancer Maternal Uncle 70       died shortly after in 21's.  Metastatic- spread to brain and many other locations     Past medical history, social,  surgical and family history all reviewed in electronic medical record.  No pertanent information unless stated regarding to the chief complaint.   Review of Systems:Review of systems updated and as accurate as of 04/20/17  No headache, visual changes, nausea, vomiting, diarrhea, constipation, dizziness, abdominal pain, skin rash, fevers, chills, night sweats, weight loss, swollen lymph nodes, body aches, joint swelling, muscle aches, chest pain, shortness of breath, mood changes. + muscle ache.   Objective  Blood pressure 122/80, pulse 91, height 5\' 7"  (1.702 m), weight 170 lb (77.1 kg), SpO2 98 %. Systems examined below as of 04/20/17   General: No apparent distress alert and oriented x3 mood and affect normal, dressed appropriately.  HEENT: Pupils equal, extraocular movements intact  Respiratory: Patient's speak in full sentences and does not appear short of breath  Cardiovascular: No lower extremity edema, non tender, no erythema  Skin: Warm dry intact with no signs of infection or rash on extremities or on axial skeleton.  Abdomen: Soft nontender  Neuro: Cranial nerves II through XII are intact, neurovascularly intact in all extremities with 2+ DTRs and 2+ pulses.  Lymph: No lymphadenopathy of posterior or anterior cervical chain or axillae bilaterally.  Gait normal with good balance and coordination.  MSK:  Non tender with full range of motion and good stability and symmetric strength and tone of shoulders, elbows, wrist, hip, knee and ankles bilaterally.  Neck: Inspection unremarkable. No palpable stepoffs. Negative Spurling's maneuver. Lacks last 5 of side bending bilaterally Grip strength and sensation normal in bilateral hands Strength good C4 to T1 distribution No sensory change to C4 to T1 Negative Hoffman sign bilaterally Reflexes normal  Back Exam:  Inspection: Unremarkable  Motion: Flexion 45 deg, Extension 25 deg, Side Bending to 35 deg bilaterally,  Rotation to 35  deg bilaterally  SLR laying: Negative  XSLR laying: Negative  Palpable tenderness: TTP in paraspinal musculature discussed icing regimen and home exercises.Marland Kitchen FABER: Tightness bilaterally. Sensory change: Gross sensation intact to all lumbar and sacral dermatomes.  Reflexes: 2+ at both patellar tendons, 2+ at achilles tendons, Babinski's downgoing.  Strength at foot  Plantar-flexion: 5/5 Dorsi-flexion: 5/5 Eversion: 5/5 Inversion: 5/5  Leg strength  Quad: 5/5 Hamstring: 5/5 Hip flexor: 5/5 Hip abductors: 5/5  Gait unremarkable.  Osteopathic findings C2 flexed rotated and side bent right C4 flexed rotated and side bent left C7 flexed rotated and side bent left T3 extended rotated and side bent right inhaled third rib T11 extended rotated and side bent left L3 flexed rotated and side bent right Sacrum right on right     Impression and Recommendations:     This case required medical decision making of moderate complexity.      Note: This dictation was prepared with Dragon dictation along with smaller phrase technology. Any transcriptional errors that result from this process are unintentional.

## 2017-04-20 NOTE — Patient Instructions (Addendum)
Good to see you  [name] is your friend.  Once weekly vitamin D for 12 weeks.  Exercises 3 times a week.  Tart cherry extract any dose at night could help a lot.  The diet sounds good but we will need to watch what else to do when you come back to more diverse foods See me again in 4 weeks.

## 2017-04-20 NOTE — Assessment & Plan Note (Signed)
Multifactorial. Patient does have significant weakness and core strength. Discussed with patient at great length. We discussed icing regimen, home exercises as well as which activities. Patient didn't respond well to manipulation. Follow-up again in 4 weeks for further evaluation. Once weekly vitamin D given.

## 2017-04-21 ENCOUNTER — Other Ambulatory Visit: Payer: Self-pay | Admitting: Obstetrics and Gynecology

## 2017-04-21 DIAGNOSIS — Z9189 Other specified personal risk factors, not elsewhere classified: Secondary | ICD-10-CM

## 2017-05-25 DIAGNOSIS — E039 Hypothyroidism, unspecified: Secondary | ICD-10-CM | POA: Diagnosis not present

## 2017-05-25 DIAGNOSIS — N951 Menopausal and female climacteric states: Secondary | ICD-10-CM | POA: Diagnosis not present

## 2017-05-25 DIAGNOSIS — R5383 Other fatigue: Secondary | ICD-10-CM | POA: Diagnosis not present

## 2017-05-25 DIAGNOSIS — E663 Overweight: Secondary | ICD-10-CM | POA: Diagnosis not present

## 2017-05-25 DIAGNOSIS — E559 Vitamin D deficiency, unspecified: Secondary | ICD-10-CM | POA: Diagnosis not present

## 2017-05-31 NOTE — Progress Notes (Signed)
[name] [address] [place], [place] [address] Phone: [phone] Subjective:    I'm seeing this patient by the request  of:    CC: Neck and back pain f/u   [id number]  [name] is a 51 y.o. female coming in with complaint of neck and back pain. Patient is having worsening pain more on the left side of the upper back. Has responded well to osteopathic manipular relation previously. We discussed icing regimen. Patient has been doing them intermittently. Patient denies any radiation of pain, denies numbness.  Patient would also has a fatty cyst on bilateral forearms. The one on the left arm is causing constant pain in the left wrist. The pain is dull but seems to be getting worse. Patient is wondering if there is anything she can do.     Past Medical History:  Diagnosis Date  . Family history of breast cancer   . Family history of prostate cancer   . Neoplasm of appendix    No past surgical history on file. Social History   Social History  . Marital status: Married    Spouse name: N/A  . Number of children: N/A  . Years of education: N/A   Social History Main Topics  . Smoking status: Never Smoker  . Smokeless tobacco: Never Used  . Alcohol use None  . Drug use: Unknown  . Sexual activity: Not Asked   Other Topics Concern  . None   Social History Narrative  . None   Not on File Family History  Problem Relation Age of Onset  . Breast cancer Mother 47       had hysterectomy at 57  . Skin cancer Brother        had a few removed over lifetime, one was on chest.  Is now 93  . Lung cancer Paternal Grandmother 5       dx. early 10's died 21  . Lung cancer Paternal Grandfather 42       dx in 12's, died at 24  . Prostate cancer Maternal Uncle 70       died shortly after in 38's.  Metastatic- spread to brain and many other locations     Past medical history, social, surgical and family history all reviewed in electronic  medical record.  No pertanent information unless stated regarding to the chief complaint.   Review of Systems: No headache, visual changes, nausea, vomiting, diarrhea, constipation, dizziness, abdominal pain, skin rash, fevers, chills, night sweats, weight loss, swollen lymph nodes, body aches, joint swelling, muscle aches, chest pain, shortness of breath, mood changes.    Objective  Blood pressure 118/82, pulse 82, height 5' 6.5" (1.689 m), weight 174 lb (78.9 kg), SpO2 96 %.   Systems examined below as of 06/01/17 General: NAD A&O x3 mood, affect normal  HEENT: Pupils equal, extraocular movements intact no nystagmus Respiratory: not short of breath at rest or with speaking Cardiovascular: No lower extremity edema, non tender Skin: Warm dry intact with no signs of infection or rash on extremities or on axial skeleton. Abdomen: Soft nontender, no masses Neuro: Cranial nerves  intact, neurovascularly intact in all extremities with 2+ DTRs and 2+ pulses. Lymph: No lymphadenopathy appreciated today  Gait normal with good balance and coordination.  MSK: Non tender with full range of motion and good stability and symmetric strength and tone of shoulders, elbows, wrist,  knee hips and ankles bilaterally.  Patient is a very small  lipomas noted in the forearms bilaterally. Minimal tenderness to palpation Neck: Inspection mild loss of lordosis. No palpable stepoffs. Negative Spurling's maneuver. Lacks last 5 of left-sided rotation Grip strength and sensation normal in bilateral hands Strength good C4 to T1 distribution No sensory change to C4 to T1 Negative Hoffman sign bilaterally Reflexes normal  Back Exam:  Inspection: Mild loss of lordosis Motion: Flexion 45 deg, Extension 25 deg, Side Bending to 35 deg bilaterally,  Rotation to 35 deg bilaterally no change in range of motion SLR laying: Negative  XSLR laying: Negative  Palpable tenderness: More pain in the thoracolumbar juncture on  the left side than previous exam FABER: Tightness bilaterally. Sensory change: Gross sensation intact to all lumbar and sacral dermatomes.  Reflexes: 2+ at both patellar tendons, 2+ at achilles tendons, Babinski's downgoing.  Strength at foot  Plantar-flexion: 5/5 Dorsi-flexion: 5/5 Eversion: 5/5 Inversion: 5/5  Leg strength  Quad: 5/5 Hamstring: 5/5 Hip flexor: 5/5 Hip abductors: 5/5  Gait unremarkable.  Osteopathic findings C4 flexed rotated and side bent left C6 flexed rotated and side bent left T3 extended rotated and side bent to left inhaled third rib T9 extended rotated and side bent left L4 flexed rotated and side bent right Sacrum right on right      Impression and Recommendations:     This case required medical decision making of moderate complexity.      Note: This dictation was prepared with Dragon dictation along with smaller phrase technology. Any transcriptional errors that result from this process are unintentional.

## 2017-06-01 ENCOUNTER — Ambulatory Visit (INDEPENDENT_AMBULATORY_CARE_PROVIDER_SITE_OTHER): Payer: BLUE CROSS/BLUE SHIELD | Admitting: Family Medicine

## 2017-06-01 ENCOUNTER — Encounter: Payer: Self-pay | Admitting: Family Medicine

## 2017-06-01 VITALS — BP 118/82 | HR 82 | Ht 66.5 in | Wt 174.0 lb

## 2017-06-01 DIAGNOSIS — M999 Biomechanical lesion, unspecified: Secondary | ICD-10-CM

## 2017-06-01 DIAGNOSIS — G8929 Other chronic pain: Secondary | ICD-10-CM | POA: Diagnosis not present

## 2017-06-01 DIAGNOSIS — M545 Low back pain: Secondary | ICD-10-CM

## 2017-06-01 NOTE — Patient Instructions (Signed)
Good to see you  [employer] could be good.  Ice is your friend.  Keep working on Engineer, building services at work.  See me again in 4-6 weeks.

## 2017-06-01 NOTE — Assessment & Plan Note (Signed)
Patient does have significantly poor core strength. Still declines any physical therapy. Discussed with patient about icing regimen and home exercises. We discussed which activities to do a which ones to avoid. Patient will come back and see me again in 4 weeks

## 2017-06-01 NOTE — Assessment & Plan Note (Signed)
Decision today to treat with OMT was based on Physical Exam  After verbal consent patient was treated with HVLA, ME, FPR techniques in cervical, thoracic, lumbar and sacral areas  Patient tolerated the procedure well with improvement in symptoms  Patient given exercises, stretches and lifestyle modifications  See medications in patient instructions if given  Patient will follow up in 4 weeks 

## 2017-06-02 ENCOUNTER — Ambulatory Visit (AMBULATORY_SURGERY_CENTER): Payer: Self-pay | Admitting: *Deleted

## 2017-06-02 VITALS — Ht 66.0 in | Wt 172.0 lb

## 2017-06-02 DIAGNOSIS — Z1211 Encounter for screening for malignant neoplasm of colon: Secondary | ICD-10-CM

## 2017-06-02 MED ORDER — NA SULFATE-K SULFATE-MG SULF 17.5-3.13-1.6 GM/177ML PO SOLN: ORAL | 0 refills | Status: DC

## 2017-06-02 NOTE — Progress Notes (Signed)
Patient denies any allergies to eggs or soy. Patient denies any problems with anesthesia/sedation. Patient denies any oxygen use at home and does not take any diet/weight loss medications, & pt denies any blood thinners. EMMI education assisgned to patient on colonoscopy, this was explained and instructions given to patient.

## 2017-06-03 ENCOUNTER — Encounter: Payer: Self-pay | Admitting: Gastroenterology

## 2017-06-16 ENCOUNTER — Encounter: Payer: Self-pay | Admitting: Gastroenterology

## 2017-06-16 ENCOUNTER — Ambulatory Visit (AMBULATORY_SURGERY_CENTER): Payer: BLUE CROSS/BLUE SHIELD | Admitting: Gastroenterology

## 2017-06-16 VITALS — BP 107/66 | HR 71 | Temp 98.9°F | Resp 15 | Ht 66.0 in | Wt 172.0 lb

## 2017-06-16 DIAGNOSIS — Z1212 Encounter for screening for malignant neoplasm of rectum: Secondary | ICD-10-CM | POA: Diagnosis not present

## 2017-06-16 DIAGNOSIS — Z1211 Encounter for screening for malignant neoplasm of colon: Secondary | ICD-10-CM

## 2017-06-16 MED ORDER — SODIUM CHLORIDE 0.9 % IV SOLN: 500.0000 mL | INTRAVENOUS | Status: DC

## 2017-06-16 NOTE — Patient Instructions (Signed)
YOU HAD AN ENDOSCOPIC PROCEDURE TODAY AT [facility]:   Refer to the procedure report that was given to you for any specific questions about what was found during the examination.  If the procedure report does not answer your questions, please call your gastroenterologist to clarify.  If you requested that your care partner not be given the details of your procedure findings, then the procedure report has been included in a sealed envelope for you to review at your convenience later.  YOU SHOULD EXPECT: Some feelings of bloating in the abdomen. Passage of more gas than usual.  Walking can help get rid of the air that was put into your GI tract during the procedure and reduce the bloating. If you had a lower endoscopy (such as a colonoscopy or flexible sigmoidoscopy) you may notice spotting of blood in your stool or on the toilet paper. If you underwent a bowel prep for your procedure, you may not have a normal bowel movement for a few days.  Please Note:  You might notice some irritation and congestion in your nose or some drainage.  This is from the oxygen used during your procedure.  There is no need for concern and it should clear up in a day or so.  SYMPTOMS TO REPORT IMMEDIATELY:   Following lower endoscopy (colonoscopy or flexible sigmoidoscopy):  Excessive amounts of blood in the stool  Significant tenderness or worsening of abdominal pains  Swelling of the abdomen that is new, acute  Fever of 100F or higher   For urgent or emergent issues, a gastroenterologist can be reached at any hour by calling [phone].   DIET:  We do recommend a small meal at first, but then you may proceed to your regular diet.  Drink plenty of fluids but you should avoid alcoholic beverages for 24 hours.  ACTIVITY:  You should plan to take it easy for the rest of today and you should NOT DRIVE or use heavy machinery until tomorrow (because of the sedation medicines used during the test).     FOLLOW UP: Our staff will call the number listed on your records the next business day following your procedure to check on you and address any questions or concerns that you may have regarding the information given to you following your procedure. If we do not reach you, we will leave a message.  However, if you are feeling well and you are not experiencing any problems, there is no need to return our call.  We will assume that you have returned to your regular daily activities without incident.  If any biopsies were taken you will be contacted by phone or by letter within the next 1-3 weeks.  Please call us at [phone] if you have not heard about the biopsies in 3 weeks.    SIGNATURES/CONFIDENTIALITY: You and/or your care partner have signed paperwork which will be entered into your electronic medical record.  These signatures attest to the fact that that the information above on your After Visit Summary has been reviewed and is understood.  Full responsibility of the confidentiality of this discharge information lies with you and/or your care-partner.  Read al of the handouts given to you by your recovery room nure.

## 2017-06-16 NOTE — Progress Notes (Signed)
Report given to PACU, vss 

## 2017-06-16 NOTE — Op Note (Addendum)
[name] Procedure Date: 06/16/2017 10:35 AM MRN: [id number] Endoscopist: Ladene Artist , MD Age: 51 Referring MD:  Date of Birth: 1966/04/30 Gender: Female Account #: [id number] Procedure:                Colonoscopy Indications:              Screening for colorectal malignant neoplasm.                            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 Medicines:                Monitored Anesthesia Care Procedure:                Pre-Anesthesia Assessment:                           - Prior to the procedure, a History and Physical                            was performed, and patient medications and                            allergies were reviewed. The patient's tolerance of                            previous anesthesia was also reviewed. The risks                            and benefits of the procedure and the sedation                            options and risks were discussed with the patient.                            All questions were answered, and informed consent                            was obtained. Prior Anticoagulants: The patient has                            taken no previous anticoagulant or antiplatelet                            agents. ASA Grade Assessment: II - A patient with                            mild systemic disease. After reviewing the risks                            and benefits, the patient was deemed in  satisfactory condition to undergo the procedure.                           After obtaining informed consent, the colonoscope                            was passed under direct vision. Throughout the                            procedure, the patient's blood pressure, pulse, and                            oxygen saturations were monitored continuously. The                            Model PCF-[id number]) scope was  introduced                            through the anus and advanced to the the cecum,                            identified by appendiceal orifice and ileocecal                            valve. The ileocecal valve, appendiceal orifice,                            and rectum were photographed. The quality of the                            bowel preparation was excellent. The colonoscopy                            was performed without difficulty. The patient                            tolerated the procedure well. Scope In: 10:40:24 AM Scope Out: 10:53:35 AM Scope Withdrawal Time: 0 hours 9 minutes 43 seconds  Total Procedure Duration: 0 hours 13 minutes 11 seconds  Findings:                 The perianal and digital rectal examinations were                            normal.                           Internal hemorrhoids were found during                            retroflexion. The hemorrhoids were small and Grade                            I (internal hemorrhoids that do not prolapse).  The exam was otherwise without abnormality on                            direct and retroflexion views. Complications:            No immediate complications. Estimated blood loss:                            None. Estimated Blood Loss:     Estimated blood loss: none. Impression:               - Internal hemorrhoids.                           - The examination was otherwise normal on direct                            and retroflexion views.                           - No specimens collected. Recommendation:           - Repeat colonoscopy in 10 years for screening                            purposes.                           - Patient has a contact number available for                            emergencies. The signs and symptoms of potential                            delayed complications were discussed with the                            patient. Return to normal activities  tomorrow.                            Written discharge instructions were provided to the                            patient.                           - Resume previous diet.                           - Continue present medications. Ladene Artist, MD 06/16/2017 10:57:08 AM This report has been signed electronically.

## 2017-06-16 NOTE — Progress Notes (Signed)
Pt's states no medical or surgical changes since previsit or office visit. maw 

## 2017-06-17 ENCOUNTER — Telehealth: Payer: Self-pay | Admitting: *Deleted

## 2017-06-17 NOTE — Telephone Encounter (Signed)
  Follow up Call-  Call back number 06/16/2017  Post procedure Call Back phone  # [phone] cell  Permission to leave phone message Yes  Some recent data might be hidden     Patient questions:  Do you have a fever, pain , or abdominal swelling? No. Pain Score  0 *  Have you tolerated food without any problems? Yes.    Have you been able to return to your normal activities? Yes.    Do you have any questions about your discharge instructions: Diet   No. Medications  No. Follow up visit  No.  Do you have questions or concerns about your Care? No.  Actions: * If pain score is 4 or above: No action needed, pain <4.

## 2017-07-10 ENCOUNTER — Other Ambulatory Visit: Payer: Self-pay | Admitting: Family Medicine

## 2017-07-11 NOTE — Telephone Encounter (Signed)
Refill done.  

## 2017-07-27 DIAGNOSIS — E559 Vitamin D deficiency, unspecified: Secondary | ICD-10-CM | POA: Diagnosis not present

## 2017-07-27 DIAGNOSIS — R5383 Other fatigue: Secondary | ICD-10-CM | POA: Diagnosis not present

## 2017-07-27 DIAGNOSIS — E039 Hypothyroidism, unspecified: Secondary | ICD-10-CM | POA: Diagnosis not present

## 2017-07-27 DIAGNOSIS — N951 Menopausal and female climacteric states: Secondary | ICD-10-CM | POA: Diagnosis not present

## 2017-08-15 ENCOUNTER — Ambulatory Visit: Payer: BLUE CROSS/BLUE SHIELD | Admitting: Family Medicine

## 2017-08-19 ENCOUNTER — Encounter: Payer: Self-pay | Admitting: Family Medicine

## 2017-08-19 ENCOUNTER — Ambulatory Visit: Payer: BLUE CROSS/BLUE SHIELD | Admitting: Family Medicine

## 2017-08-19 VITALS — BP 122/82 | HR 80 | Ht 66.5 in | Wt 176.0 lb

## 2017-08-19 DIAGNOSIS — M999 Biomechanical lesion, unspecified: Secondary | ICD-10-CM

## 2017-08-19 DIAGNOSIS — M62838 Other muscle spasm: Secondary | ICD-10-CM

## 2017-08-19 NOTE — Progress Notes (Signed)
[name] [address] [place], [place] [address] Phone: [phone] Subjective:     CC: Back pain follow-up  [id number]  [name] is a 51 y.o. female coming in for right shoulder pain. She said that over Thanksgiving she picked up a heavy item that caused her shoulder to lock up. She did get a massage which seemed to help. She also feels like her lower back is out of alignment.       Past Medical History:  Diagnosis Date  . 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 Family history of breast cancer   . Family history of prostate cancer   . Interstitial cystitis    mild  . Neoplasm of appendix   . Thyroid disease    Past Surgical History:  Procedure Laterality Date  . APPENDECTOMY  08/2006  . COLONOSCOPY  04/17/2007  . TUBAL LIGATION  11/1998  . UMBILICAL HERNIA REPAIR  10/2016   Social History   Socioeconomic History  . Marital status: Married    Spouse name: Not on file  . Number of children: Not on file  . Years of education: Not on file  . Highest education level: Not on file  Social Needs  . Financial resource strain: Not on file  . Food insecurity - worry: Not on file  . Food insecurity - inability: Not on file  . Transportation needs - medical: Not on file  . Transportation needs - non-medical: Not on file  Occupational History  . Not on file  Tobacco Use  . Smoking status: Never Smoker  . Smokeless tobacco: Never Used  Substance and Sexual Activity  . Alcohol use: Yes    Alcohol/week: 3.0 oz    Types: 5 Glasses of wine per week    Comment: occasional  . Drug use: No  . Sexual activity: Not on file  Other Topics Concern  . Not on file  Social History Narrative  . Not on file   Allergies  Allergen Reactions  . Sulfur Hives  . Sulfa Antibiotics Hives and Rash    Reaction not listed   Family History  Problem Relation Age of Onset  . Breast cancer Mother 101       had hysterectomy at  3  . Skin cancer Brother        had a few removed over lifetime, one was on chest.  Is now 41  . Stomach cancer Maternal Grandmother   . Lung cancer Paternal Grandmother 37       dx. early 50's died 58  . Lung cancer Paternal Grandfather 33       dx in 101's, died at 68  . Prostate cancer Maternal Uncle 70       died shortly after in 10's.  Metastatic- spread to brain and many other locations  . Colon cancer Neg Hx   . Esophageal cancer Neg Hx   . Pancreatic cancer Neg Hx   . Rectal cancer Neg Hx      Past medical history, social, surgical and family history all reviewed in electronic medical record.  No pertanent information unless stated regarding to the chief complaint.   Review of Systems:Review of systems updated and as accurate as of 08/19/17  No headache, visual changes, nausea, vomiting, diarrhea, constipation, dizziness, abdominal pain, skin rash, fevers, chills, night sweats, weight loss, swollen lymph nodes, body aches, joint swelling,  chest pain, shortness of breath, mood changes.  Positive muscle aches  Objective  There were no vitals taken for this visit. Systems examined below as of 08/19/17   General: No apparent distress alert and oriented x3 mood and affect normal, dressed appropriately.  HEENT: Pupils equal, extraocular movements intact  Respiratory: Patient's speak in full sentences and does not appear short of breath  Cardiovascular: No lower extremity edema, non tender, no erythema  Skin: Warm dry intact with no signs of infection or rash on extremities or on axial skeleton.  Abdomen: Soft nontender  Neuro: Cranial nerves II through XII are intact, neurovascularly intact in all extremities with 2+ DTRs and 2+ pulses.  Lymph: No lymphadenopathy of posterior or anterior cervical chain or axillae bilaterally.  Gait normal with good balance and coordination.  MSK:  Non tender with full range of motion and good stability and symmetric strength and tone of  shoulders, elbows, wrist, hip, knee and ankles bilaterally.  Neck: Inspection unremarkable. No palpable stepoffs. Negative Spurling's maneuver. Mild limitation like in the last 5 degrees of extension Grip strength and sensation normal in bilateral hands Strength good C4 to T1 distribution No sensory change to C4 to T1 Negative Hoffman sign bilaterally Reflexes normal Sided trapezius tightness on the right  Osteopathic findings  C2 flexed rotated and side bent right T3 extended rotated and side bent right inhaled third rib L4 flexed rotated and side bent left  Sacrum right on right    Impression and Recommendations:     This case required medical decision making of moderate complexity.      Note: This dictation was prepared with Dragon dictation along with smaller phrase technology. Any transcriptional errors that result from this process are unintentional.

## 2017-08-19 NOTE — Assessment & Plan Note (Signed)
Decision today to treat with OMT was based on Physical Exam  After verbal consent patient was treated with HVLA, ME, FPR techniques in cervical, thoracic, lumbar and sacral areas  Patient tolerated the procedure well with improvement in symptoms  Patient given exercises, stretches and lifestyle modifications  See medications in patient instructions if given  Patient will follow up in 8 weeks 

## 2017-08-19 NOTE — Assessment & Plan Note (Signed)
Mild spasm again.  We discussed icing regimen and home exercises.  We discussed osteopathic manipulation and how this improves.  Patient will continue to alternate with massage.  Patient has a different medications for any type of breakthrough pain.  Follow-up again in 2 months

## 2017-08-23 ENCOUNTER — Ambulatory Visit
Admission: RE | Admit: 2017-08-23 | Discharge: 2017-08-23 | Disposition: A | Discharge status: Home / Self Care | Payer: BLUE CROSS/BLUE SHIELD | Source: Ambulatory Visit | Attending: Obstetrics and Gynecology | Admitting: Obstetrics and Gynecology

## 2017-08-23 DIAGNOSIS — Z9189 Other specified personal risk factors, not elsewhere classified: Secondary | ICD-10-CM

## 2017-08-23 DIAGNOSIS — Z803 Family history of malignant neoplasm of breast: Secondary | ICD-10-CM | POA: Diagnosis not present

## 2017-08-23 MED ORDER — GADOBENATE DIMEGLUMINE 529 MG/ML IV SOLN
15.0000 mL | Freq: Once | INTRAVENOUS | Status: AC | PRN
  Administered 2017-08-23: 15 mL via INTRAVENOUS

## 2017-09-26 DIAGNOSIS — E559 Vitamin D deficiency, unspecified: Secondary | ICD-10-CM | POA: Diagnosis not present

## 2017-09-26 DIAGNOSIS — N951 Menopausal and female climacteric states: Secondary | ICD-10-CM | POA: Diagnosis not present

## 2017-09-26 DIAGNOSIS — R5383 Other fatigue: Secondary | ICD-10-CM | POA: Diagnosis not present

## 2017-09-26 DIAGNOSIS — E039 Hypothyroidism, unspecified: Secondary | ICD-10-CM | POA: Diagnosis not present

## 2017-10-12 DIAGNOSIS — E663 Overweight: Secondary | ICD-10-CM | POA: Diagnosis not present

## 2017-10-12 DIAGNOSIS — R6882 Decreased libido: Secondary | ICD-10-CM | POA: Diagnosis not present

## 2017-10-12 DIAGNOSIS — N951 Menopausal and female climacteric states: Secondary | ICD-10-CM | POA: Diagnosis not present

## 2017-10-12 DIAGNOSIS — R5383 Other fatigue: Secondary | ICD-10-CM | POA: Diagnosis not present

## 2017-11-02 DIAGNOSIS — N950 Postmenopausal bleeding: Secondary | ICD-10-CM | POA: Diagnosis not present

## 2017-11-02 DIAGNOSIS — N390 Urinary tract infection, site not specified: Secondary | ICD-10-CM | POA: Diagnosis not present

## 2017-11-02 DIAGNOSIS — N760 Acute vaginitis: Secondary | ICD-10-CM | POA: Diagnosis not present

## 2017-11-02 DIAGNOSIS — R82998 Other abnormal findings in urine: Secondary | ICD-10-CM | POA: Diagnosis not present

## 2017-11-03 DIAGNOSIS — N950 Postmenopausal bleeding: Secondary | ICD-10-CM | POA: Diagnosis not present

## 2017-11-10 DIAGNOSIS — N950 Postmenopausal bleeding: Secondary | ICD-10-CM | POA: Diagnosis not present

## 2017-11-21 DIAGNOSIS — N951 Menopausal and female climacteric states: Secondary | ICD-10-CM | POA: Diagnosis not present

## 2017-11-21 DIAGNOSIS — R5383 Other fatigue: Secondary | ICD-10-CM | POA: Diagnosis not present

## 2017-11-21 DIAGNOSIS — N950 Postmenopausal bleeding: Secondary | ICD-10-CM | POA: Diagnosis not present

## 2017-11-21 DIAGNOSIS — E039 Hypothyroidism, unspecified: Secondary | ICD-10-CM | POA: Diagnosis not present

## 2017-11-21 DIAGNOSIS — F329 Major depressive disorder, single episode, unspecified: Secondary | ICD-10-CM | POA: Diagnosis not present

## 2017-12-26 NOTE — Progress Notes (Signed)
[name] [address], [place] [address] Phone: [phone] Subjective:    I'm seeing this patient by the request  of:    CC: Hip pain follow-up  [id number]  [name] is a 52 y.o. female coming in with complaint of back pain and hip pain.  Seem to be more secondary to muscular.  Patient has responded to manipulation in the past.  Patient states that her L hip is bothering her today and is having some midline neck pain.  She states that she has been travelling for the last 2 weeks and has done a lot of walking during that time.  Regarding her general health, the pt states that she recently got the Shingrix vaccine and got sick following this vaccination, including contracting shingles in her R arm which has been treated.     Past Medical History:  Diagnosis Date  . 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 Family history of breast cancer   . Family history of prostate cancer   . Interstitial cystitis    mild  . Neoplasm of appendix   . Thyroid disease    Past Surgical History:  Procedure Laterality Date  . APPENDECTOMY  08/2006  . COLONOSCOPY  04/17/2007  . TUBAL LIGATION  11/1998  . UMBILICAL HERNIA REPAIR  10/2016   Social History   Socioeconomic History  . Marital status: Married    Spouse name: Not on file  . Number of children: Not on file  . Years of education: Not on file  . Highest education level: Not on file  Occupational History  . Not on file  Social Needs  . Financial resource strain: Not on file  . Food insecurity:    Worry: Not on file    Inability: Not on file  . Transportation needs:    Medical: Not on file    Non-medical: Not on file  Tobacco Use  . Smoking status: Never Smoker  . Smokeless tobacco: Never Used  Substance and Sexual Activity  . Alcohol use: Yes    Alcohol/week: 3.0 oz    Types: 5 Glasses of wine per week    Comment: occasional  . Drug use: No  . Sexual  activity: Not on file  Lifestyle  . Physical activity:    Days per week: Not on file    Minutes per session: Not on file  . Stress: Not on file  Relationships  . Social connections:    Talks on phone: Not on file    Gets together: Not on file    Attends religious service: Not on file    Active member of club or organization: Not on file    Attends meetings of clubs or organizations: Not on file    Relationship status: Not on file  Other Topics Concern  . Not on file  Social History Narrative  . Not on file   Allergies  Allergen Reactions  . Sulfur Hives  . Sulfa Antibiotics Hives and Rash    Reaction not listed   Family History  Problem Relation Age of Onset  . Breast cancer Mother 69       had hysterectomy at 61  . Skin cancer Brother        had a few removed over lifetime, one was on chest.  Is now 49  . Stomach cancer Maternal Grandmother   . Lung cancer Paternal Grandmother 22  dx. early 10's died 88  . Lung cancer Paternal Grandfather 41       dx in 56's, died at 38  . Prostate cancer Maternal Uncle 70       died shortly after in 22's.  Metastatic- spread to brain and many other locations  . Colon cancer Neg Hx   . Esophageal cancer Neg Hx   . Pancreatic cancer Neg Hx   . Rectal cancer Neg Hx      Past medical history, social, surgical and family history all reviewed in electronic medical record.  No pertanent information unless stated regarding to the chief complaint.   Review of Systems:Review of systems updated and as accurate as of 12/27/17  No headache, visual changes, nausea, vomiting, diarrhea, constipation, dizziness, abdominal pain, skin rash, fevers, chills, night sweats, weight loss, swollen lymph nodes, body aches, joint swelling,chest pain, shortness of breath, mood changes.  Positive muscle aches  Objective  Blood pressure 132/76, pulse 98, height 5' 6.5" (1.689 m), weight 182 lb (82.6 kg), SpO2 97 %. Systems examined below as of 12/27/17     General: No apparent distress alert and oriented x3 mood and affect normal, dressed appropriately.  HEENT: Pupils equal, extraocular movements intact  Respiratory: Patient's speak in full sentences and does not appear short of breath  Cardiovascular: No lower extremity edema, non tender, no erythema  Skin: Warm dry intact with no signs of infection or rash on extremities or on axial skeleton.  Abdomen: Soft nontender  Neuro: Cranial nerves II through XII are intact, neurovascularly intact in all extremities with 2+ DTRs and 2+ pulses.  Lymph: No lymphadenopathy of posterior or anterior cervical chain or axillae bilaterally.  Gait normal with good balance and coordination.  MSK:  Non tender with full range of motion and good stability and symmetric strength and tone of shoulders, elbows, wrist, hip, knee and ankles bilaterally.  Neck: Inspection mild loss of lordosis. No palpable stepoffs. Negative Spurling's maneuver. Loss of range of motion with side bending and rotation bilaterally Grip strength and sensation normal in bilateral hands Strength good C4 to T1 distribution No sensory change to C4 to T1 Negative Hoffman sign bilaterally Reflexes normal Tightness of the left  Back Exam:  Inspection: Unremarkable  Motion: Flexion 45 deg, Extension 25 deg, Side Bending to 35 deg bilaterally,  Rotation to 45 deg bilaterally  SLR laying: Negative  XSLR laying: Negative  Palpable tenderness: Tender to palpation the paraspinal musculature.Marland Kitchen FABER: Positive on the left. Sensory change: Gross sensation intact to all lumbar and sacral dermatomes.  Reflexes: 2+ at both patellar tendons, 2+ at achilles tendons, Babinski's downgoing.  Strength at foot  Plantar-flexion: 5/5 Dorsi-flexion: 5/5 Eversion: 5/5 Inversion: 5/5  Leg strength  Quad: 5/5 Hamstring: 5/5 Hip flexor: 5/5 Hip abductors: 5/5  Gait unremarkable.  Osteopathic findings C2 flexed rotated and side bent right C4 flexed rotated  and side bent left C7 flexed rotated and side bent left T3 extended rotated and side bent right inhaled third rib T6 extended rotated and side bent left L2 flexed rotated and side bent right Sacrum right on right    Impression and Recommendations:     This case required medical decision making of moderate complexity.      Note: This dictation was prepared with Dragon dictation along with smaller phrase technology. Any transcriptional errors that result from this process are unintentional.

## 2017-12-27 ENCOUNTER — Ambulatory Visit: Payer: BLUE CROSS/BLUE SHIELD | Admitting: Family Medicine

## 2017-12-27 ENCOUNTER — Encounter: Payer: Self-pay | Admitting: Family Medicine

## 2017-12-27 VITALS — BP 132/76 | HR 98 | Ht 66.5 in | Wt 182.0 lb

## 2017-12-27 DIAGNOSIS — M999 Biomechanical lesion, unspecified: Secondary | ICD-10-CM

## 2017-12-27 DIAGNOSIS — G8929 Other chronic pain: Secondary | ICD-10-CM

## 2017-12-27 DIAGNOSIS — M545 Low back pain, unspecified: Secondary | ICD-10-CM

## 2017-12-27 NOTE — Assessment & Plan Note (Signed)
Continues to have poor strength.  Discussed with patient at great length about icing regimen and home exercises.  Discussed which activities to doing which wants to avoid.  Patient is to increase activity as tolerated.  Patient will see me again in 3-4 months.

## 2017-12-27 NOTE — Patient Instructions (Signed)
Good to see you  Ice is your friend I am impress with everything See me again in 3 months maybe  Ca;ll [phone]

## 2017-12-27 NOTE — Assessment & Plan Note (Signed)
Decision today to treat with OMT was based on Physical Exam  After verbal consent patient was treated with HVLA, ME, FPR techniques in cervical, thoracic, lumbar and sacral areas  Patient tolerated the procedure well with improvement in symptoms  Patient given exercises, stretches and lifestyle modifications  See medications in patient instructions if given  Patient will follow up in 1-2 weeks 

## 2018-01-06 DIAGNOSIS — R5383 Other fatigue: Secondary | ICD-10-CM | POA: Diagnosis not present

## 2018-01-06 DIAGNOSIS — E559 Vitamin D deficiency, unspecified: Secondary | ICD-10-CM | POA: Diagnosis not present

## 2018-01-06 DIAGNOSIS — N951 Menopausal and female climacteric states: Secondary | ICD-10-CM | POA: Diagnosis not present

## 2018-01-06 DIAGNOSIS — E039 Hypothyroidism, unspecified: Secondary | ICD-10-CM | POA: Diagnosis not present

## 2018-01-16 DIAGNOSIS — N951 Menopausal and female climacteric states: Secondary | ICD-10-CM | POA: Diagnosis not present

## 2018-01-16 DIAGNOSIS — F329 Major depressive disorder, single episode, unspecified: Secondary | ICD-10-CM | POA: Diagnosis not present

## 2018-01-16 DIAGNOSIS — R5383 Other fatigue: Secondary | ICD-10-CM | POA: Diagnosis not present

## 2018-01-16 DIAGNOSIS — E039 Hypothyroidism, unspecified: Secondary | ICD-10-CM | POA: Diagnosis not present

## 2018-02-13 DIAGNOSIS — R9389 Abnormal findings on diagnostic imaging of other specified body structures: Secondary | ICD-10-CM | POA: Diagnosis not present

## 2018-02-13 DIAGNOSIS — Z01419 Encounter for gynecological examination (general) (routine) without abnormal findings: Secondary | ICD-10-CM | POA: Diagnosis not present

## 2018-02-13 DIAGNOSIS — N83201 Unspecified ovarian cyst, right side: Secondary | ICD-10-CM | POA: Diagnosis not present

## 2018-02-13 DIAGNOSIS — Z0389 Encounter for observation for other suspected diseases and conditions ruled out: Secondary | ICD-10-CM | POA: Diagnosis not present

## 2018-02-13 DIAGNOSIS — Z6828 Body mass index (BMI) 28.0-28.9, adult: Secondary | ICD-10-CM | POA: Diagnosis not present

## 2018-03-13 ENCOUNTER — Other Ambulatory Visit: Payer: Self-pay | Admitting: Obstetrics and Gynecology

## 2018-03-13 DIAGNOSIS — Z1231 Encounter for screening mammogram for malignant neoplasm of breast: Secondary | ICD-10-CM

## 2018-03-22 NOTE — Progress Notes (Signed)
[name] [place], [place] [address] Phone: [phone] Subjective:      CC: Neck pain, back pain follow-up and new foot pain  [id number]  [name] is a 52 y.o. female coming in with complaint of back pain. She states that her toes have numbness and tingling. She also has hip pain. Exercises regularly. Numbness and tingling has been happening more often and it happens whether she's sitting or standing. States she has increased her exercise recently.  Patient states that overall her neck and back seems to be doing relatively well.  Has responded well to manipulation.  Last seen 3 months ago.  No radicular symptoms except for the numbness in the left foot.  Patient does state that the numbness in the left foot seems to get worse when she is doing the elliptical more than anything else.      Past Medical History:  Diagnosis Date  . 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 Family history of breast cancer   . Family history of prostate cancer   . Interstitial cystitis    mild  . Neoplasm of appendix   . Thyroid disease    Past Surgical History:  Procedure Laterality Date  . APPENDECTOMY  08/2006  . COLONOSCOPY  04/17/2007  . TUBAL LIGATION  11/1998  . UMBILICAL HERNIA REPAIR  10/2016   Social History   Socioeconomic History  . Marital status: Married    Spouse name: Not on file  . Number of children: Not on file  . Years of education: Not on file  . Highest education level: Not on file  Occupational History  . Not on file  Social Needs  . Financial resource strain: Not on file  . Food insecurity:    Worry: Not on file    Inability: Not on file  . Transportation needs:    Medical: Not on file    Non-medical: Not on file  Tobacco Use  . Smoking status: Never Smoker  . Smokeless tobacco: Never Used  Substance and Sexual Activity  . Alcohol use: Yes    Alcohol/week: 3.0 oz    Types: 5 Glasses  of wine per week    Comment: occasional  . Drug use: No  . Sexual activity: Not on file  Lifestyle  . Physical activity:    Days per week: Not on file    Minutes per session: Not on file  . Stress: Not on file  Relationships  . Social connections:    Talks on phone: Not on file    Gets together: Not on file    Attends religious service: Not on file    Active member of club or organization: Not on file    Attends meetings of clubs or organizations: Not on file    Relationship status: Not on file  Other Topics Concern  . Not on file  Social History Narrative  . Not on file   Allergies  Allergen Reactions  . Sulfur Hives  . Sulfa Antibiotics Hives and Rash    Reaction not listed   Family History  Problem Relation Age of Onset  . Breast cancer Mother 35       had hysterectomy at 90  . Skin cancer Brother        had a few removed over lifetime, one was on chest.  Is now 12  . Stomach cancer Maternal Grandmother   . Lung cancer Paternal  Grandmother 44       dx. early 65's died 33  . Lung cancer Paternal Grandfather 78       dx in 51's, died at 42  . Prostate cancer Maternal Uncle 70       died shortly after in 35's.  Metastatic- spread to brain and many other locations  . Colon cancer Neg Hx   . Esophageal cancer Neg Hx   . Pancreatic cancer Neg Hx   . Rectal cancer Neg Hx      Past medical history, social, surgical and family history all reviewed in electronic medical record.  No pertanent information unless stated regarding to the chief complaint.   Review of Systems:Review of systems updated and as accurate as of 03/23/18  No headache, visual changes, nausea, vomiting, diarrhea, constipation, dizziness, abdominal pain, skin rash, fevers, chills, night sweats, weight loss, swollen lymph nodes, body aches, joint swelling,chest pain, shortness of breath, mood changes.  Positive muscle aches  Objective  Blood pressure 110/80, pulse 72, height 5' 6.5" (1.689 m), weight  173 lb (78.5 kg), SpO2 97 %. Systems examined below as of 03/23/18   General: No apparent distress alert and oriented x3 mood and affect normal, dressed appropriately.  HEENT: Pupils equal, extraocular movements intact  Respiratory: Patient's speak in full sentences and does not appear short of breath  Cardiovascular: No lower extremity edema, non tender, no erythema  Skin: Warm dry intact with no signs of infection or rash on extremities or on axial skeleton.  Abdomen: Soft nontender  Neuro: Cranial nerves II through XII are intact, neurovascularly intact in all extremities with 2+ DTRs and 2+ pulses.  Lymph: No lymphadenopathy of posterior or anterior cervical chain or axillae bilaterally.  Gait normal with good balance and coordination.  MSK:  Non tender with full range of motion and good stability and symmetric strength and tone of shoulders, elbows, wrist, hip, knee and ankles bilaterally.  Foot exam shows the patient does have narrow foot bilaterally.  Left foot does show overpronation of the hindfoot.  Patient does have a positive squeeze test.  No pain between the third and fourth metatarsals.  Nontender around the ankle with full range of motion.  Neck: Inspection mild loss of lordosis. No palpable stepoffs. Negative Spurling's maneuver. Mild decreased range of motion lacking last 10 degrees of sidebending bilaterally Grip strength and sensation normal in bilateral hands Strength good C4 to T1 distribution No sensory change to C4 to T1 Negative Hoffman sign bilaterally Reflexes normal Mild tightness of the trapezius on the left  Osteopathic findings C4 flexed rotated and side bent left C7 flexed rotated and side bent left T3 extended rotated and side bent right inhaled third rib T6 extended rotated and side bent left L2 flexed rotated and side bent right Sacrum right on right      Impression and Recommendations:     This case required medical decision making of  moderate complexity.      Note: This dictation was prepared with Dragon dictation along with smaller phrase technology. Any transcriptional errors that result from this process are unintentional.

## 2018-03-23 ENCOUNTER — Ambulatory Visit: Payer: BLUE CROSS/BLUE SHIELD | Admitting: Family Medicine

## 2018-03-23 ENCOUNTER — Encounter: Payer: Self-pay | Admitting: Family Medicine

## 2018-03-23 VITALS — BP 110/80 | HR 72 | Ht 66.5 in | Wt 173.0 lb

## 2018-03-23 DIAGNOSIS — G5762 Lesion of plantar nerve, left lower limb: Secondary | ICD-10-CM | POA: Insufficient documentation

## 2018-03-23 DIAGNOSIS — M999 Biomechanical lesion, unspecified: Secondary | ICD-10-CM

## 2018-03-23 DIAGNOSIS — M62838 Other muscle spasm: Secondary | ICD-10-CM

## 2018-03-23 MED ORDER — GABAPENTIN 100 MG PO CAPS: 200.0000 mg | ORAL_CAPSULE | Freq: Every day | ORAL | 3 refills | Status: DC

## 2018-03-23 NOTE — Patient Instructions (Signed)
Good to see you  [name] is your friend.  Gabapentin 200mg  at night Exercises 3 times a week.  Shoe do not lace last eye  Keep being active For the foot see me again in 6 weeks otherwise for your back see em again in 2-3 months

## 2018-03-23 NOTE — Assessment & Plan Note (Signed)
Patient doing much better overall.  Discussed icing regimen and home exercise.  Discussed core stability.  Discussed which activities of doing which wants to avoid.  And ergonomics at work we discussed.  Proper lifting mechanics.  Follow-up again in 3 months

## 2018-03-23 NOTE — Assessment & Plan Note (Signed)
Discussed over-the-counter orthotics, proper shoes, discussed potential for injection, started gabapentin.  Follow-up again in 4 weeks

## 2018-03-23 NOTE — Assessment & Plan Note (Signed)
Decision today to treat with OMT was based on Physical Exam  After verbal consent patient was treated with HVLA, ME, FPR techniques in cervical, thoracic, rib, lumbar and sacral areas  Patient tolerated the procedure well with improvement in symptoms  Patient given exercises, stretches and lifestyle modifications  See medications in patient instructions if given  Patient will follow up in 12 weeks 

## 2018-04-13 DIAGNOSIS — E039 Hypothyroidism, unspecified: Secondary | ICD-10-CM | POA: Diagnosis not present

## 2018-04-13 DIAGNOSIS — R5383 Other fatigue: Secondary | ICD-10-CM | POA: Diagnosis not present

## 2018-04-13 DIAGNOSIS — F329 Major depressive disorder, single episode, unspecified: Secondary | ICD-10-CM | POA: Diagnosis not present

## 2018-04-13 DIAGNOSIS — N951 Menopausal and female climacteric states: Secondary | ICD-10-CM | POA: Diagnosis not present

## 2018-04-14 ENCOUNTER — Other Ambulatory Visit: Payer: Self-pay | Admitting: Family Medicine

## 2018-04-24 DIAGNOSIS — N951 Menopausal and female climacteric states: Secondary | ICD-10-CM | POA: Diagnosis not present

## 2018-04-24 DIAGNOSIS — R5383 Other fatigue: Secondary | ICD-10-CM | POA: Diagnosis not present

## 2018-04-24 DIAGNOSIS — F329 Major depressive disorder, single episode, unspecified: Secondary | ICD-10-CM | POA: Diagnosis not present

## 2018-04-24 DIAGNOSIS — R6882 Decreased libido: Secondary | ICD-10-CM | POA: Diagnosis not present

## 2018-04-27 ENCOUNTER — Ambulatory Visit
Admission: RE | Admit: 2018-04-27 | Discharge: 2018-04-27 | Disposition: A | Discharge status: Home / Self Care | Payer: BLUE CROSS/BLUE SHIELD | Source: Ambulatory Visit | Attending: Obstetrics and Gynecology | Admitting: Obstetrics and Gynecology

## 2018-04-27 DIAGNOSIS — Z1231 Encounter for screening mammogram for malignant neoplasm of breast: Secondary | ICD-10-CM

## 2018-05-10 ENCOUNTER — Other Ambulatory Visit: Payer: Self-pay | Admitting: Obstetrics and Gynecology

## 2018-05-10 DIAGNOSIS — Z803 Family history of malignant neoplasm of breast: Secondary | ICD-10-CM

## 2018-05-19 NOTE — Progress Notes (Signed)
[name] [place], [place] [address] Phone: [phone] Subjective:   [name], am serving as a scribe for Dr. Hulan Saas.   CC: Left foot pain  UDJ:[id number]  [name] is a 52 y.o. female coming in with complaint of left foot pain. She feels like her pain is getting worse in the left foot. Patient uses elliptical but she gets numbness and pain. Pain located between 4th and 5th. Wearing heels exacerbates her pain.  Patient states that it is severe enough that stops her from activity. Also low back pain.  Has been seen multiple times for this.  Patient has responded well to manipulation.  Feels like she is having some increasing tightness recently.  Patient denies any radiation down the leg.  Denies any numbness on a constant basis other than in her foot.      Past Medical History:  Diagnosis Date  . 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 Family history of breast cancer   . Family history of prostate cancer   . Interstitial cystitis    mild  . Neoplasm of appendix   . Thyroid disease    Past Surgical History:  Procedure Laterality Date  . APPENDECTOMY  08/2006  . COLONOSCOPY  04/17/2007  . TUBAL LIGATION  11/1998  . UMBILICAL HERNIA REPAIR  10/2016   Social History   Socioeconomic History  . Marital status: Married    Spouse name: Not on file  . Number of children: Not on file  . Years of education: Not on file  . Highest education level: Not on file  Occupational History  . Not on file  Social Needs  . Financial resource strain: Not on file  . Food insecurity:    Worry: Not on file    Inability: Not on file  . Transportation needs:    Medical: Not on file    Non-medical: Not on file  Tobacco Use  . Smoking status: Never Smoker  . Smokeless tobacco: Never Used  Substance and Sexual Activity  . Alcohol use: Yes    Alcohol/week: 5.0 standard drinks    Types: 5 Glasses of wine per  week    Comment: occasional  . Drug use: [name]  . Sexual activity: Not on file  Lifestyle  . Physical activity:    Days per week: Not on file    Minutes per session: Not on file  . Stress: Not on file  Relationships  . Social connections:    Talks on phone: Not on file    Gets together: Not on file    Attends religious service: Not on file    Active member of club or organization: Not on file    Attends meetings of clubs or organizations: Not on file    Relationship status: Not on file  Other Topics Concern  . Not on file  Social History Narrative  . Not on file   Allergies  Allergen Reactions  . Sulfur Hives  . Sulfa Antibiotics Hives and Rash    Reaction not listed   Family History  Problem Relation Age of Onset  . Breast cancer Mother 71       had hysterectomy at 65  . Skin cancer Brother        had a few removed over lifetime, one was on chest.  Is now 35  . Stomach cancer Maternal Grandmother   . Lung cancer Paternal Grandmother  69       dx. early 20's died 8  . Lung cancer Paternal Grandfather 48       dx in 80's, died at 78  . Prostate cancer Maternal Uncle 70       died shortly after in 68's.  Metastatic- spread to brain and many other locations  . Colon cancer Neg Hx   . Esophageal cancer Neg Hx   . Pancreatic cancer Neg Hx   . Rectal cancer Neg Hx     Current Outpatient Medications (Endocrine & Metabolic):  Marland Kitchen  ARMOUR THYROID PO, Take 60 mg by mouth 2 (two) times daily.  Marland Kitchen  liothyronine (CYTOMEL) 25 MCG tablet, Take 50 mcg by mouth 2 (two) times daily.  .  progesterone (PROMETRIUM) 200 MG capsule, Take 400 mg by mouth 2 (two) times daily.   Current Outpatient Medications (Respiratory):  .  levocetirizine (XYZAL) 5 MG tablet, Take 5 mg by mouth every evening.    Current Outpatient Medications (Hematological):  Marland Kitchen  Cyanocobalamin (VITAMIN B-12 PO), Take 1 mg by mouth daily. .  IRON PO, Take 29 mg by mouth daily.  Current Outpatient Medications (Other):   .  cholecalciferol (VITAMIN D) 400 units TABS tablet, Take 400 Units by mouth. .  cyclobenzaprine (FLEXERIL) 10 MG tablet, Take 1 tablet (10 mg total) by mouth at bedtime. Marland Kitchen  EVENING PRIMROSE OIL PO, Take 1 tablet by mouth daily. Marland Kitchen  gabapentin (NEURONTIN) 100 MG capsule, Take 2 capsules (200 mg total) by mouth at bedtime. Marland Kitchen  MAGNESIUM CITRATE PO*, Take 1 tablet by mouth daily. .  Menaquinone-7 (VITAMIN K2 PO), Take 150 mcg by mouth daily. .  Multiple Vitamins-Minerals (MULTIVITAMIN ADULT PO), Take 1 tablet by mouth daily. .  Probiotic Product (PROBIOTIC PO), Take 1 tablet by mouth daily. .  Vitamin D, Ergocalciferol, (DRISDOL) 50000 units CAPS capsule, TAKE ONE CAPSULE WEEKLY * These medications belong to multiple therapeutic classes and are listed under each applicable group.    Past medical history, social, surgical and family history all reviewed in electronic medical record.  [name] pertanent information unless stated regarding to the chief complaint.   Review of Systems:  [name] headache, visual changes, nausea, vomiting, diarrhea, constipation, dizziness, abdominal pain, skin rash, fevers, chills, night sweats, weight loss, swollen lymph nodes, body aches, joint swelling, , chest pain, shortness of breath, mood changes.  Mild positive muscle aches  Objective  Blood pressure 110/80, pulse 89, height 5' 6.5" (1.689 m), weight 173 lb (78.5 kg), SpO2 97 %.   General: [name] apparent distress alert and oriented x3 mood and affect normal, dressed appropriately.  HEENT: Pupils equal, extraocular movements intact  Respiratory: Patient's speak in full sentences and does not appear short of breath  Cardiovascular: [name] lower extremity edema, non tender, [name] erythema  Skin: Warm dry intact with [name] signs of infection or rash on extremities or on axial skeleton.  Abdomen: Soft nontender  Neuro: Cranial nerves II through XII are intact, neurovascularly intact in all extremities with 2+ DTRs and 2+ pulses.    Lymph: [name] lymphadenopathy of posterior or anterior cervical chain or axillae bilaterally.  Gait normal with good balance and coordination.  MSK:  Non tender with full range of motion and good stability and symmetric strength and tone of shoulders, elbows, wrist, hip, knee and ankles bilaterally.  Back Exam:  Inspection: Unremarkable  Motion: Flexion 35 deg, Extension 25 deg, Side Bending to 35 deg bilaterally,  Rotation to 35 deg bilaterally  SLR  laying: Negative  XSLR laying: Negative  Palpable tenderness: Tender to palpation the paraspinal musculature lumbar spine right greater than left. FABER: Positive Faber bilaterally. Sensory change: Gross sensation intact to all lumbar and sacral dermatomes.  Reflexes: 2+ at both patellar tendons, 2+ at achilles tendons, Babinski's downgoing.  Strength at foot  Plantar-flexion: 5/5 Dorsi-flexion: 5/5 Eversion: 5/5 Inversion: 5/5  Leg strength  Quad: 5/5 Hamstring: 5/5 Hip flexor: 5/5 Hip abductors: 4/5 but symmetric  Foot exam shows the patient does have a narrow foot bilaterally.  Mild overpronation of the hindfoot bilaterally.  Positive squeeze test on the left side with pain between the third and fourth metatarsal heads.  Osteopathic findings C6 flexed rotated and side bent left T4 extended rotated and side bent right inhaled rib T5 extended rotated and side bent left L3 flexed rotated and side bent right Sacrum right on right   Procedure: Real-time Ultrasound Guided Injection of left foot neuroma Device: GE Logiq Q7 Ultrasound guided injection is preferred based studies that show increased duration, increased effect, greater accuracy, decreased procedural pain, increased response rate, and decreased cost with ultrasound guided versus blind injection.  Verbal informed consent obtained.  Time-out conducted.  Noted [name] overlying erythema, induration, or other signs of local infection.  Skin prepped in a sterile fashion.  Local anesthesia:  Topical Ethyl chloride.  With sterile technique and under real time ultrasound guidance: With a 25-gauge half inch needle injected with 0.5 cc of 0.5% Marcaine and 0.5 cc of Kenalog 40 mg/mL Completed without difficulty  Pain immediately resolved suggesting accurate placement of the medication.  Advised to call if fevers/chills, erythema, induration, drainage, or persistent bleeding.  Images permanently stored and available for review in the ultrasound unit.  Impression: Technically successful ultrasound guided injection.    Impression and Recommendations:     This case required medical decision making of moderate complexity. The above documentation has been reviewed and is accurate and complete Lyndal Pulley, DO       Note: This dictation was prepared with Dragon dictation along with smaller phrase technology. Any transcriptional errors that result from this process are unintentional.

## 2018-05-22 ENCOUNTER — Ambulatory Visit: Payer: Self-pay

## 2018-05-22 ENCOUNTER — Ambulatory Visit: Payer: BLUE CROSS/BLUE SHIELD | Admitting: Family Medicine

## 2018-05-22 ENCOUNTER — Encounter: Payer: Self-pay | Admitting: Family Medicine

## 2018-05-22 VITALS — BP 110/80 | HR 89 | Ht 66.5 in | Wt 173.0 lb

## 2018-05-22 DIAGNOSIS — M79672 Pain in left foot: Secondary | ICD-10-CM | POA: Diagnosis not present

## 2018-05-22 DIAGNOSIS — M999 Biomechanical lesion, unspecified: Secondary | ICD-10-CM | POA: Diagnosis not present

## 2018-05-22 DIAGNOSIS — G5762 Lesion of plantar nerve, left lower limb: Secondary | ICD-10-CM

## 2018-05-22 DIAGNOSIS — M2141 Flat foot [pes planus] (acquired), right foot: Secondary | ICD-10-CM

## 2018-05-22 DIAGNOSIS — M2142 Flat foot [pes planus] (acquired), left foot: Secondary | ICD-10-CM

## 2018-05-22 NOTE — Assessment & Plan Note (Signed)
Decision today to treat with OMT was based on Physical Exam  After verbal consent patient was treated with HVLA, ME, FPR techniques in cervical, thoracic, rib lumbar and sacral areas  Patient tolerated the procedure well with improvement in symptoms  Patient given exercises, stretches and lifestyle modifications  See medications in patient instructions if given  Patient will follow up in 4-8 weeks 

## 2018-05-22 NOTE — Patient Instructions (Signed)
Good to see you  Injected the neuroma  We will call you when we get the orthotics in  Stay active Avoid being barefoot and heels.  See me again in 6-8 weeks

## 2018-05-22 NOTE — Assessment & Plan Note (Signed)
Likely contributing to some of the pain.  Discussed posture and ergonomics.  Patient will be fitted with custom orthotics with her being greater than 54 years old.

## 2018-06-09 ENCOUNTER — Other Ambulatory Visit: Payer: Self-pay

## 2018-06-09 ENCOUNTER — Ambulatory Visit: Payer: BLUE CROSS/BLUE SHIELD | Admitting: Family Medicine

## 2018-06-09 DIAGNOSIS — M2141 Flat foot [pes planus] (acquired), right foot: Secondary | ICD-10-CM | POA: Diagnosis not present

## 2018-06-09 DIAGNOSIS — M2142 Flat foot [pes planus] (acquired), left foot: Secondary | ICD-10-CM

## 2018-06-09 MED ORDER — CYCLOBENZAPRINE HCL 10 MG PO TABS: 10.0000 mg | ORAL_TABLET | Freq: Every day | ORAL | 0 refills | Status: DC

## 2018-06-09 NOTE — Assessment & Plan Note (Signed)
Orthotics made today.  Discussed with her over the course the next week increasing the amount of duration of wear.  Discussed how adjustments may be necessary.  Discussed proper alignment.  Follow-up again in 4 weeks

## 2018-06-09 NOTE — Progress Notes (Signed)
Procedure Note   Patient was fitted for a : standard, cushioned, semi-rigid orthotic. The orthotic was heated and afterward the patient patient seated position and molded The patient was positioned in subtalar neutral position and 10 degrees of ankle dorsiflexion in a weight bearing stance. After completion of molding, patient did have orthotic management The blank was ground to a stable position for weight bearing. Size: 10 Base: Carbon fiber Additional Posting and Padding: Left and right medial 250/35 transverse 250/70, 270/90 The patient ambulated these, and they were very comfortable.

## 2018-07-11 NOTE — Progress Notes (Signed)
[name] [place], [place] [address] Phone: [phone] Subjective:    I'm seeing this patient by the request  of:    CC: Bilateral foot pain, back pain  [id number]  [name] is a 52 y.o. female coming in with complaint of bilateral foot pain. Here for an adjustment. Feet are doing better. Still feels heat in the bottom of her feet. Also has shooting sharp pain with certain movements. Has been exercising more. Also wants to check pain in her distal ulnar. Has been checked before.       Past Medical History:  Diagnosis Date  . 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 Family history of breast cancer   . Family history of prostate cancer   . Interstitial cystitis    mild  . Neoplasm of appendix   . Thyroid disease    Past Surgical History:  Procedure Laterality Date  . APPENDECTOMY  08/2006  . COLONOSCOPY  04/17/2007  . TUBAL LIGATION  11/1998  . UMBILICAL HERNIA REPAIR  10/2016   Social History   Socioeconomic History  . Marital status: Married    Spouse name: Not on file  . Number of children: Not on file  . Years of education: Not on file  . Highest education level: Not on file  Occupational History  . Not on file  Social Needs  . Financial resource strain: Not on file  . Food insecurity:    Worry: Not on file    Inability: Not on file  . Transportation needs:    Medical: Not on file    Non-medical: Not on file  Tobacco Use  . Smoking status: Never Smoker  . Smokeless tobacco: Never Used  Substance and Sexual Activity  . Alcohol use: Yes    Alcohol/week: 5.0 standard drinks    Types: 5 Glasses of wine per week    Comment: occasional  . Drug use: No  . Sexual activity: Not on file  Lifestyle  . Physical activity:    Days per week: Not on file    Minutes per session: Not on file  . Stress: Not on file  Relationships  . Social connections:    Talks on phone: Not on file   Gets together: Not on file    Attends religious service: Not on file    Active member of club or organization: Not on file    Attends meetings of clubs or organizations: Not on file    Relationship status: Not on file  Other Topics Concern  . Not on file  Social History Narrative  . Not on file   Allergies  Allergen Reactions  . Sulfur Hives  . Sulfa Antibiotics Hives and Rash    Reaction not listed   Family History  Problem Relation Age of Onset  . Breast cancer Mother 39       had hysterectomy at 64  . Skin cancer Brother        had a few removed over lifetime, one was on chest.  Is now 4  . Stomach cancer Maternal Grandmother   . Lung cancer Paternal Grandmother 24       dx. early 80's died 53  . Lung cancer Paternal Grandfather 66       dx in 5's, died at 5  . Prostate cancer Maternal Uncle 70       died shortly after in 26's.  Metastatic- spread to  brain and many other locations  . Colon cancer Neg Hx   . Esophageal cancer Neg Hx   . Pancreatic cancer Neg Hx   . Rectal cancer Neg Hx     Current Outpatient Medications (Endocrine & Metabolic):  Marland Kitchen  ARMOUR THYROID PO, Take 60 mg by mouth 2 (two) times daily.  Marland Kitchen  liothyronine (CYTOMEL) 25 MCG tablet, Take 50 mcg by mouth 2 (two) times daily.  .  progesterone (PROMETRIUM) 200 MG capsule, Take 400 mg by mouth 2 (two) times daily.   Current Outpatient Medications (Respiratory):  .  levocetirizine (XYZAL) 5 MG tablet, Take 5 mg by mouth every evening.    Current Outpatient Medications (Hematological):  Marland Kitchen  Cyanocobalamin (VITAMIN B-12 PO), Take 1 mg by mouth daily. .  IRON PO, Take 29 mg by mouth daily.  Current Outpatient Medications (Other):  .  cholecalciferol (VITAMIN D) 400 units TABS tablet, Take 400 Units by mouth. .  cyclobenzaprine (FLEXERIL) 10 MG tablet, Take 1 tablet (10 mg total) by mouth at bedtime. Marland Kitchen  EVENING PRIMROSE OIL PO, Take 1 tablet by mouth daily. Marland Kitchen  gabapentin (NEURONTIN) 100 MG capsule, Take  2 capsules (200 mg total) by mouth at bedtime. (Patient not taking: Reported on 06/09/2018) .  MAGNESIUM CITRATE PO*, Take 1 tablet by mouth daily. .  Menaquinone-7 (VITAMIN K2 PO), Take 150 mcg by mouth daily. .  Multiple Vitamins-Minerals (MULTIVITAMIN ADULT PO), Take 1 tablet by mouth daily. .  Probiotic Product (PROBIOTIC PO), Take 1 tablet by mouth daily. .  Vitamin D, Ergocalciferol, (DRISDOL) 50000 units CAPS capsule, TAKE ONE CAPSULE WEEKLY (Patient not taking: Reported on 06/09/2018) * These medications belong to multiple therapeutic classes and are listed under each applicable group.    Past medical history, social, surgical and family history all reviewed in electronic medical record.  No pertanent information unless stated regarding to the chief complaint.   Review of Systems:  No headache, visual changes, nausea, vomiting, diarrhea, constipation, dizziness, abdominal pain, skin rash, fevers, chills, night sweats, weight loss, swollen lymph nodes, body aches, joint swelling, muscle aches, chest pain, shortness of breath, mood changes.   Objective  Blood pressure 114/78, pulse 81, height 5' 6.5" (1.689 m), weight 168 lb (76.2 kg), SpO2 97 %.   General: No apparent distress alert and oriented x3 mood and affect normal, dressed appropriately.  HEENT: Pupils equal, extraocular movements intact  Respiratory: Patient's speak in full sentences and does not appear short of breath  Cardiovascular: No lower extremity edema, non tender, no erythema  Skin: Warm dry intact with no signs of infection or rash on extremities or on axial skeleton.  Abdomen: Soft nontender  Neuro: Cranial nerves II through XII are intact, neurovascularly intact in all extremities with 2+ DTRs and 2+ pulses.  Lymph: No lymphadenopathy of posterior or anterior cervical chain or axillae bilaterally.  Gait normal with good balance and coordination.  MSK:  Non tender with full range of motion and good stability and  symmetric strength and tone of shoulders, elbows, wrist, hip, knee and ankles bilaterally.  Foot exam bilaterally shows that patient does have narrow feet.  Still positive squeeze test.  Tenderness between the fourth and fifth metatarsal heads.  Breakdown of the longitudinal and transverse arch noted.  Back exam shows the patient does have some tenderness in the parascapular region on the left side.  Tightness of the musculature in this area with multiple trigger points.  Patient also has tightness in the thoracolumbar juncture  right greater than left.  Mild positive tightness with Corky Sox test on the right.  Straight leg test negative.  Osteopathic findings C2 flexed rotated and side bent right C6 flexed rotated and side bent left T3 extended rotated and side bent right inhaled third rib T9 extended rotated and side bent left L4 flexed rotated and side bent right Sacrum right on right     Impression and Recommendations:     This case required medical decision making of moderate complexity. The above documentation has been reviewed and is accurate and complete Lyndal Pulley, DO       Note: This dictation was prepared with Dragon dictation along with smaller phrase technology. Any transcriptional errors that result from this process are unintentional.

## 2018-07-12 ENCOUNTER — Ambulatory Visit: Payer: BLUE CROSS/BLUE SHIELD | Admitting: Family Medicine

## 2018-07-12 ENCOUNTER — Encounter: Payer: Self-pay | Admitting: Family Medicine

## 2018-07-12 VITALS — BP 114/78 | HR 81 | Ht 66.5 in | Wt 168.0 lb

## 2018-07-12 DIAGNOSIS — M2141 Flat foot [pes planus] (acquired), right foot: Secondary | ICD-10-CM | POA: Diagnosis not present

## 2018-07-12 DIAGNOSIS — M545 Low back pain: Secondary | ICD-10-CM

## 2018-07-12 DIAGNOSIS — M2142 Flat foot [pes planus] (acquired), left foot: Secondary | ICD-10-CM

## 2018-07-12 DIAGNOSIS — M999 Biomechanical lesion, unspecified: Secondary | ICD-10-CM | POA: Diagnosis not present

## 2018-07-12 DIAGNOSIS — G8929 Other chronic pain: Secondary | ICD-10-CM

## 2018-07-12 NOTE — Assessment & Plan Note (Signed)
Multifactorial.  Discussed continue to work on the core strength.  Discussed icing regimen and home exercises, discussed which activities to doing which wants to avoid.  Follow-up again in 4 to 8 weeks

## 2018-07-12 NOTE — Assessment & Plan Note (Signed)
Likely causing some problems with patient's back and alignment.  Encouraged her to continue to wear the custom orthotics and good shoes, discussed icing regimen and home exercises, discussed which activities to do which wants to avoid.  Increase activity slowly over the course of next several days.  Follow-up with me again in 4 to 8 weeks

## 2018-07-12 NOTE — Patient Instructions (Signed)
Always good to see you  [facility] your son feels better Keep wearing good shoes.  Read about PRP for the neuroma \\Try  a counterforce brace on the forearm with a lot of lifting  See me again in 6-8 weeks

## 2018-07-12 NOTE — Assessment & Plan Note (Signed)
Decision today to treat with OMT was based on Physical Exam  After verbal consent patient was treated with HVLA, ME, FPR techniques in cervical, thoracic, rib,  lumbar and sacral areas  Patient tolerated the procedure well with improvement in symptoms  Patient given exercises, stretches and lifestyle modifications  See medications in patient instructions if given  Patient will follow up in 4-8 weeks 

## 2018-08-21 NOTE — Progress Notes (Signed)
[name] [place], [place] [address] Phone: [phone] Subjective:    I [name] am serving as a Education administrator for Dr. Hulan Saas.    CC: Back pain and foot pain follow-up  [id number]  [name] is a 52 y.o. female coming in with complaint of back pain. States [place]. Posterior right leg is painful as well as the back. Walked about 3 miles.  Patient has noticed more tightness.  Has responded to manipulation in the past.  Patient describes the pain as a dull, throbbing aching sensation.  Nothing quite so severe though it is stopping her from activity.  Patient is also having more foot pain again.  Has had a Morton's neuroma.  Had but did have some mild atrophy of the surrounding area secondary to the steroid.  Wants to avoid another steroid injection.  Started have some mild increase in discomfort again.  Would like to consider the possibility of some other treatment.      Past Medical History:  Diagnosis Date  . 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 Family history of breast cancer   . Family history of prostate cancer   . Interstitial cystitis    mild  . Neoplasm of appendix   . Thyroid disease    Past Surgical History:  Procedure Laterality Date  . APPENDECTOMY  08/2006  . COLONOSCOPY  04/17/2007  . TUBAL LIGATION  11/1998  . UMBILICAL HERNIA REPAIR  10/2016   Social History   Socioeconomic History  . Marital status: Married    Spouse name: Not on file  . Number of children: Not on file  . Years of education: Not on file  . Highest education level: Not on file  Occupational History  . Not on file  Social Needs  . Financial resource strain: Not on file  . Food insecurity:    Worry: Not on file    Inability: Not on file  . Transportation needs:    Medical: Not on file    Non-medical: Not on file  Tobacco Use  . Smoking status: Never  Smoker  . Smokeless tobacco: Never Used  Substance and Sexual Activity  . Alcohol use: Yes    Alcohol/week: 5.0 standard drinks    Types: 5 Glasses of wine per week    Comment: occasional  . Drug use: No  . Sexual activity: Not on file  Lifestyle  . Physical activity:    Days per week: Not on file    Minutes per session: Not on file  . Stress: Not on file  Relationships  . Social connections:    Talks on phone: Not on file    Gets together: Not on file    Attends religious service: Not on file    Active member of club or organization: Not on file    Attends meetings of clubs or organizations: Not on file    Relationship status: Not on file  Other Topics Concern  . Not on file  Social History Narrative  . Not on file   Allergies  Allergen Reactions  . Sulfur Hives  . Sulfa Antibiotics Hives and Rash    Reaction not listed   Family History  Problem Relation Age of Onset  . Breast cancer Mother 79       had hysterectomy at 31  . Skin cancer Brother  had a few removed over lifetime, one was on chest.  Is now 17  . Stomach cancer Maternal Grandmother   . Lung cancer Paternal Grandmother 61       dx. early 79's died 86  . Lung cancer Paternal Grandfather 71       dx in 85's, died at 72  . Prostate cancer Maternal Uncle 70       died shortly after in 62's.  Metastatic- spread to brain and many other locations  . Colon cancer Neg Hx   . Esophageal cancer Neg Hx   . Pancreatic cancer Neg Hx   . Rectal cancer Neg Hx     Current Outpatient Medications (Endocrine & Metabolic):  Marland Kitchen  ARMOUR THYROID PO, Take 60 mg by mouth 2 (two) times daily.  Marland Kitchen  liothyronine (CYTOMEL) 25 MCG tablet, Take 50 mcg by mouth 2 (two) times daily.  .  progesterone (PROMETRIUM) 200 MG capsule, Take 400 mg by mouth 2 (two) times daily.   Current Outpatient Medications (Respiratory):  .  levocetirizine (XYZAL) 5 MG tablet, Take 5 mg by mouth every evening.    Current Outpatient Medications  (Hematological):  Marland Kitchen  Cyanocobalamin (VITAMIN B-12 PO), Take 1 mg by mouth daily. .  IRON PO, Take 29 mg by mouth daily.  Current Outpatient Medications (Other):  .  cholecalciferol (VITAMIN D) 400 units TABS tablet, Take 400 Units by mouth. Marland Kitchen  MAGNESIUM CITRATE PO*, Take 1 tablet by mouth daily. .  Menaquinone-7 (VITAMIN K2 PO), Take 150 mcg by mouth daily. .  Multiple Vitamins-Minerals (MULTIVITAMIN ADULT PO), Take 1 tablet by mouth daily. .  Probiotic Product (PROBIOTIC PO), Take 1 tablet by mouth daily. .  cyclobenzaprine (FLEXERIL) 10 MG tablet, Take 1 tablet (10 mg total) by mouth at bedtime. (Patient not taking: Reported on 08/22/2018) .  EVENING PRIMROSE OIL PO, Take 1 tablet by mouth daily. Marland Kitchen  gabapentin (NEURONTIN) 100 MG capsule, Take 2 capsules (200 mg total) by mouth at bedtime. (Patient not taking: Reported on 08/22/2018) .  Vitamin D, Ergocalciferol, (DRISDOL) 50000 units CAPS capsule, TAKE ONE CAPSULE WEEKLY (Patient not taking: Reported on 08/22/2018) * These medications belong to multiple therapeutic classes and are listed under each applicable group.    Past medical history, social, surgical and family history all reviewed in electronic medical record.  No pertanent information unless stated regarding to the chief complaint.   Review of Systems:  No headache, visual changes, nausea, vomiting, diarrhea, constipation, dizziness, abdominal pain, skin rash, fevers, chills, night sweats, weight loss, swollen lymph nodes, body aches, joint swelling, muscle aches, chest pain, shortness of breath, mood changes.   Objective  Blood pressure 110/80, pulse 81, height 5' 6.5" (1.689 m), weight 168 lb (76.2 kg), SpO2 98 %.   General: No apparent distress alert and oriented x3 mood and affect normal, dressed appropriately.  HEENT: Pupils equal, extraocular movements intact  Respiratory: Patient's speak in full sentences and does not appear short of breath  Cardiovascular: No lower  extremity edema, non tender, no erythema  Skin: Warm dry intact with no signs of infection or rash on extremities or on axial skeleton.  Abdomen: Soft nontender  Neuro: Cranial nerves II through XII are intact, neurovascularly intact in all extremities with 2+ DTRs and 2+ pulses.  Lymph: No lymphadenopathy of posterior or anterior cervical chain or axillae bilaterally.  Gait normal with good balance and coordination.  MSK:  Non tender with full range of motion and good stability and  symmetric strength and tone of shoulders, elbows, wrist, hip, knee and ankles bilaterally.  Back Exam:  Inspection: Mild loss of lordosis Motion: Flexion 45 deg, Extension 25 deg, Side Bending to 35 deg bilaterally,  Rotation to 35 deg bilaterally  SLR laying: Negative  XSLR laying: Negative  Palpable tenderness: Tender to palpation in the paraspinal musculature of the lumbar spine right greater than left FABER: Positive Faber. Sensory change: Gross sensation intact to all lumbar and sacral dermatomes.  Reflexes: 2+ at both patellar tendons, 2+ at achilles tendons, Babinski's downgoing.  Strength at foot  Plantar-flexion: 5/5 Dorsi-flexion: 5/5 Eversion: 5/5 Inversion: 5/5  Leg strength  Quad: 5/5 Hamstring: 5/5 Hip flexor: 5/5 Hip abductors: 4/5   Osteopathic findings C2 flexed rotated and side bent right C4 flexed rotated and side bent left C6 flexed rotated and side bent left T3 extended rotated and side bent right inhaled third rib T6 extended rotated and side bent left L2 flexed rotated and side bent right Sacrum right on right     Impression and Recommendations:     This case required medical decision making of moderate complexity. The above documentation has been reviewed and is accurate and complete Lyndal Pulley, DO       Note: This dictation was prepared with Dragon dictation along with smaller phrase technology. Any transcriptional errors that result from this process are  unintentional.

## 2018-08-22 ENCOUNTER — Ambulatory Visit: Payer: BLUE CROSS/BLUE SHIELD | Admitting: Family Medicine

## 2018-08-22 ENCOUNTER — Encounter: Payer: Self-pay | Admitting: Family Medicine

## 2018-08-22 VITALS — BP 110/80 | HR 81 | Ht 66.5 in | Wt 168.0 lb

## 2018-08-22 DIAGNOSIS — G8929 Other chronic pain: Secondary | ICD-10-CM | POA: Diagnosis not present

## 2018-08-22 DIAGNOSIS — M999 Biomechanical lesion, unspecified: Secondary | ICD-10-CM | POA: Diagnosis not present

## 2018-08-22 DIAGNOSIS — G5762 Lesion of plantar nerve, left lower limb: Secondary | ICD-10-CM

## 2018-08-22 DIAGNOSIS — M545 Low back pain, unspecified: Secondary | ICD-10-CM

## 2018-08-22 NOTE — Patient Instructions (Addendum)
Good to see you  You will do great make an appointment for PRP for the foot up front.  Happy holidays! See you soon

## 2018-08-22 NOTE — Assessment & Plan Note (Signed)
Back pain is doing relatively well at the moment.  Patient needs to do a significant amount more activity.  Likely this is contributing to some of the discomfort and pain.  Has been doing well will follow-up with me again in 6 to 8 weeks

## 2018-08-22 NOTE — Assessment & Plan Note (Signed)
Patient doing relatively well.  Discussed PRP.  Patient will be set up in the near future.  Follow-up with me for the manipulation in 6 to 8 weeks

## 2018-08-22 NOTE — Assessment & Plan Note (Signed)
Decision today to treat with OMT was based on Physical Exam  After verbal consent patient was treated with HVLA, ME, FPR techniques in cervical, thoracic, lumbar and sacral areas  Patient tolerated the procedure well with improvement in symptoms  Patient given exercises, stretches and lifestyle modifications  See medications in patient instructions if given  Patient will follow up in 6-8 weeks 

## 2018-08-24 ENCOUNTER — Ambulatory Visit
Admission: RE | Admit: 2018-08-24 | Discharge: 2018-08-24 | Disposition: A | Discharge status: Home / Self Care | Payer: BLUE CROSS/BLUE SHIELD | Source: Ambulatory Visit | Attending: Obstetrics and Gynecology | Admitting: Obstetrics and Gynecology

## 2018-08-24 DIAGNOSIS — Z803 Family history of malignant neoplasm of breast: Secondary | ICD-10-CM

## 2018-08-24 MED ORDER — GADOBUTROL 1 MMOL/ML IV SOLN
7.0000 mL | Freq: Once | INTRAVENOUS | Status: AC | PRN
  Administered 2018-08-24: 7 mL via INTRAVENOUS

## 2018-09-16 NOTE — Progress Notes (Signed)
[name] [address] [place], [place] [address] Phone: [phone] Subjective:     CC: Left foot pain  [id number]  [name] is a 53 y.o. female coming in with complaint of left foot pain. Is here for PRP for Morton's neuroma.   Patient has tried everything else and failed all other conservative therapy.  Does not want any surgical intervention.    Past Medical History:  Diagnosis Date  . 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 Family history of breast cancer   . Family history of prostate cancer   . Interstitial cystitis    mild  . Neoplasm of appendix   . Thyroid disease    Past Surgical History:  Procedure Laterality Date  . APPENDECTOMY  08/2006  . COLONOSCOPY  04/17/2007  . TUBAL LIGATION  11/1998  . UMBILICAL HERNIA REPAIR  10/2016   Social History   Socioeconomic History  . Marital status: Married    Spouse name: Not on file  . Number of children: Not on file  . Years of education: Not on file  . Highest education level: Not on file  Occupational History  . Not on file  Social Needs  . Financial resource strain: Not on file  . Food insecurity:    Worry: Not on file    Inability: Not on file  . Transportation needs:    Medical: Not on file    Non-medical: Not on file  Tobacco Use  . Smoking status: Never Smoker  . Smokeless tobacco: Never Used  Substance and Sexual Activity  . Alcohol use: Yes    Alcohol/week: 5.0 standard drinks    Types: 5 Glasses of wine per week    Comment: occasional  . Drug use: No  . Sexual activity: Not on file  Lifestyle  . Physical activity:    Days per week: Not on file    Minutes per session: Not on file  . Stress: Not on file  Relationships  . Social connections:    Talks on phone: Not on file    Gets together: Not on file    Attends religious service: Not on file    Active member of club or organization: Not on file    Attends meetings of clubs or  organizations: Not on file    Relationship status: Not on file  Other Topics Concern  . Not on file  Social History Narrative  . Not on file   Allergies  Allergen Reactions  . Sulfur Hives  . Sulfa Antibiotics Hives and Rash    Reaction not listed   Family History  Problem Relation Age of Onset  . Breast cancer Mother 56       had hysterectomy at 32  . Skin cancer Brother        had a few removed over lifetime, one was on chest.  Is now 106  . Stomach cancer Maternal Grandmother   . Lung cancer Paternal Grandmother 70       dx. early 31's died 47  . Lung cancer Paternal Grandfather 15       dx in 20's, died at 24  . Prostate cancer Maternal Uncle 70       died shortly after in 63's.  Metastatic- spread to brain and many other locations  . Colon cancer Neg Hx   . Esophageal cancer Neg Hx   . Pancreatic cancer Neg Hx   . Rectal  cancer Neg Hx     Current Outpatient Medications (Endocrine & Metabolic):  Marland Kitchen  ARMOUR THYROID PO, Take 60 mg by mouth 2 (two) times daily.  Marland Kitchen  liothyronine (CYTOMEL) 25 MCG tablet, Take 50 mcg by mouth 2 (two) times daily.  .  progesterone (PROMETRIUM) 200 MG capsule, Take 400 mg by mouth 2 (two) times daily.   Current Outpatient Medications (Respiratory):  .  levocetirizine (XYZAL) 5 MG tablet, Take 5 mg by mouth every evening.    Current Outpatient Medications (Hematological):  Marland Kitchen  Cyanocobalamin (VITAMIN B-12 PO), Take 1 mg by mouth daily. .  IRON PO, Take 29 mg by mouth daily.  Current Outpatient Medications (Other):  .  cholecalciferol (VITAMIN D) 400 units TABS tablet, Take 400 Units by mouth. .  cyclobenzaprine (FLEXERIL) 10 MG tablet, Take 1 tablet (10 mg total) by mouth at bedtime. (Patient not taking: Reported on 08/22/2018) .  EVENING PRIMROSE OIL PO, Take 1 tablet by mouth daily. Marland Kitchen  gabapentin (NEURONTIN) 100 MG capsule, Take 2 capsules (200 mg total) by mouth at bedtime. (Patient not taking: Reported on 08/22/2018) .  MAGNESIUM  CITRATE PO*, Take 1 tablet by mouth daily. .  Menaquinone-7 (VITAMIN K2 PO), Take 150 mcg by mouth daily. .  Multiple Vitamins-Minerals (MULTIVITAMIN ADULT PO), Take 1 tablet by mouth daily. .  Probiotic Product (PROBIOTIC PO), Take 1 tablet by mouth daily. .  Vitamin D, Ergocalciferol, (DRISDOL) 50000 units CAPS capsule, TAKE ONE CAPSULE WEEKLY (Patient not taking: Reported on 08/22/2018) * These medications belong to multiple therapeutic classes and are listed under each applicable group.    Past medical history, social, surgical and family history all reviewed in electronic medical record.  No pertanent information unless stated regarding to the chief complaint.   Review of Systems:  No headache, visual changes, nausea, vomiting, diarrhea, constipation, dizziness, abdominal pain, skin rash, fevers, chills, night sweats, weight loss, swollen lymph nodes, body aches, joint swelling, muscle aches, chest pain, shortness of breath, mood changes.   Objective  Height 5' 6.5" (1.689 m).    MSK:  Non tender with full range of motion and good stability and symmetric strength and tone of shoulders, elbows, wrist, hip, knee and ankles bilaterally.  Left foot exam shows some loss of lordosis spine between the first and second toes.  Patient does have a positive squeeze test.  Pain between the third and fourth metatarsal.  Procedure: Real-time Ultrasound Guided Injection of left foot neuroma Device: GE Logiq Q7 Ultrasound guided injection is preferred based studies that show increased duration, increased effect, greater accuracy, decreased procedural pain, increased response rate, and decreased cost with ultrasound guided versus blind injection.  Verbal informed consent obtained.  Time-out conducted.  Noted no overlying erythema, induration, or other signs of local infection.  Skin prepped in a sterile fashion.  Local anesthesia: Topical Ethyl chloride.  With sterile technique and under real time  ultrasound guidance: With a 21-gauge half inch needle patient was injected with 0.5 cc of 0.5% Marcaine and then injected with 4 cc of pre-centrifuge PRP. Completed without difficulty  Pain immediately resolved suggesting accurate placement of the medication.  Advised to call if fevers/chills, erythema, induration, drainage, or persistent bleeding.  Images permanently stored and available for review in the ultrasound unit.  Impression: Technically successful ultrasound guided injection.   Impression and Recommendations:     This case required medical decision making of moderate complexity. The above documentation has been reviewed and is accurate and complete Alroy Dust  Koren Bound, DO       Note: This dictation was prepared with Dragon dictation along with smaller phrase technology. Any transcriptional errors that result from this process are unintentional.

## 2018-09-18 ENCOUNTER — Ambulatory Visit (INDEPENDENT_AMBULATORY_CARE_PROVIDER_SITE_OTHER): Payer: BLUE CROSS/BLUE SHIELD | Admitting: Family Medicine

## 2018-09-18 ENCOUNTER — Ambulatory Visit (INDEPENDENT_AMBULATORY_CARE_PROVIDER_SITE_OTHER)
Admission: RE | Admit: 2018-09-18 | Discharge: 2018-09-18 | Disposition: A | Discharge status: Home / Self Care | Payer: BLUE CROSS/BLUE SHIELD | Source: Ambulatory Visit | Attending: Family Medicine | Admitting: Family Medicine

## 2018-09-18 ENCOUNTER — Ambulatory Visit: Payer: Self-pay

## 2018-09-18 ENCOUNTER — Other Ambulatory Visit: Payer: BLUE CROSS/BLUE SHIELD

## 2018-09-18 VITALS — Ht 66.5 in

## 2018-09-18 DIAGNOSIS — M545 Low back pain: Secondary | ICD-10-CM | POA: Diagnosis not present

## 2018-09-18 DIAGNOSIS — G8929 Other chronic pain: Secondary | ICD-10-CM

## 2018-09-18 DIAGNOSIS — M62838 Other muscle spasm: Secondary | ICD-10-CM

## 2018-09-18 DIAGNOSIS — G5762 Lesion of plantar nerve, left lower limb: Secondary | ICD-10-CM

## 2018-09-18 NOTE — Assessment & Plan Note (Signed)
Patient given injection today.  Patient knows that this likely will be a little irritated.  We did warned on different side effects and when to seek medical attention.  Patient is to increase activity slowly over the course of several days.  Patient will follow-up with me again 3 weeks

## 2018-09-18 NOTE — Patient Instructions (Signed)
Good to see you  Continue the conservative therapy  Try more with heat and tylenol next 3 days  Follow instructions  See me again in 3 weeks

## 2018-10-12 ENCOUNTER — Encounter: Payer: Self-pay | Admitting: Family Medicine

## 2018-10-12 ENCOUNTER — Ambulatory Visit: Payer: Self-pay

## 2018-10-12 ENCOUNTER — Ambulatory Visit: Payer: BLUE CROSS/BLUE SHIELD | Admitting: Family Medicine

## 2018-10-12 VITALS — BP 112/76 | HR 92 | Ht 66.5 in | Wt 173.0 lb

## 2018-10-12 DIAGNOSIS — M79672 Pain in left foot: Secondary | ICD-10-CM | POA: Diagnosis not present

## 2018-10-12 DIAGNOSIS — R5383 Other fatigue: Secondary | ICD-10-CM | POA: Diagnosis not present

## 2018-10-12 DIAGNOSIS — G8929 Other chronic pain: Secondary | ICD-10-CM

## 2018-10-12 DIAGNOSIS — M545 Low back pain, unspecified: Secondary | ICD-10-CM

## 2018-10-12 DIAGNOSIS — M999 Biomechanical lesion, unspecified: Secondary | ICD-10-CM

## 2018-10-12 DIAGNOSIS — R6882 Decreased libido: Secondary | ICD-10-CM | POA: Diagnosis not present

## 2018-10-12 DIAGNOSIS — N951 Menopausal and female climacteric states: Secondary | ICD-10-CM | POA: Diagnosis not present

## 2018-10-12 DIAGNOSIS — E039 Hypothyroidism, unspecified: Secondary | ICD-10-CM | POA: Diagnosis not present

## 2018-10-12 NOTE — Progress Notes (Signed)
[name] [address], [place] [address] Phone: [phone] Subjective:   [name], am serving as a scribe for Dr. Hulan Saas.  I'm seeing this patient by the request  of:    CC: neck and foot pain follow up   [id number]   09/18/2018: Patient given injection today.  Patient knows that this likely will be a little irritated.  We did warned on different side effects and when to seek medical attention.  Patient is to increase activity slowly over the course of several days.  Patient will follow-up with me again 3 weeks Update 10/12/2018:  [name] is a 53 y.o. female coming in with complaint of left foot pain. Had pain for 3 days following injection. Said she has felt improvement. Does still have pain with walking and feels "heat." Did get on elliptical this week and did not have soreness following activity which she previously had.   Is also having back pain which she said is due to sitting in the hospital last week as her mom was sick. Has previously been seen for OMT to relieve her back pain.  Some mild tightness but nothing severe.     Past Medical History:  Diagnosis Date  . 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 Family history of breast cancer   . Family history of prostate cancer   . Interstitial cystitis    mild  . Neoplasm of appendix   . Thyroid disease    Past Surgical History:  Procedure Laterality Date  . APPENDECTOMY  08/2006  . COLONOSCOPY  04/17/2007  . TUBAL LIGATION  11/1998  . UMBILICAL HERNIA REPAIR  10/2016   Social History   Socioeconomic History  . Marital status: Married    Spouse name: Not on file  . Number of children: Not on file  . Years of education: Not on file  . Highest education level: Not on file  Occupational History  . Not on file  Social Needs  . Financial resource strain: Not on file  . Food insecurity:    Worry: Not on file    Inability: Not on file    . Transportation needs:    Medical: Not on file    Non-medical: Not on file  Tobacco Use  . Smoking status: Never Smoker  . Smokeless tobacco: Never Used  Substance and Sexual Activity  . Alcohol use: Yes    Alcohol/week: 5.0 standard drinks    Types: 5 Glasses of wine per week    Comment: occasional  . Drug use: No  . Sexual activity: Not on file  Lifestyle  . Physical activity:    Days per week: Not on file    Minutes per session: Not on file  . Stress: Not on file  Relationships  . Social connections:    Talks on phone: Not on file    Gets together: Not on file    Attends religious service: Not on file    Active member of club or organization: Not on file    Attends meetings of clubs or organizations: Not on file    Relationship status: Not on file  Other Topics Concern  . Not on file  Social History Narrative  . Not on file   Allergies  Allergen Reactions  . Sulfur Hives  . Sulfa Antibiotics Hives and Rash    Reaction not listed   Family History  Problem Relation Age of  Onset  . Breast cancer Mother 5       had hysterectomy at 29  . Skin cancer Brother        had a few removed over lifetime, one was on chest.  Is now 2  . Stomach cancer Maternal Grandmother   . Lung cancer Paternal Grandmother 79       dx. early 55's died 38  . Lung cancer Paternal Grandfather 48       dx in 53's, died at 22  . Prostate cancer Maternal Uncle 70       died shortly after in 10's.  Metastatic- spread to brain and many other locations  . Colon cancer Neg Hx   . Esophageal cancer Neg Hx   . Pancreatic cancer Neg Hx   . Rectal cancer Neg Hx     Current Outpatient Medications (Endocrine & Metabolic):  Marland Kitchen  ARMOUR THYROID PO, Take 60 mg by mouth 2 (two) times daily.  Marland Kitchen  liothyronine (CYTOMEL) 25 MCG tablet, Take 50 mcg by mouth 2 (two) times daily.  .  progesterone (PROMETRIUM) 200 MG capsule, Take 400 mg by mouth 2 (two) times daily.   Current Outpatient Medications  (Respiratory):  .  levocetirizine (XYZAL) 5 MG tablet, Take 5 mg by mouth every evening.    Current Outpatient Medications (Hematological):  Marland Kitchen  Cyanocobalamin (VITAMIN B-12 PO), Take 1 mg by mouth daily. .  IRON PO, Take 29 mg by mouth daily.  Current Outpatient Medications (Other):  .  cholecalciferol (VITAMIN D) 400 units TABS tablet, Take 400 Units by mouth. .  cyclobenzaprine (FLEXERIL) 10 MG tablet, Take 1 tablet (10 mg total) by mouth at bedtime. (Patient not taking: Reported on 08/22/2018) .  EVENING PRIMROSE OIL PO, Take 1 tablet by mouth daily. Marland Kitchen  gabapentin (NEURONTIN) 100 MG capsule, Take 2 capsules (200 mg total) by mouth at bedtime. (Patient not taking: Reported on 08/22/2018) .  MAGNESIUM CITRATE PO*, Take 1 tablet by mouth daily. .  Menaquinone-7 (VITAMIN K2 PO), Take 150 mcg by mouth daily. .  Multiple Vitamins-Minerals (MULTIVITAMIN ADULT PO), Take 1 tablet by mouth daily. .  Probiotic Product (PROBIOTIC PO), Take 1 tablet by mouth daily. .  Vitamin D, Ergocalciferol, (DRISDOL) 50000 units CAPS capsule, TAKE ONE CAPSULE WEEKLY (Patient not taking: Reported on 08/22/2018) * These medications belong to multiple therapeutic classes and are listed under each applicable group.    Past medical history, social, surgical and family history all reviewed in electronic medical record.  No pertanent information unless stated regarding to the chief complaint.   Review of Systems:  No headache, visual changes, nausea, vomiting, diarrhea, constipation, dizziness, abdominal pain, skin rash, fevers, chills, night sweats, weight loss, swollen lymph nodes, body aches, joint swelling,chest pain, shortness of breath, mood changes.  Positive muscle aches  Objective  Blood pressure 112/76, pulse 92, height 5' 6.5" (1.689 m), weight 173 lb (78.5 kg), SpO2 99 %.    General: No apparent distress alert and oriented x3 mood and affect normal, dressed appropriately.  HEENT: Pupils equal,  extraocular movements intact  Respiratory: Patient's speak in full sentences and does not appear short of breath  Cardiovascular: No lower extremity edema, non tender, no erythema  Skin: Warm dry intact with no signs of infection or rash on extremities or on axial skeleton.  Abdomen: Soft nontender  Neuro: Cranial nerves II through XII are intact, neurovascularly intact in all extremities with 2+ DTRs and 2+ pulses.  Lymph: No lymphadenopathy of  posterior or anterior cervical chain or axillae bilaterally.  Gait normal with good balance and coordination.  MSK:  Non tender with full range of motion and good stability and symmetric strength and tone of shoulders, elbows, wrist, hip, knee bilaterally.  Foot exam shows significant pes planus.  Patient does not have any tenderness to palpation over the area where the neuroma was previously.  Near full range of motion in the ankle.  Tender transverse arch noted Back Exam:  Inspection: Loss of lordosis Motion: Flexion 40 deg, Extension 25 deg, Side Bending to 35 deg bilaterally,  Rotation to 35 deg bilaterally  SLR laying: Negative  XSLR laying: Negative  Palpable tenderness: Patient's back pain.Marland Kitchen FABER: Positive Faber bilaterally. Sensory change: Gross sensation intact to all lumbar and sacral dermatomes.  Reflexes: 2+ at both patellar tendons, 2+ at achilles tendons, Babinski's downgoing.  Strength at foot  Plantar-flexion: 5/5 Dorsi-flexion: 5/5 Eversion: 5/5 Inversion: 5/5  Leg strength  Quad: 5/5 Hamstring: 5/5 Hip flexor: 5/5 Hip abductors: 5/5  Gait unremarkable.  Limited musculoskeletal ultrasound was performed and interpreted by Lyndal Pulley  Limited ultrasound shows patient is neuroma significantly improved from previous exam.  No significant hypoechoic changes or increasing Doppler flow noted.  Osteopathic findings  C2 flexed rotated and side bent right C6 flexed rotated and side bent left T9 extended rotated and side bent  left L2 flexed rotated and side bent right Sacrum right on right    Impression and Recommendations:     This case required medical decision making of moderate complexity. The above documentation has been reviewed and is accurate and complete Lyndal Pulley, DO       Note: This dictation was prepared with Dragon dictation along with smaller phrase technology. Any transcriptional errors that result from this process are unintentional.

## 2018-10-12 NOTE — Assessment & Plan Note (Signed)
Decision today to treat with OMT was based on Physical Exam  After verbal consent patient was treated with HVLA, ME, FPR techniques in cervical, thoracic, lumbar and sacral areas  Patient tolerated the procedure well with improvement in symptoms  Patient given exercises, stretches and lifestyle modifications  See medications in patient instructions if given  Patient will follow up in 4-8 weeks 

## 2018-10-12 NOTE — Assessment & Plan Note (Signed)
Discussed icing regimen and home exercise respond well to the injections.  Discussed posture and ergonomics.  Follow-up again in 4 to 8 weeks.

## 2018-10-12 NOTE — Patient Instructions (Signed)
Good to see you  B12 1035mcg daily  B6 100mg  daily  See me again in 4 weeks

## 2018-10-26 DIAGNOSIS — N951 Menopausal and female climacteric states: Secondary | ICD-10-CM | POA: Diagnosis not present

## 2018-10-26 DIAGNOSIS — R5383 Other fatigue: Secondary | ICD-10-CM | POA: Diagnosis not present

## 2018-10-26 DIAGNOSIS — F329 Major depressive disorder, single episode, unspecified: Secondary | ICD-10-CM | POA: Diagnosis not present

## 2018-10-26 DIAGNOSIS — R6882 Decreased libido: Secondary | ICD-10-CM | POA: Diagnosis not present

## 2018-11-18 NOTE — Progress Notes (Signed)
[name] [address], [place] [address] Phone: [phone] Subjective:   [name], am serving as a scribe for Dr. Hulan Saas.   CC: Back pain follow-up  [id number]  [name] is a 53 y.o. female coming in with complaint of neck and back pain. She said that her left hip is sore but isn't as bad as pervious visits. Has been exercising and doing well with exercise.  Has been doing well overall.  Is also having left hand 2nd finger pain over DIP. Caught her finger in the car door 2 months ago. Is not having pain but feels that there is a bony knot in that area.  Patient states that it is not usually painful.     Past Medical History:  Diagnosis Date  . Cancer ([facility]) 08/2006   neoplasm appendix-removed [name] chemo  . Family history of breast cancer   . Family history of prostate cancer   . Interstitial cystitis    mild  . Neoplasm of appendix   . Thyroid disease    Past Surgical History:  Procedure Laterality Date  . APPENDECTOMY  08/2006  . COLONOSCOPY  04/17/2007  . TUBAL LIGATION  11/1998  . UMBILICAL HERNIA REPAIR  10/2016   Social History   Socioeconomic History  . Marital status: Married    Spouse name: Not on file  . Number of children: Not on file  . Years of education: Not on file  . Highest education level: Not on file  Occupational History  . Not on file  Social Needs  . Financial resource strain: Not on file  . Food insecurity:    Worry: Not on file    Inability: Not on file  . Transportation needs:    Medical: Not on file    Non-medical: Not on file  Tobacco Use  . Smoking status: Never Smoker  . Smokeless tobacco: Never Used  Substance and Sexual Activity  . Alcohol use: Yes    Alcohol/week: 5.0 standard drinks    Types: 5 Glasses of wine per week    Comment: occasional  . Drug use: [name]  . Sexual activity: Not on file  Lifestyle  . Physical activity:    Days per week: Not on file   Minutes per session: Not on file  . Stress: Not on file  Relationships  . Social connections:    Talks on phone: Not on file    Gets together: Not on file    Attends religious service: Not on file    Active member of club or organization: Not on file    Attends meetings of clubs or organizations: Not on file    Relationship status: Not on file  Other Topics Concern  . Not on file  Social History Narrative  . Not on file   Allergies  Allergen Reactions  . Sulfur Hives  . Sulfa Antibiotics Hives and Rash    Reaction not listed   Family History  Problem Relation Age of Onset  . Breast cancer Mother 69       had hysterectomy at 61  . Skin cancer Brother        had a few removed over lifetime, one was on chest.  Is now 35  . Stomach cancer Maternal Grandmother   . Lung cancer Paternal Grandmother 51       dx. early 19's died 55  . Lung cancer Paternal Grandfather 36  dx in 8's, died at 95  . Prostate cancer Maternal Uncle 70       died shortly after in 60's.  Metastatic- spread to brain and many other locations  . Colon cancer Neg Hx   . Esophageal cancer Neg Hx   . Pancreatic cancer Neg Hx   . Rectal cancer Neg Hx     Current Outpatient Medications (Endocrine & Metabolic):  Marland Kitchen  ARMOUR THYROID PO, Take 60 mg by mouth 2 (two) times daily.  Marland Kitchen  liothyronine (CYTOMEL) 25 MCG tablet, Take 50 mcg by mouth 2 (two) times daily.  .  progesterone (PROMETRIUM) 200 MG capsule, Take 400 mg by mouth 2 (two) times daily.   Current Outpatient Medications (Respiratory):  .  levocetirizine (XYZAL) 5 MG tablet, Take 5 mg by mouth every evening.    Current Outpatient Medications (Hematological):  Marland Kitchen  Cyanocobalamin (VITAMIN B-12 PO), Take 1 mg by mouth daily. .  IRON PO, Take 29 mg by mouth daily.  Current Outpatient Medications (Other):  .  cholecalciferol (VITAMIN D) 400 units TABS tablet, Take 400 Units by mouth. .  cyclobenzaprine (FLEXERIL) 10 MG tablet, Take 1 tablet (10 mg  total) by mouth at bedtime. Marland Kitchen  EVENING PRIMROSE OIL PO, Take 1 tablet by mouth daily. Marland Kitchen  gabapentin (NEURONTIN) 100 MG capsule, Take 2 capsules (200 mg total) by mouth at bedtime. Marland Kitchen  MAGNESIUM CITRATE PO*, Take 1 tablet by mouth daily. .  Menaquinone-7 (VITAMIN K2 PO), Take 150 mcg by mouth daily. .  Multiple Vitamins-Minerals (MULTIVITAMIN ADULT PO), Take 1 tablet by mouth daily. .  Probiotic Product (PROBIOTIC PO), Take 1 tablet by mouth daily. .  Vitamin D, Ergocalciferol, (DRISDOL) 50000 units CAPS capsule, TAKE ONE CAPSULE WEEKLY * These medications belong to multiple therapeutic classes and are listed under each applicable group.    Past medical history, social, surgical and family history all reviewed in electronic medical record.  [name] pertanent information unless stated regarding to the chief complaint.   Review of Systems:  [name] headache, visual changes, nausea, vomiting, diarrhea, constipation, dizziness, abdominal pain, skin rash, fevers, chills, night sweats, weight loss, swollen lymph nodes, body aches, joint swelling, chest pain, shortness of breath, mood changes.  Positive Muscle aches  Objective  Blood pressure 104/78, pulse 97, height 5' 6.5" (1.689 m), SpO2 96 %.    General: [name] apparent distress alert and oriented x3 mood and affect normal, dressed appropriately.  HEENT: Pupils equal, extraocular movements intact  Respiratory: Patient's speak in full sentences and does not appear short of breath  Cardiovascular: [name] lower extremity edema, non tender, [name] erythema  Skin: Warm dry intact with [name] signs of infection or rash on extremities or on axial skeleton.  Abdomen: Soft nontender  Neuro: Cranial nerves II through XII are intact, neurovascularly intact in all extremities with 2+ DTRs and 2+ pulses.  Lymph: [name] lymphadenopathy of posterior or anterior cervical chain or axillae bilaterally.  Gait normal with good balance and coordination.  MSK:  Non tender with full range of  motion and good stability and symmetric strength and tone of shoulders, elbows, wrist, hip, knee and ankles bilaterally.  Back Exam:  Inspection: Loss of lordosis Motion: Flexion 40 deg, Extension 25 deg, Side Bending to 35 deg bilaterally, Rotation to 35 deg bilaterally  SLR laying: Negative  XSLR laying: Negative  Palpable tenderness: Discussed some mild tenderness in the paraspinal musculature lumbar spine. FABER: Positive Faber bilateral. Sensory change: Gross sensation intact to all lumbar and  sacral dermatomes.  Reflexes: 2+ at both patellar tendons, 2+ at achilles tendons, Babinski's downgoing.  Strength at foot  Plantar-flexion: 5/5 Dorsi-flexion: 5/5 Eversion: 5/5 Inversion: 5/5  Leg strength  Quad: 5/5 Hamstring: 5/5 Hip flexor: 5/5 Hip abductors: 5/5  Gait unremarkable.  Osteopathic findings C6 flexed rotated and side bent left T9 extended rotated and side bent left L2 flexed rotated and side bent right Sacrum right on right    Impression and Recommendations:     This case required medical decision making of moderate complexity. The above documentation has been reviewed and is accurate and complete Lyndal Pulley, DO       Note: This dictation was prepared with Dragon dictation along with smaller phrase technology. Any transcriptional errors that result from this process are unintentional.

## 2018-11-20 ENCOUNTER — Other Ambulatory Visit: Payer: Self-pay

## 2018-11-20 ENCOUNTER — Ambulatory Visit: Payer: BLUE CROSS/BLUE SHIELD | Admitting: Family Medicine

## 2018-11-20 ENCOUNTER — Encounter: Payer: Self-pay | Admitting: Family Medicine

## 2018-11-20 VITALS — BP 104/78 | HR 97 | Ht 66.5 in

## 2018-11-20 DIAGNOSIS — M545 Low back pain, unspecified: Secondary | ICD-10-CM

## 2018-11-20 DIAGNOSIS — G8929 Other chronic pain: Secondary | ICD-10-CM | POA: Diagnosis not present

## 2018-11-20 DIAGNOSIS — M999 Biomechanical lesion, unspecified: Secondary | ICD-10-CM

## 2018-11-20 NOTE — Assessment & Plan Note (Signed)
Stable.  Patient has been doing some working out on a more regular basis.  Discussed icing regimen and home exercise.  Responding well to manipulation.  Follow-up again in 8 weeks.

## 2018-11-20 NOTE — Assessment & Plan Note (Signed)
Decision today to treat with OMT was based on Physical Exam  After verbal consent patient was treated with HVLA, ME, FPR techniques in cervical, thoracic, lumbar and sacral areas  Patient tolerated the procedure well with improvement in symptoms  Patient given exercises, stretches and lifestyle modifications  See medications in patient instructions if given  Patient will follow up in 4-8 weeks 

## 2018-11-20 NOTE — Patient Instructions (Signed)
Good to see you  Ice is your friend Keep it up  Continue the B vitamins See me again in 2 months

## 2019-01-01 ENCOUNTER — Ambulatory Visit: Payer: BLUE CROSS/BLUE SHIELD | Admitting: Family Medicine

## 2019-01-01 ENCOUNTER — Encounter: Payer: Self-pay | Admitting: Family Medicine

## 2019-01-01 ENCOUNTER — Other Ambulatory Visit: Payer: Self-pay

## 2019-01-01 VITALS — BP 108/82 | HR 77 | Ht 66.5 in | Wt 173.0 lb

## 2019-01-01 DIAGNOSIS — M62838 Other muscle spasm: Secondary | ICD-10-CM

## 2019-01-01 DIAGNOSIS — M999 Biomechanical lesion, unspecified: Secondary | ICD-10-CM

## 2019-01-01 MED ORDER — KETOROLAC TROMETHAMINE 60 MG/2ML IM SOLN
60.0000 mg | Freq: Once | INTRAMUSCULAR | Status: AC
  Administered 2019-01-01: 16:00:00 60 mg via INTRAMUSCULAR

## 2019-01-01 MED ORDER — TIZANIDINE HCL 4 MG PO TABS: 4.0000 mg | ORAL_TABLET | Freq: Three times a day (TID) | ORAL | 2 refills | Status: AC | PRN

## 2019-01-01 MED ORDER — METHYLPREDNISOLONE ACETATE 80 MG/ML IJ SUSP
80.0000 mg | Freq: Once | INTRAMUSCULAR | Status: AC
  Administered 2019-01-01: 80 mg via INTRAMUSCULAR

## 2019-01-01 NOTE — Assessment & Plan Note (Signed)
Significant muscle tightness.  We discussed icing regimen of exercise.  We discussed which activities to do which was to avoid.  Patient is to increase activity as tolerated.  Follow-up again 4 to 8 weeks.

## 2019-01-01 NOTE — Assessment & Plan Note (Signed)
Decision today to treat with OMT was based on Physical Exam  After verbal consent patient was treated with HVLA, ME, FPR techniques in cervical, thoracic, lumbar and sacral areas  Patient tolerated the procedure well with improvement in symptoms  Patient given exercises, stretches and lifestyle modifications  See medications in patient instructions if given  Patient will follow up in 4-8 weeks 

## 2019-01-01 NOTE — Progress Notes (Signed)
[name] [place], [place] [address] Phone: [phone] Subjective:   [name], am serving as a scribe for Dr. Hulan Saas.    CC: Body aches especially back and neck pain  [id number]  [name] is a 53 y.o. female coming in with complaint of back pain. Is having a flare up for past 8 days. Had another a few weeks ago. Pain is occurring in the right side of her neck. Radiating into the right shoulder. Has been trying to use heat and hot baths. Is using muscle relaxers, IBU, topical analgesics. Patient has been working at a laptop at home. Is having a hard time sleeping.  Patient denies any fevers or chills.       Past Medical History:  Diagnosis Date  . 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 Family history of breast cancer   . Family history of prostate cancer   . Interstitial cystitis    mild  . Neoplasm of appendix   . Thyroid disease    Past Surgical History:  Procedure Laterality Date  . APPENDECTOMY  08/2006  . COLONOSCOPY  04/17/2007  . TUBAL LIGATION  11/1998  . UMBILICAL HERNIA REPAIR  10/2016   Social History   Socioeconomic History  . Marital status: Married    Spouse name: Not on file  . Number of children: Not on file  . Years of education: Not on file  . Highest education level: Not on file  Occupational History  . Not on file  Social Needs  . Financial resource strain: Not on file  . Food insecurity:    Worry: Not on file    Inability: Not on file  . Transportation needs:    Medical: Not on file    Non-medical: Not on file  Tobacco Use  . Smoking status: Never Smoker  . Smokeless tobacco: Never Used  Substance and Sexual Activity  . Alcohol use: Yes    Alcohol/week: 5.0 standard drinks    Types: 5 Glasses of wine per week    Comment: occasional  . Drug use: [name]  . Sexual activity: Not on file  Lifestyle  . Physical activity:    Days per week: Not on file     Minutes per session: Not on file  . Stress: Not on file  Relationships  . Social connections:    Talks on phone: Not on file    Gets together: Not on file    Attends religious service: Not on file    Active member of club or organization: Not on file    Attends meetings of clubs or organizations: Not on file    Relationship status: Not on file  Other Topics Concern  . Not on file  Social History Narrative  . Not on file   Allergies  Allergen Reactions  . Sulfur Hives  . Sulfa Antibiotics Hives and Rash    Reaction not listed   Family History  Problem Relation Age of Onset  . Breast cancer Mother 38       had hysterectomy at 42  . Skin cancer Brother        had a few removed over lifetime, one was on chest.  Is now 43  . Stomach cancer Maternal Grandmother   . Lung cancer Paternal Grandmother 50       dx. early 24's died 77  . Lung cancer Paternal Grandfather 31  dx in 21's, died at 15  . Prostate cancer Maternal Uncle 70       died shortly after in 70's.  Metastatic- spread to brain and many other locations  . Colon cancer Neg Hx   . Esophageal cancer Neg Hx   . Pancreatic cancer Neg Hx   . Rectal cancer Neg Hx     Current Outpatient Medications (Endocrine & Metabolic):  Marland Kitchen  ARMOUR THYROID PO, Take 60 mg by mouth 2 (two) times daily.  Marland Kitchen  liothyronine (CYTOMEL) 25 MCG tablet, Take 50 mcg by mouth 2 (two) times daily.  .  progesterone (PROMETRIUM) 200 MG capsule, Take 400 mg by mouth 2 (two) times daily.   Current Outpatient Medications (Respiratory):  .  levocetirizine (XYZAL) 5 MG tablet, Take 5 mg by mouth every evening.    Current Outpatient Medications (Hematological):  Marland Kitchen  Cyanocobalamin (VITAMIN B-12 PO), Take 1 mg by mouth daily. .  IRON PO, Take 29 mg by mouth daily.  Current Outpatient Medications (Other):  .  cholecalciferol (VITAMIN D) 400 units TABS tablet, Take 400 Units by mouth. .  cyclobenzaprine (FLEXERIL) 10 MG tablet, Take 1 tablet (10  mg total) by mouth at bedtime. Marland Kitchen  EVENING PRIMROSE OIL PO, Take 1 tablet by mouth daily. Marland Kitchen  gabapentin (NEURONTIN) 100 MG capsule, Take 2 capsules (200 mg total) by mouth at bedtime. Marland Kitchen  MAGNESIUM CITRATE PO*, Take 1 tablet by mouth daily. .  Menaquinone-7 (VITAMIN K2 PO), Take 150 mcg by mouth daily. .  Multiple Vitamins-Minerals (MULTIVITAMIN ADULT PO), Take 1 tablet by mouth daily. .  Probiotic Product (PROBIOTIC PO), Take 1 tablet by mouth daily. .  Vitamin D, Ergocalciferol, (DRISDOL) 50000 units CAPS capsule, TAKE ONE CAPSULE WEEKLY .  tiZANidine (ZANAFLEX) 4 MG tablet, Take 1 tablet (4 mg total) by mouth every 8 (eight) hours as needed for up to 10 days for muscle spasms. * These medications belong to multiple therapeutic classes and are listed under each applicable group.    Past medical history, social, surgical and family history all reviewed in electronic medical record.  [name] pertanent information unless stated regarding to the chief complaint.   Review of Systems:  [name] headache, visual changes, nausea, vomiting, diarrhea, constipation, dizziness, abdominal pain, skin rash, fevers, chills, night sweats, weight loss, swollen lymph nodes, body aches, joint swelling, , chest pain, shortness of breath, mood changes.  Positive muscle aches  Objective  Blood pressure 108/82, pulse 77, height 5' 6.5" (1.689 m), weight 173 lb (78.5 kg), SpO2 98 %.    General: [name] apparent distress alert and oriented x3 mood and affect normal, dressed appropriately.  HEENT: Pupils equal, extraocular movements intact  Respiratory: Patient's speak in full sentences and does not appear short of breath  Cardiovascular: [name] lower extremity edema, non tender, [name] erythema  Skin: Warm dry intact with [name] signs of infection or rash on extremities or on axial skeleton.  Abdomen: Soft nontender  Neuro: Cranial nerves II through XII are intact, neurovascularly intact in all extremities with 2+ DTRs and 2+ pulses.   Lymph: [name] lymphadenopathy of posterior or anterior cervical chain or axillae bilaterally.  Gait mild antalgic and cautious MSK:  tender with limited range of motion and good stability and symmetric strength and tone of shoulders, elbows, wrist, hip, knee and ankles bilaterally.  Patient neck exam has loss of lordosis.  Mild crepitus noted.  Negative Spurling's. Significant tightness noted in the para musculature around the scapular region bilaterally.  Significant  tightness with loss of lordosis of the lumbar spine as well.  Positive Faber test bilaterally.  Negative straight leg test.  Neurovascular intact with 5 out of 5 strength of all extremities.  Osteopathic findings C2 flexed rotated and side bent right T9 extended rotated and side bent left L4 flexed rotated and side bent left  Sacrum right on right    Impression and Recommendations:     This case required medical decision making of moderate complexity. The above documentation has been reviewed and is accurate and complete Lyndal Pulley, DO       Note: This dictation was prepared with Dragon dictation along with smaller phrase technology. Any transcriptional errors that result from this process are unintentional.

## 2019-01-01 NOTE — Patient Instructions (Addendum)
Good  to see you  [name] is your friend Stay active 2 injections today to hewlp  Refilled muscle relaxer Listen to your body  See me again as scheduled

## 2019-01-24 NOTE — Progress Notes (Signed)
[name] [place], [place] [address] Phone: [phone] Subjective:   [name], am serving as a scribe for Dr. Hulan Saas.   CC: Neck and back pain follow-up  [id number]    01/01/19: Significant muscle tightness.  We discussed icing regimen of exercise.  We discussed which activities to do which was to avoid.  Patient is to increase activity as tolerated.  Follow-up again 4 to 8 weeks.  Update 01/25/2019: [name] is a 53 y.o. female coming in with complaint of neck and shoulder pain.  Pt states that she has been having some spasms in her neck.  Did have injections at last visit. Overall she notes improvement in her symptoms. Does feel better. Has been using the muscle relaxer intermittently. Injection took a few days to improve her pain. Has been getting massages which have helped. Notices some twitching recently in muscles near base of skull.   Also having left foot pain over a neuroma for one week. Did a PRP injection but is feeling an increase in her pain. Pain on top of foot over 3rd and 4th toe. Noticed more pain last night when she went to bed.   Location- upper back and shoulders Character- aching/nagging pain Aggravating factors- Prolonged sitting and posture Therapies tried- massage; Flexeril and Gabapentin Severity- Mild-moderate but mostly mild      Past Medical History:  Diagnosis Date  . 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 Family history of breast cancer   . Family history of prostate cancer   . Interstitial cystitis    mild  . Neoplasm of appendix   . Thyroid disease    Past Surgical History:  Procedure Laterality Date  . APPENDECTOMY  08/2006  . COLONOSCOPY  04/17/2007  . TUBAL LIGATION  11/1998  . UMBILICAL HERNIA REPAIR  10/2016   Social History   Socioeconomic History  . Marital status: Married    Spouse name: Not on file  . Number of children: Not on file  .  Years of education: Not on file  . Highest education level: Not on file  Occupational History  . Not on file  Social Needs  . Financial resource strain: Not on file  . Food insecurity:    Worry: Not on file    Inability: Not on file  . Transportation needs:    Medical: Not on file    Non-medical: Not on file  Tobacco Use  . Smoking status: Never Smoker  . Smokeless tobacco: Never Used  Substance and Sexual Activity  . Alcohol use: Yes    Alcohol/week: 5.0 standard drinks    Types: 5 Glasses of wine per week    Comment: occasional  . Drug use: [name]  . Sexual activity: Not on file  Lifestyle  . Physical activity:    Days per week: Not on file    Minutes per session: Not on file  . Stress: Not on file  Relationships  . Social connections:    Talks on phone: Not on file    Gets together: Not on file    Attends religious service: Not on file    Active member of club or organization: Not on file    Attends meetings of clubs or organizations: Not on file    Relationship status: Not on file  Other Topics Concern  . Not on file  Social History Narrative  . Not on file   Allergies  Allergen Reactions  . Sulfur Hives  . Sulfa Antibiotics Hives and Rash    Reaction not listed   Family History  Problem Relation Age of Onset  . Breast cancer Mother 87       had hysterectomy at 72  . Skin cancer Brother        had a few removed over lifetime, one was on chest.  Is now 70  . Stomach cancer Maternal Grandmother   . Lung cancer Paternal Grandmother 44       dx. early 33's died 36  . Lung cancer Paternal Grandfather 57       dx in 33's, died at 71  . Prostate cancer Maternal Uncle 70       died shortly after in 27's.  Metastatic- spread to brain and many other locations  . Colon cancer Neg Hx   . Esophageal cancer Neg Hx   . Pancreatic cancer Neg Hx   . Rectal cancer Neg Hx     Current Outpatient Medications (Endocrine & Metabolic):  Marland Kitchen  ARMOUR THYROID PO, Take 60 mg by  mouth 2 (two) times daily.  Marland Kitchen  liothyronine (CYTOMEL) 25 MCG tablet, Take 50 mcg by mouth 2 (two) times daily.  .  progesterone (PROMETRIUM) 200 MG capsule, Take 400 mg by mouth 2 (two) times daily.   Current Outpatient Medications (Respiratory):  .  levocetirizine (XYZAL) 5 MG tablet, Take 5 mg by mouth every evening.    Current Outpatient Medications (Hematological):  Marland Kitchen  Cyanocobalamin (VITAMIN B-12 PO), Take 1 mg by mouth daily. .  IRON PO, Take 29 mg by mouth daily.  Current Outpatient Medications (Other):  .  cholecalciferol (VITAMIN D) 400 units TABS tablet, Take 400 Units by mouth. .  cyclobenzaprine (FLEXERIL) 10 MG tablet, Take 1 tablet (10 mg total) by mouth at bedtime. Marland Kitchen  EVENING PRIMROSE OIL PO, Take 1 tablet by mouth daily. Marland Kitchen  gabapentin (NEURONTIN) 100 MG capsule, Take 2 capsules (200 mg total) by mouth at bedtime. Marland Kitchen  MAGNESIUM CITRATE PO*, Take 1 tablet by mouth daily. .  Menaquinone-7 (VITAMIN K2 PO), Take 150 mcg by mouth daily. .  Multiple Vitamins-Minerals (MULTIVITAMIN ADULT PO), Take 1 tablet by mouth daily. .  Probiotic Product (PROBIOTIC PO), Take 1 tablet by mouth daily. .  Vitamin D, Ergocalciferol, (DRISDOL) 50000 units CAPS capsule, TAKE ONE CAPSULE WEEKLY * These medications belong to multiple therapeutic classes and are listed under each applicable group.    Past medical history, social, surgical and family history all reviewed in electronic medical record.  [name] pertanent information unless stated regarding to the chief complaint.   Review of Systems:  [name] headache, visual changes, nausea, vomiting, diarrhea, constipation, dizziness, abdominal pain, skin rash, fevers, chills, night sweats, weight loss, swollen lymph nodes, body aches, joint swelling, muscle aches, chest pain, shortness of breath, mood changes.  Positive muscle aches  Objective  Blood pressure 110/74, pulse (!) 56, height 5' 6.5" (1.689 m), weight 163 lb (73.9 kg), SpO2 97 %. Systems  examined below as of    General: [name] apparent distress alert and oriented x3 mood and affect normal, dressed appropriately.  HEENT: Pupils equal, extraocular movements intact  Respiratory: Patient's speak in full sentences and does not appear short of breath  Cardiovascular: [name] lower extremity edema, non tender, [name] erythema  Skin: Warm dry intact with [name] signs of infection or rash on extremities or on axial skeleton.  Abdomen: Soft nontender  Neuro: Cranial nerves II through  XII are intact, neurovascularly intact in all extremities with 2+ DTRs and 2+ pulses.  Lymph: [name] lymphadenopathy of posterior or anterior cervical chain or axillae bilaterally.  Gait normal with good balance and coordination.  MSK:  Non tender with full range of motion and good stability and symmetric strength and tone of shoulders, elbows, wrist, hip, knee and ankles bilaterally.  Foot exam still shows the patient does have severe narrowing of the third.  Positive squeeze test on the left foot.  Patient does not have any swelling.  [name] numbness or tingling.  Full range of motion of the ankle.  Back exam this is some loss of lordosis.  Poor core strength.  Decreased range of motion in all planes. Neg slt positiv e faber Neck does have some muscle tightness.  Negative Spurling's.  Near full range of motion but some decrease in sidebending bilaterally  Osteopathic findings C2 flexed rotated and side bent right C4 flexed rotated and side bent left C6 flexed rotated and side bent left T3 extended rotated and side bent right inhaled third rib T9 extended rotated and side bent left L2 flexed rotated and side bent right Sacrum right on right     Impression and Recommendations:     This case required medical decision making of moderate complexity. The above documentation has been reviewed and is accurate and complete Mary Pulley, DO       Note: This dictation was prepared with Dragon dictation along with smaller  phrase technology. Any transcriptional errors that result from this process are unintentional.

## 2019-01-25 ENCOUNTER — Ambulatory Visit (INDEPENDENT_AMBULATORY_CARE_PROVIDER_SITE_OTHER): Payer: BLUE CROSS/BLUE SHIELD | Admitting: Family Medicine

## 2019-01-25 ENCOUNTER — Encounter: Payer: Self-pay | Admitting: Family Medicine

## 2019-01-25 ENCOUNTER — Other Ambulatory Visit: Payer: Self-pay

## 2019-01-25 VITALS — BP 110/74 | HR 56 | Ht 66.5 in | Wt 163.0 lb

## 2019-01-25 DIAGNOSIS — M62838 Other muscle spasm: Secondary | ICD-10-CM

## 2019-01-25 DIAGNOSIS — M999 Biomechanical lesion, unspecified: Secondary | ICD-10-CM

## 2019-01-25 NOTE — Patient Instructions (Signed)
Great to see you  [name] is yoru friend Try not lacing the last eye on the shoe and try to not load the front f the foot on the elliptical  Enjoy the beach  See me again in 6 weeks

## 2019-01-25 NOTE — Assessment & Plan Note (Signed)
Continues to have some upper neck pain and back pain.  Has responded well to manipulation.  Discussed icing regimen and home exercise.  Patient still needs to work on core strengthening.  Discussed hip abductor strengthening.  Follow-up again 4 to 8 weeks

## 2019-01-25 NOTE — Assessment & Plan Note (Signed)
Decision today to treat with OMT was based on Physical Exam  After verbal consent patient was treated with HVLA, ME, FPR techniques in cervical, thoracic, lumbar and sacral areas  Patient tolerated the procedure well with improvement in symptoms  Patient given exercises, stretches and lifestyle modifications  See medications in patient instructions if given  Patient will follow up in 4-8 weeks 

## 2019-02-20 DIAGNOSIS — Z01419 Encounter for gynecological examination (general) (routine) without abnormal findings: Secondary | ICD-10-CM | POA: Diagnosis not present

## 2019-02-20 DIAGNOSIS — Z6828 Body mass index (BMI) 28.0-28.9, adult: Secondary | ICD-10-CM | POA: Diagnosis not present

## 2019-03-14 ENCOUNTER — Ambulatory Visit: Payer: BLUE CROSS/BLUE SHIELD | Admitting: Family Medicine

## 2019-03-20 ENCOUNTER — Other Ambulatory Visit: Payer: Self-pay | Admitting: Obstetrics and Gynecology

## 2019-03-20 DIAGNOSIS — Z1231 Encounter for screening mammogram for malignant neoplasm of breast: Secondary | ICD-10-CM

## 2019-03-20 DIAGNOSIS — Z13820 Encounter for screening for osteoporosis: Secondary | ICD-10-CM | POA: Diagnosis not present

## 2019-03-28 ENCOUNTER — Ambulatory Visit: Payer: BLUE CROSS/BLUE SHIELD | Admitting: Family Medicine

## 2019-03-28 ENCOUNTER — Encounter: Payer: Self-pay | Admitting: Family Medicine

## 2019-03-28 ENCOUNTER — Other Ambulatory Visit: Payer: Self-pay

## 2019-03-28 VITALS — BP 122/80 | HR 86 | Ht 66.5 in | Wt 178.0 lb

## 2019-03-28 DIAGNOSIS — M62838 Other muscle spasm: Secondary | ICD-10-CM | POA: Diagnosis not present

## 2019-03-28 DIAGNOSIS — M999 Biomechanical lesion, unspecified: Secondary | ICD-10-CM

## 2019-03-28 NOTE — Progress Notes (Signed)
[name] [place], [place] [address] Phone: [phone] Subjective:   I [name] am serving as a Education administrator for Dr. Hulan Saas.  I'm seeing this patient by the request  of:    CC: Neck and back pain follow-up  [id number]  [name] is a 53 y.o. female coming in with complaint of neck and back pain. Had a bone scan done recently. States she is dong much better and has been exercising.  Patient states some mild pain on the lateral aspect of the hip but nothing severe.  Patient denies any significant back pain at the moment just tightness.     Past Medical History:  Diagnosis Date  . 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 Family history of breast cancer   . Family history of prostate cancer   . Interstitial cystitis    mild  . Neoplasm of appendix   . Thyroid disease    Past Surgical History:  Procedure Laterality Date  . APPENDECTOMY  08/2006  . COLONOSCOPY  04/17/2007  . TUBAL LIGATION  11/1998  . UMBILICAL HERNIA REPAIR  10/2016   Social History   Socioeconomic History  . Marital status: Married    Spouse name: Not on file  . Number of children: Not on file  . Years of education: Not on file  . Highest education level: Not on file  Occupational History  . Not on file  Social Needs  . Financial resource strain: Not on file  . Food insecurity    Worry: Not on file    Inability: Not on file  . Transportation needs    Medical: Not on file    Non-medical: Not on file  Tobacco Use  . Smoking status: Never Smoker  . Smokeless tobacco: Never Used  Substance and Sexual Activity  . Alcohol use: Yes    Alcohol/week: 5.0 standard drinks    Types: 5 Glasses of wine per week    Comment: occasional  . Drug use: No  . Sexual activity: Not on file  Lifestyle  . Physical activity    Days per week: Not on file    Minutes per session: Not on file  . Stress: Not on file  Relationships  .  Social Herbalist on phone: Not on file    Gets together: Not on file    Attends religious service: Not on file    Active member of club or organization: Not on file    Attends meetings of clubs or organizations: Not on file    Relationship status: Not on file  Other Topics Concern  . Not on file  Social History Narrative  . Not on file   Allergies  Allergen Reactions  . Sulfur Hives  . Sulfa Antibiotics Hives and Rash    Reaction not listed   Family History  Problem Relation Age of Onset  . Breast cancer Mother 31       had hysterectomy at 61  . Skin cancer Brother        had a few removed over lifetime, one was on chest.  Is now 43  . Stomach cancer Maternal Grandmother   . Lung cancer Paternal Grandmother 54       dx. early 38's died 76  . Lung cancer Paternal Grandfather 62       dx in 64's, died at 17  . Prostate cancer Maternal Uncle 58  died shortly after in 22's.  Metastatic- spread to brain and many other locations  . Colon cancer Neg Hx   . Esophageal cancer Neg Hx   . Pancreatic cancer Neg Hx   . Rectal cancer Neg Hx     Current Outpatient Medications (Endocrine & Metabolic):  Marland Kitchen  ARMOUR THYROID PO, Take 60 mg by mouth 2 (two) times daily.  Marland Kitchen  liothyronine (CYTOMEL) 25 MCG tablet, Take 50 mcg by mouth 2 (two) times daily.  .  progesterone (PROMETRIUM) 200 MG capsule, Take 400 mg by mouth 2 (two) times daily.   Current Outpatient Medications (Respiratory):  .  levocetirizine (XYZAL) 5 MG tablet, Take 5 mg by mouth every evening.    Current Outpatient Medications (Hematological):  Marland Kitchen  Cyanocobalamin (VITAMIN B-12 PO), Take 1 mg by mouth daily. .  IRON PO, Take 29 mg by mouth daily.  Current Outpatient Medications (Other):  .  cholecalciferol (VITAMIN D) 400 units TABS tablet, Take 400 Units by mouth. .  cyclobenzaprine (FLEXERIL) 10 MG tablet, Take 1 tablet (10 mg total) by mouth at bedtime. Marland Kitchen  EVENING PRIMROSE OIL PO, Take 1 tablet by mouth  daily. Marland Kitchen  gabapentin (NEURONTIN) 100 MG capsule, Take 2 capsules (200 mg total) by mouth at bedtime. Marland Kitchen  MAGNESIUM CITRATE PO*, Take 1 tablet by mouth daily. .  Menaquinone-7 (VITAMIN K2 PO), Take 150 mcg by mouth daily. .  Multiple Vitamins-Minerals (MULTIVITAMIN ADULT PO), Take 1 tablet by mouth daily. .  Probiotic Product (PROBIOTIC PO), Take 1 tablet by mouth daily. .  Vitamin D, Ergocalciferol, (DRISDOL) 50000 units CAPS capsule, TAKE ONE CAPSULE WEEKLY * These medications belong to multiple therapeutic classes and are listed under each applicable group.    Past medical history, social, surgical and family history all reviewed in electronic medical record.  No pertanent information unless stated regarding to the chief complaint.   Review of Systems:  No headache, visual changes, nausea, vomiting, diarrhea, constipation, dizziness, abdominal pain, skin rash, fevers, chills, night sweats, weight loss, swollen lymph nodes, body aches, joint swelling, muscle aches, chest pain, shortness of breath, mood changes.   Objective  Blood pressure 122/80, pulse 86, height 5' 6.5" (1.689 m), weight 178 lb (80.7 kg), SpO2 97 %. Systems examined below as of    General: No apparent distress alert and oriented x3 mood and affect normal, dressed appropriately.  HEENT: Pupils equal, extraocular movements intact  Respiratory: Patient's speak in full sentences and does not appear short of breath  Cardiovascular: No lower extremity edema, non tender, no erythema  Skin: Warm dry intact with no signs of infection or rash on extremities or on axial skeleton.  Abdomen: Soft nontender  Neuro: Cranial nerves II through XII are intact, neurovascularly intact in all extremities with 2+ DTRs and 2+ pulses.  Lymph: No lymphadenopathy of posterior or anterior cervical chain or axillae bilaterally.  Gait normal with good balance and coordination.  MSK:  Non tender with full range of motion and good stability and  symmetric strength and tone of shoulders, elbows, wrist, hip, knee and ankles bilaterally.  Neck: Inspection unremarkable. No palpable stepoffs. Negative Spurling's maneuver. Mild limited range of motion in all planes of 2 to 5 degrees Grip strength and sensation normal in bilateral hands Strength good C4 to T1 distribution No sensory change to C4 to T1 Negative Hoffman sign bilaterally Reflexes normal Mild tightness of the trapezius  Back Exam:  Inspection: Unremarkable  Motion: Flexion 45 deg, Extension 25 deg, Side  Bending to 35 deg bilaterally,  Rotation to 45 deg bilaterally  SLR laying: Negative  XSLR laying: Negative  Palpable tenderness: Tender to palpation the paraspinal musculature lumbar spine right greater than left. FABER:  tightness on the left  Sensory change: Gross sensation intact to all lumbar and sacral dermatomes.  Reflexes: 2+ at both patellar tendons, 2+ at achilles tendons, Babinski's downgoing.  Strength at foot  Plantar-flexion: 5/5 Dorsi-flexion: 5/5 Eversion: 5/5 Inversion: 5/5  Leg strength  Quad: 5/5 Hamstring: 5/5 Hip flexor: 5/5 Hip abductors: 5/5  Gait unremarkable.  Osteopathic findings C2 flexed rotated and side bent right C6 flexed rotated and side bent left T9 extended rotated and side bent left with inhaled rib  L2 flexed rotated and side bent right Sacrum left on left    Impression and Recommendations:     This case required medical decision making of moderate complexity. The above documentation has been reviewed and is accurate and complete Lyndal Pulley, DO       Note: This dictation was prepared with Dragon dictation along with smaller phrase technology. Any transcriptional errors that result from this process are unintentional.

## 2019-03-28 NOTE — Assessment & Plan Note (Signed)
Continues to have tightness.  Discussed with patient about posture and ergonomics.  Discussed which activities to do movements avoid.  Patient is interested to be slowly over the course of next several days.  Follow-up with me in 4 to 8 weeks

## 2019-03-28 NOTE — Patient Instructions (Signed)
Voltaren gel over the counter 2 times a day  See me again 6 weeks

## 2019-03-28 NOTE — Assessment & Plan Note (Signed)
Decision today to treat with OMT was based on Physical Exam  After verbal consent patient was treated with HVLA, ME, FPR techniques in cervical, thoracic, rib lumbar and sacral areas  Patient tolerated the procedure well with improvement in symptoms  Patient given exercises, stretches and lifestyle modifications  See medications in patient instructions if given  Patient will follow up in 4-8 weeks 

## 2019-04-05 DIAGNOSIS — E663 Overweight: Secondary | ICD-10-CM | POA: Diagnosis not present

## 2019-04-05 DIAGNOSIS — N950 Postmenopausal bleeding: Secondary | ICD-10-CM | POA: Diagnosis not present

## 2019-04-05 DIAGNOSIS — N951 Menopausal and female climacteric states: Secondary | ICD-10-CM | POA: Diagnosis not present

## 2019-04-05 DIAGNOSIS — R5383 Other fatigue: Secondary | ICD-10-CM | POA: Diagnosis not present

## 2019-04-11 DIAGNOSIS — N951 Menopausal and female climacteric states: Secondary | ICD-10-CM | POA: Diagnosis not present

## 2019-04-11 DIAGNOSIS — R6882 Decreased libido: Secondary | ICD-10-CM | POA: Diagnosis not present

## 2019-04-11 DIAGNOSIS — F329 Major depressive disorder, single episode, unspecified: Secondary | ICD-10-CM | POA: Diagnosis not present

## 2019-04-11 DIAGNOSIS — R5383 Other fatigue: Secondary | ICD-10-CM | POA: Diagnosis not present

## 2019-04-30 DIAGNOSIS — M542 Cervicalgia: Secondary | ICD-10-CM | POA: Diagnosis not present

## 2019-04-30 DIAGNOSIS — R293 Abnormal posture: Secondary | ICD-10-CM | POA: Diagnosis not present

## 2019-04-30 DIAGNOSIS — M2560 Stiffness of unspecified joint, not elsewhere classified: Secondary | ICD-10-CM | POA: Diagnosis not present

## 2019-04-30 DIAGNOSIS — M9901 Segmental and somatic dysfunction of cervical region: Secondary | ICD-10-CM | POA: Diagnosis not present

## 2019-05-07 ENCOUNTER — Ambulatory Visit
Admission: RE | Admit: 2019-05-07 | Discharge: 2019-05-07 | Disposition: A | Discharge status: Home / Self Care | Payer: BC Managed Care – PPO | Source: Ambulatory Visit | Attending: Obstetrics and Gynecology | Admitting: Obstetrics and Gynecology

## 2019-05-07 ENCOUNTER — Other Ambulatory Visit: Payer: Self-pay

## 2019-05-07 DIAGNOSIS — R293 Abnormal posture: Secondary | ICD-10-CM | POA: Diagnosis not present

## 2019-05-07 DIAGNOSIS — M9901 Segmental and somatic dysfunction of cervical region: Secondary | ICD-10-CM | POA: Diagnosis not present

## 2019-05-07 DIAGNOSIS — M2560 Stiffness of unspecified joint, not elsewhere classified: Secondary | ICD-10-CM | POA: Diagnosis not present

## 2019-05-07 DIAGNOSIS — Z1231 Encounter for screening mammogram for malignant neoplasm of breast: Secondary | ICD-10-CM

## 2019-05-07 DIAGNOSIS — M542 Cervicalgia: Secondary | ICD-10-CM | POA: Diagnosis not present

## 2019-05-09 ENCOUNTER — Ambulatory Visit: Payer: BC Managed Care – PPO | Admitting: Family Medicine

## 2019-05-16 ENCOUNTER — Other Ambulatory Visit: Payer: Self-pay | Admitting: Obstetrics and Gynecology

## 2019-05-16 DIAGNOSIS — Z803 Family history of malignant neoplasm of breast: Secondary | ICD-10-CM

## 2019-08-27 ENCOUNTER — Other Ambulatory Visit: Payer: Self-pay | Admitting: Obstetrics and Gynecology

## 2019-09-03 ENCOUNTER — Other Ambulatory Visit: Payer: Self-pay

## 2019-09-03 ENCOUNTER — Ambulatory Visit
Admission: RE | Admit: 2019-09-03 | Discharge: 2019-09-03 | Disposition: A | Discharge status: Home / Self Care | Payer: BC Managed Care – PPO | Source: Ambulatory Visit | Attending: Obstetrics and Gynecology | Admitting: Obstetrics and Gynecology

## 2019-09-03 DIAGNOSIS — Z803 Family history of malignant neoplasm of breast: Secondary | ICD-10-CM

## 2019-09-03 MED ORDER — GADOBUTROL 1 MMOL/ML IV SOLN
8.0000 mL | Freq: Once | INTRAVENOUS | Status: AC | PRN
  Administered 2019-09-03: 8 mL via INTRAVENOUS

## 2019-10-15 NOTE — Progress Notes (Signed)
[place] Phone: [phone] Subjective:   [name], am serving as a scribe for Dr. Hulan Saas. This visit occurred during the SARS-CoV-2 public health emergency.  Safety protocols were in place, including screening questions prior to the visit, additional usage of staff PPE, and extensive cleaning of exam room while observing appropriate contact time as indicated for disinfecting solutions.   I'm seeing this patient by the request  of:  Dian Queen, MD  CC: Foot pain   [id number]  [name] is a 54 y.o. female coming in with complaint of left foot pain. Is here today for PRP injection in neuroma. Patient started to notice increase in pain in December 2020. Pain at night and mornings her foot is numb. Pain is not as bad as it was before doing PRP last year. Is trying to exercise more on the elliptical, 5x a week for 30 minute. Walking doesn't bother her as much.    Extremity pes planus and neuroma   Past Medical History:  Diagnosis Date  . 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 Family history of breast cancer   . Family history of prostate cancer   . Interstitial cystitis    mild  . Neoplasm of appendix   . Thyroid disease    Past Surgical History:  Procedure Laterality Date  . APPENDECTOMY  08/2006  . BREAST BIOPSY Right   . COLONOSCOPY  04/17/2007  . TUBAL LIGATION  11/1998  . UMBILICAL HERNIA REPAIR  10/2016   Social History   Socioeconomic History  . Marital status: Married    Spouse name: Not on file  . Number of children: Not on file  . Years of education: Not on file  . Highest education level: Not on file  Occupational History  . Not on file  Tobacco Use  . Smoking status: Never Smoker  . Smokeless tobacco: Never Used  Substance and Sexual Activity  . Alcohol use: Yes    Alcohol/week: 5.0 standard drinks    Types: 5 Glasses of wine per week      Comment: occasional  . Drug use: No  . Sexual activity: Not on file  Other Topics Concern  . Not on file  Social History Narrative  . Not on file   Social Determinants of Health   Financial Resource Strain:   . Difficulty of Paying Living Expenses: Not on file  Food Insecurity:   . Worried About Charity fundraiser in the Last Year: Not on file  . Ran Out of Food in the Last Year: Not on file  Transportation Needs:   . Lack of Transportation (Medical): Not on file  . Lack of Transportation (Non-Medical): Not on file  Physical Activity:   . Days of Exercise per Week: Not on file  . Minutes of Exercise per Session: Not on file  Stress:   . Feeling of Stress : Not on file  Social Connections:   . Frequency of Communication with Friends and Family: Not on file  . Frequency of Social Gatherings with Friends and Family: Not on file  . Attends Religious Services: Not on file  . Active Member of Clubs or Organizations: Not on file  . Attends Archivist Meetings: Not on file  . Marital Status: Not on file   Allergies  Allergen Reactions  . Sulfur Hives  . Sulfa Antibiotics Hives and Rash  Reaction not listed   Family History  Problem Relation Age of Onset  . Breast cancer Mother 27       had hysterectomy at 67  . Skin cancer Brother        had a few removed over lifetime, one was on chest.  Is now 13  . Stomach cancer Maternal Grandmother   . Lung cancer Paternal Grandmother 68       dx. early 24's died 28  . Lung cancer Paternal Grandfather 68       dx in 30's, died at 24  . Prostate cancer Maternal Uncle 70       died shortly after in 23's.  Metastatic- spread to brain and many other locations  . Colon cancer Neg Hx   . Esophageal cancer Neg Hx   . Pancreatic cancer Neg Hx   . Rectal cancer Neg Hx     Current Outpatient Medications (Endocrine & Metabolic):  Marland Kitchen  ARMOUR THYROID PO, Take 60 mg by mouth 2 (two) times daily.  Marland Kitchen  liothyronine (CYTOMEL) 25  MCG tablet, Take 50 mcg by mouth 2 (two) times daily.  .  progesterone (PROMETRIUM) 200 MG capsule, Take 400 mg by mouth 2 (two) times daily.   Current Outpatient Medications (Respiratory):  .  levocetirizine (XYZAL) 5 MG tablet, Take 5 mg by mouth every evening.    Current Outpatient Medications (Hematological):  Marland Kitchen  Cyanocobalamin (VITAMIN B-12 PO), Take 1 mg by mouth daily. .  IRON PO, Take 29 mg by mouth daily.  Current Outpatient Medications (Other):  .  cholecalciferol (VITAMIN D) 400 units TABS tablet, Take 400 Units by mouth. .  cyclobenzaprine (FLEXERIL) 10 MG tablet, Take 1 tablet (10 mg total) by mouth at bedtime. Marland Kitchen  EVENING PRIMROSE OIL PO, Take 1 tablet by mouth daily. Marland Kitchen  gabapentin (NEURONTIN) 100 MG capsule, Take 2 capsules (200 mg total) by mouth at bedtime. Marland Kitchen  MAGNESIUM CITRATE PO*, Take 1 tablet by mouth daily. .  Menaquinone-7 (VITAMIN K2 PO), Take 150 mcg by mouth daily. .  Multiple Vitamins-Minerals (MULTIVITAMIN ADULT PO), Take 1 tablet by mouth daily. .  Probiotic Product (PROBIOTIC PO), Take 1 tablet by mouth daily. .  Vitamin D, Ergocalciferol, (DRISDOL) 50000 units CAPS capsule, TAKE ONE CAPSULE WEEKLY * These medications belong to multiple therapeutic classes and are listed under each applicable group.   Reviewed prior external information including notes and imaging from  primary care provider As well as notes that were available from care everywhere and other healthcare systems.  Past medical history, social, surgical and family history all reviewed in electronic medical record.  No pertanent information unless stated regarding to the chief complaint.   Review of Systems:  No headache, visual changes, nausea, vomiting, diarrhea, constipation, dizziness, abdominal pain, skin rash, fevers, chills, night sweats, weight loss, swollen lymph nodes, body aches, joint swelling, chest pain, shortness of breath, mood changes. POSITIVE muscle aches  Objective    Blood pressure 110/82, pulse 78, height 5\' 6"  (1.676 m), weight 183 lb (83 kg), SpO2 99 %.   General: No apparent distress alert and oriented x3 mood and affect normal, dressed appropriately.  HEENT: Pupils equal, extraocular movements intact  Respiratory: Patient's speak in full sentences and does not appear short of breath  Cardiovascular: No lower extremity edema, non tender, no erythema  Skin: Warm dry intact with no signs of infection or rash on extremities or on axial skeleton.  Abdomen: Soft nontender  Neuro: Cranial nerves  II through XII are intact, neurovascularly intact in all extremities with 2+ DTRs and 2+ pulses.  Lymph: No lymphadenopathy of posterior or anterior cervical chain or axillae bilaterally.  Gait normal with good balance and coordination.  MSK: Left foot exam shows that patient does have severe pes planus.  Narrow foot noted.  Positive squeeze test.  Procedure: Real-time Ultrasound Guided Injection of left foot neuroma Device: GE Logiq Q7 Ultrasound guided injection is preferred based studies that show increased duration, increased effect, greater accuracy, decreased procedural pain, increased response rate, and decreased cost with ultrasound guided versus blind injection.  Verbal informed consent obtained.  Time-out conducted.  Noted no overlying erythema, induration, or other signs of local infection.  Skin prepped in a sterile fashion.  Local anesthesia: Topical Ethyl chloride.  With sterile technique and under real time ultrasound guidance: With a 25-gauge half inch needle injected with 0.5 cc of 0.5% Marcaine and then injected with 4 cc of present diffuse PRP Completed without difficulty  Pain immediately resolved suggesting accurate placement of the medication.  Advised to call if fevers/chills, erythema, induration, drainage, or persistent bleeding.  Images permanently stored and available for review in the ultrasound unit.  Impression: Technically  successful ultrasound guided injection.   Impression and Recommendations:     This case required medical decision making of moderate complexity. The above documentation has been reviewed and is accurate and complete Lyndal Pulley, DO       Note: This dictation was prepared with Dragon dictation along with smaller phrase technology. Any transcriptional errors that result from this process are unintentional.

## 2019-10-16 ENCOUNTER — Ambulatory Visit (INDEPENDENT_AMBULATORY_CARE_PROVIDER_SITE_OTHER): Payer: BC Managed Care – PPO

## 2019-10-16 ENCOUNTER — Encounter: Payer: Self-pay | Admitting: Family Medicine

## 2019-10-16 ENCOUNTER — Ambulatory Visit (INDEPENDENT_AMBULATORY_CARE_PROVIDER_SITE_OTHER): Payer: Self-pay | Admitting: Family Medicine

## 2019-10-16 ENCOUNTER — Other Ambulatory Visit: Payer: Self-pay

## 2019-10-16 VITALS — BP 110/82 | HR 78 | Ht 66.0 in | Wt 183.0 lb

## 2019-10-16 DIAGNOSIS — G5762 Lesion of plantar nerve, left lower limb: Secondary | ICD-10-CM

## 2019-10-16 DIAGNOSIS — M79672 Pain in left foot: Secondary | ICD-10-CM | POA: Diagnosis not present

## 2019-10-16 NOTE — Patient Instructions (Signed)
No ice or Ibu for 3 days See me again in 3 weeks

## 2019-10-16 NOTE — Assessment & Plan Note (Signed)
Patient responded well to the injection.  Discussed icing regimen and home exercise, which activities to do which wants to avoid.  Patient knows about the icing protocol versus the heat.  Follow-up again in 3 to 6 weeks

## 2019-11-13 ENCOUNTER — Encounter: Payer: Self-pay | Admitting: Family Medicine

## 2019-11-13 ENCOUNTER — Other Ambulatory Visit: Payer: Self-pay

## 2019-11-13 ENCOUNTER — Ambulatory Visit (INDEPENDENT_AMBULATORY_CARE_PROVIDER_SITE_OTHER): Payer: BC Managed Care – PPO | Admitting: Family Medicine

## 2019-11-13 ENCOUNTER — Ambulatory Visit (INDEPENDENT_AMBULATORY_CARE_PROVIDER_SITE_OTHER): Payer: BC Managed Care – PPO

## 2019-11-13 VITALS — BP 124/74 | HR 73 | Ht 66.0 in | Wt 179.0 lb

## 2019-11-13 DIAGNOSIS — M25561 Pain in right knee: Secondary | ICD-10-CM

## 2019-11-13 DIAGNOSIS — M999 Biomechanical lesion, unspecified: Secondary | ICD-10-CM

## 2019-11-13 DIAGNOSIS — G8929 Other chronic pain: Secondary | ICD-10-CM | POA: Diagnosis not present

## 2019-11-13 DIAGNOSIS — M2550 Pain in unspecified joint: Secondary | ICD-10-CM

## 2019-11-13 DIAGNOSIS — M25562 Pain in left knee: Secondary | ICD-10-CM

## 2019-11-13 DIAGNOSIS — G5762 Lesion of plantar nerve, left lower limb: Secondary | ICD-10-CM

## 2019-11-13 DIAGNOSIS — M62838 Other muscle spasm: Secondary | ICD-10-CM

## 2019-11-13 LAB — CBC WITH DIFFERENTIAL/PLATELET
Basophils Absolute: 0 10*3/uL (ref 0.0–0.1)
Basophils Relative: 0.7 % (ref 0.0–3.0)
Eosinophils Absolute: 0.1 10*3/uL (ref 0.0–0.7)
Eosinophils Relative: 1.3 % (ref 0.0–5.0)
HCT: 42.8 % (ref 36.0–46.0)
Hemoglobin: 14.8 g/dL (ref 12.0–15.0)
Lymphocytes Relative: 36.2 % (ref 12.0–46.0)
Lymphs Abs: 2.6 10*3/uL (ref 0.7–4.0)
MCHC: 34.6 g/dL (ref 30.0–36.0)
MCV: 91.6 fl (ref 78.0–100.0)
Monocytes Absolute: 0.6 10*3/uL (ref 0.1–1.0)
Monocytes Relative: 7.7 % (ref 3.0–12.0)
Neutro Abs: 3.9 10*3/uL (ref 1.4–7.7)
Neutrophils Relative %: 54.1 % (ref 43.0–77.0)
Platelets: 230 10*3/uL (ref 150.0–400.0)
RBC: 4.67 Mil/uL (ref 3.87–5.11)
RDW: 12.4 % (ref 11.5–15.5)
WBC: 7.3 10*3/uL (ref 4.0–10.5)

## 2019-11-13 LAB — COMPREHENSIVE METABOLIC PANEL
ALT: 23 U/L (ref 0–35)
AST: 22 U/L (ref 0–37)
Albumin: 4.3 g/dL (ref 3.5–5.2)
Alkaline Phosphatase: 65 U/L (ref 39–117)
BUN: 10 mg/dL (ref 6–23)
CO2: 27 mEq/L (ref 19–32)
Calcium: 9.1 mg/dL (ref 8.4–10.5)
Chloride: 102 mEq/L (ref 96–112)
Creatinine, Ser: 0.76 mg/dL (ref 0.40–1.20)
GFR: 78.79 mL/min (ref >60.00)
Glucose, Bld: 83 mg/dL (ref 70–99)
Potassium: 3.5 mEq/L (ref 3.5–5.1)
Sodium: 135 mEq/L (ref 135–145)
Total Bilirubin: 1.2 mg/dL (ref 0.2–1.2)
Total Protein: 7 g/dL (ref 6.0–8.3)

## 2019-11-13 LAB — TSH: TSH: 0.01 u[IU]/mL — ABNORMAL LOW (ref 0.35–4.50)

## 2019-11-13 LAB — IBC PANEL
Iron: 96 ug/dL (ref 42–145)
Saturation Ratios: 34.1 % (ref 20.0–50.0)
Transferrin: 201 mg/dL — ABNORMAL LOW (ref 212.0–360.0)

## 2019-11-13 LAB — FERRITIN: Ferritin: 82.4 ng/mL (ref 10.0–291.0)

## 2019-11-13 LAB — C-REACTIVE PROTEIN: CRP: <1 mg/dL (ref 0.5–20.0)

## 2019-11-13 LAB — VITAMIN D 25 HYDROXY (VIT D DEFICIENCY, FRACTURES): VITD: 79.02 ng/mL (ref 30.00–100.00)

## 2019-11-13 LAB — SEDIMENTATION RATE: Sed Rate: 8 mm/hr (ref 0–30)

## 2019-11-13 LAB — URIC ACID: Uric Acid, Serum: 5.3 mg/dL (ref 2.4–7.0)

## 2019-11-13 NOTE — Assessment & Plan Note (Signed)
Decision today to treat with OMT was based on Physical Exam  After verbal consent patient was treated with HVLA, ME, FPR techniques in cervical, thoracic, rib,  lumbar and sacral areas  Patient tolerated the procedure well with improvement in symptoms  Patient given exercises, stretches and lifestyle modifications  See medications in patient instructions if given  Patient will follow up in 4-8 weeks 

## 2019-11-13 NOTE — Progress Notes (Signed)
[place] Phone: [phone] Subjective:   [name], am serving as a scribe for Dr. Hulan Saas. This visit occurred during the SARS-CoV-2 public health emergency.  Safety protocols were in place, including screening questions prior to the visit, additional usage of staff PPE, and extensive cleaning of exam room while observing appropriate contact time as indicated for disinfecting solutions.   I'm seeing this patient by the request  of:  Dian Queen, MD  CC: Neck pain and low back pain, bilateral knee pain all over pain, foot pain  [id number]   10/16/2019 Patient responded well to the injection.  Discussed icing regimen and home exercise, which activities to do which wants to avoid.  Patient knows about the icing protocol versus the heat.  Follow-up again in 3 to 6 weeks  Update 11/13/2019 [name] is a 54 y.o. female coming in with complaint of left foot, Morton's Neuroma. Patient states that the injection did help but pain has been increasing. Is having numbness in middle three toes. Patient has been going to chiropractor as her back started to bother her due to antalgic gait from the neuroma.   Also having bilateral knee pain. Patient feels like knee pain is getting worse but has had pain since her 41s. R>L with pain in posterior aspect. Patient feels weakness in the knees after sitting from prolonged periods.        Past Medical History:  Diagnosis Date  . Cancer ([place] [address]) 08/2006   neoplasm appendix-removed [name] chemo  . Family history of breast cancer   . Family history of prostate cancer   . Interstitial cystitis    mild  . Neoplasm of appendix   . Thyroid disease    Past Surgical History:  Procedure Laterality Date  . APPENDECTOMY  08/2006  . BREAST BIOPSY Right   . COLONOSCOPY  04/17/2007  . TUBAL LIGATION  11/1998  . UMBILICAL HERNIA REPAIR  10/2016   Social History    Socioeconomic History  . Marital status: Married    Spouse name: Not on file  . Number of children: Not on file  . Years of education: Not on file  . Highest education level: Not on file  Occupational History  . Not on file  Tobacco Use  . Smoking status: Never Smoker  . Smokeless tobacco: Never Used  Substance and Sexual Activity  . Alcohol use: Yes    Alcohol/week: 5.0 standard drinks    Types: 5 Glasses of wine per week    Comment: occasional  . Drug use: [name]  . Sexual activity: Not on file  Other Topics Concern  . Not on file  Social History Narrative  . Not on file   Social Determinants of Health   Financial Resource Strain:   . Difficulty of Paying Living Expenses: Not on file  Food Insecurity:   . Worried About Charity fundraiser in the Last Year: Not on file  . Ran Out of Food in the Last Year: Not on file  Transportation Needs:   . Lack of Transportation (Medical): Not on file  . Lack of Transportation (Non-Medical): Not on file  Physical Activity:   . Days of Exercise per Week: Not on file  . Minutes of Exercise per Session: Not on file  Stress:   . Feeling of Stress : Not on file  Social Connections:   . Frequency of Communication with Friends and Family: Not  on file  . Frequency of Social Gatherings with Friends and Family: Not on file  . Attends Religious Services: Not on file  . Active Member of Clubs or Organizations: Not on file  . Attends Archivist Meetings: Not on file  . Marital Status: Not on file   Allergies  Allergen Reactions  . Sulfur Hives  . Sulfa Antibiotics Hives and Rash    Reaction not listed   Family History  Problem Relation Age of Onset  . Breast cancer Mother 102       had hysterectomy at 48  . Skin cancer Brother        had a few removed over lifetime, one was on chest.  Is now 106  . Stomach cancer Maternal Grandmother   . Lung cancer Paternal Grandmother 67       dx. early 18's died 74  . Lung cancer  Paternal Grandfather 9       dx in 72's, died at 83  . Prostate cancer Maternal Uncle 70       died shortly after in 63's.  Metastatic- spread to brain and many other locations  . Colon cancer Neg Hx   . Esophageal cancer Neg Hx   . Pancreatic cancer Neg Hx   . Rectal cancer Neg Hx     Current Outpatient Medications (Endocrine & Metabolic):  Marland Kitchen  ARMOUR THYROID PO, Take 60 mg by mouth 2 (two) times daily.  Marland Kitchen  liothyronine (CYTOMEL) 25 MCG tablet, Take 50 mcg by mouth 2 (two) times daily.  .  progesterone (PROMETRIUM) 200 MG capsule, Take 400 mg by mouth 2 (two) times daily.   Current Outpatient Medications (Respiratory):  .  levocetirizine (XYZAL) 5 MG tablet, Take 5 mg by mouth every evening.    Current Outpatient Medications (Hematological):  Marland Kitchen  Cyanocobalamin (VITAMIN B-12 PO), Take 1 mg by mouth daily. .  IRON PO, Take 29 mg by mouth daily.  Current Outpatient Medications (Other):  .  cholecalciferol (VITAMIN D) 400 units TABS tablet, Take 400 Units by mouth. .  cyclobenzaprine (FLEXERIL) 10 MG tablet, Take 1 tablet (10 mg total) by mouth at bedtime. Marland Kitchen  EVENING PRIMROSE OIL PO, Take 1 tablet by mouth daily. Marland Kitchen  gabapentin (NEURONTIN) 100 MG capsule, Take 2 capsules (200 mg total) by mouth at bedtime. Marland Kitchen  MAGNESIUM CITRATE PO*, Take 1 tablet by mouth daily. .  Menaquinone-7 (VITAMIN K2 PO), Take 150 mcg by mouth daily. .  Multiple Vitamins-Minerals (MULTIVITAMIN ADULT PO), Take 1 tablet by mouth daily. .  Probiotic Product (PROBIOTIC PO), Take 1 tablet by mouth daily. .  Vitamin D, Ergocalciferol, (DRISDOL) 50000 units CAPS capsule, TAKE ONE CAPSULE WEEKLY * These medications belong to multiple therapeutic classes and are listed under each applicable group.   Reviewed prior external information including notes and imaging from  primary care provider As well as notes that were available from care everywhere and other healthcare systems.  Past medical history, social, surgical  and family history all reviewed in electronic medical record.  [name] pertanent information unless stated regarding to the chief complaint.   Review of Systems:  [name] headache, visual changes, nausea, vomiting, diarrhea, constipation, dizziness, abdominal pain, skin rash, fevers, chills, night sweats, weight loss, swollen lymph nodes, body aches, joint swelling, chest pain, shortness of breath, mood changes. POSITIVE muscle aches  Objective  Blood pressure 124/74, pulse 73, height 5\' 6"  (1.676 m), weight 179 lb (81.2 kg), SpO2 99 %.   General:  [name] apparent distress alert and oriented x3 mood and affect normal, dressed appropriately.  HEENT: Pupils equal, extraocular movements intact  Respiratory: Patient's speak in full sentences and does not appear short of breath  Cardiovascular: [name] lower extremity edema, non tender, [name] erythema  Skin: Warm dry intact with [name] signs of infection or rash on extremities or on axial skeleton.  Abdomen: Soft nontender  Neuro: Cranial nerves II through XII are intact, neurovascularly intact in all extremities with 2+ DTRs and 2+ pulses.  Lymph: [name] lymphadenopathy of posterior or anterior cervical chain or axillae bilaterally.  Gait normal with good balance and coordination.  MSK:  tender with full range of motion and good stability and symmetric strength and tone of shoulders, elbows, wrist, hip, knee and ankles bilaterally.  Foot exam shows the patient does have significant pes planus with normal foot noted.  Positive squeeze of the forefoot causes pain.  Pain between the third and fourth metatarsal heads.   Knee exam bilaterally shows the patient does have some patellar grinding noted with range of motion.  [name] significant instability noted today though.  Low back exam does have some loss of lordosis.  Tender to palpation in the paraspinal musculature lumbar spine left greater than right.  Diffusely tender to palpation though.  Patient with some mild limited range of  motion with FABER test on the right side.  Neck exam does have some mild loss of lordosis.  Negative Spurling's.  Tightness in the parascapular region.  Multiple trigger points noted.  Limited musculoskeletal ultrasound was performed and interpreted by Lyndal Pulley  Limited ultrasound of patient's left foot shows that patient does not have any significant enlargement of the neuroma noted today which is a significant improvement.  [name] cortical irregularity of the bone also noted. Impression: Normal  Osteopathic findings C2 flexed rotated and side bent right C4 flexed rotated and side bent left C6 flexed rotated and side bent left T3 extended rotated and side bent right inhaled third rib T9 extended rotated and side bent left L2 flexed rotated and side bent right Sacrum right on right    Impression and Recommendations:     This case required medical decision making of moderate complexity. The above documentation has been reviewed and is accurate and complete Lyndal Pulley, DO       Note: This dictation was prepared with Dragon dictation along with smaller phrase technology. Any transcriptional errors that result from this process are unintentional.

## 2019-11-13 NOTE — Assessment & Plan Note (Signed)
Minimal difference with the PRP with symptoms but patient on ultrasound I do not see any significant inflammation of the nerve at the moment.  Seems to have improved at least radiologically.  Patient is encouraged to continue to wear the orthotics, proper shoes, which activities to do which wants to avoid.  Patient will follow up with me again in 4 to 6 weeks

## 2019-11-13 NOTE — Patient Instructions (Addendum)
Exercises 3x a week Xray today Lab today Can get receipt up front [name]- See me again in 4-6 weeks

## 2019-11-13 NOTE — Assessment & Plan Note (Signed)
Chronic problem that seems to be out of proportion.  We discussed with patient about laboratory work-up to see if anything else is contributing to some of the neck and back pain.  Discussed home exercise, which activities to do which wants to avoid.  Follow-up again in 4 to 8 weeks

## 2019-11-15 LAB — PTH, INTACT AND CALCIUM
Calcium: 9.3 mg/dL (ref 8.6–10.4)
PTH: 18 pg/mL (ref 14–64)

## 2019-11-15 LAB — ANTI-NUCLEAR AB-TITER (ANA TITER): ANA Titer 1: 1:40 {titer} — ABNORMAL HIGH

## 2019-11-15 LAB — ANGIOTENSIN CONVERTING ENZYME: Angiotensin-Converting Enzyme: 11 U/L (ref 9–67)

## 2019-11-15 LAB — CYCLIC CITRUL PEPTIDE ANTIBODY, IGG: Cyclic Citrullin Peptide Ab: <16 UNITS

## 2019-11-15 LAB — ANA: Anti Nuclear Antibody (ANA): POSITIVE — AB

## 2019-11-15 LAB — RHEUMATOID FACTOR: Rheumatoid fact SerPl-aCnc: <14 IU/mL (ref <14)

## 2019-12-26 NOTE — Progress Notes (Signed)
[name] Phone: [phone] Subjective:   [name], am serving as a scribe for Dr. Hulan [name]. This visit occurred during the SARS-CoV-2 public health emergency.  Safety protocols were in place, including screening questions prior to the visit, additional usage of staff PPE, and extensive cleaning of exam room while observing appropriate contact time as indicated for disinfecting solutions.   I'm seeing this patient by the request  of:  Mary Queen, MD  CC: Low back pain, neck pain follow-up  [id number]   10/16/2019 Patient responded well to the injection.  Discussed icing regimen and home exercise, which activities to do which wants to avoid.  Patient knows about the icing protocol versus the heat.  Follow-up again in 3 to 6 weeks  Update 12/26/2019 [name] is a 54 y.o. female coming in with complaint of left foot, morton's neuroma. Last seen for PRP injection.  Patient had a neuroma and had failed all other conservative therapy.  Patient states  In addition of this patient usually does have chronic knee pain in neck and back pain.  Has responded well to manipulation previously.  Patient states that her foot is better.   Notes that she pulled proximal hamstring stretching 3 weeks ago.  Also having thoracic spine pain on right side.   Continues to have right knee pain. Unable to walk without pain. Does try to do 20-30 minutes each day. 6/10 pain every day. Stairs increase pain. Entire joint hurts.       Past Medical History:  Diagnosis Date  . 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 Family history of breast cancer   . Family history of prostate cancer   . Interstitial cystitis    mild  . Neoplasm of appendix   . Thyroid disease    Past Surgical History:  Procedure Laterality Date  . APPENDECTOMY  08/2006  . BREAST BIOPSY Right   . COLONOSCOPY  04/17/2007    . TUBAL LIGATION  11/1998  . UMBILICAL HERNIA REPAIR  10/2016   Social History   Socioeconomic History  . Marital status: Married    Spouse name: Not on file  . Number of children: Not on file  . Years of education: Not on file  . Highest education level: Not on file  Occupational History  . Not on file  Tobacco Use  . Smoking status: Never Smoker  . Smokeless tobacco: Never Used  Substance and Sexual Activity  . Alcohol use: Yes    Alcohol/week: 5.0 standard drinks    Types: 5 Glasses of wine per week    Comment: occasional  . Drug use: [name]  . Sexual activity: Not on file  Other Topics Concern  . Not on file  Social History Narrative  . Not on file   Social Determinants of Health   Financial Resource Strain:   . Difficulty of Paying Living Expenses:   Food Insecurity:   . Worried About Charity fundraiser in the Last Year:   . Arboriculturist in the Last Year:   Transportation Needs:   . Film/video editor (Medical):   Marland Kitchen Lack of Transportation (Non-Medical):   Physical Activity:   . Days of Exercise per Week:   . Minutes of Exercise per Session:   Stress:   . Feeling of Stress :   Social Connections:   . Frequency of Communication with Friends and  Family:   . Frequency of Social Gatherings with Friends and Family:   . Attends Religious Services:   . Active Member of Clubs or Organizations:   . Attends Archivist Meetings:   Marland Kitchen Marital Status:    Allergies  Allergen Reactions  . Sulfur Hives  . Sulfa Antibiotics Hives and Rash    Reaction not listed   Family History  Problem Relation Age of Onset  . Breast cancer Mother 24       had hysterectomy at 60  . Skin cancer Brother        had a few removed over lifetime, one was on chest.  Is now 33  . Stomach cancer Maternal Grandmother   . Lung cancer Paternal Grandmother 59       dx. early 48's died 21  . Lung cancer Paternal Grandfather 32       dx in 73's, died at 14  . Prostate cancer  Maternal Uncle 70       died shortly after in 53's.  Metastatic- spread to brain and many other locations  . Colon cancer Neg Hx   . Esophageal cancer Neg Hx   . Pancreatic cancer Neg Hx   . Rectal cancer Neg Hx     Current Outpatient Medications (Endocrine & Metabolic):  Marland Kitchen  ARMOUR THYROID PO, Take 60 mg by mouth 2 (two) times daily.  Marland Kitchen  liothyronine (CYTOMEL) 25 MCG tablet, Take 50 mcg by mouth 2 (two) times daily.  .  progesterone (PROMETRIUM) 200 MG capsule, Take 400 mg by mouth 2 (two) times daily.   Current Outpatient Medications (Respiratory):  .  levocetirizine (XYZAL) 5 MG tablet, Take 5 mg by mouth every evening.    Current Outpatient Medications (Hematological):  Marland Kitchen  Cyanocobalamin (VITAMIN B-12 PO), Take 1 mg by mouth daily. .  IRON PO, Take 29 mg by mouth daily.  Current Outpatient Medications (Other):  .  cholecalciferol (VITAMIN D) 400 units TABS tablet, Take 400 Units by mouth. .  cyclobenzaprine (FLEXERIL) 10 MG tablet, Take 1 tablet (10 mg total) by mouth at bedtime. Marland Kitchen  EVENING PRIMROSE OIL PO, Take 1 tablet by mouth daily. Marland Kitchen  gabapentin (NEURONTIN) 100 MG capsule, Take 2 capsules (200 mg total) by mouth at bedtime. Marland Kitchen  MAGNESIUM CITRATE PO*, Take 1 tablet by mouth daily. .  Menaquinone-7 (VITAMIN K2 PO), Take 150 mcg by mouth daily. .  Multiple Vitamins-Minerals (MULTIVITAMIN ADULT PO), Take 1 tablet by mouth daily. .  Probiotic Product (PROBIOTIC PO), Take 1 tablet by mouth daily. .  Vitamin D, Ergocalciferol, (DRISDOL) 50000 units CAPS capsule, TAKE ONE CAPSULE WEEKLY * These medications belong to multiple therapeutic classes and are listed under each applicable group.   Reviewed prior external information including notes and imaging from  primary care provider As well as notes that were available from care everywhere and other healthcare systems.  Past medical history, social, surgical and family history all reviewed in electronic medical record.  [name]  pertanent information unless stated regarding to the chief complaint.   Review of Systems:  [name] headache, visual changes, nausea, vomiting, diarrhea, constipation, dizziness, abdominal pain, skin rash, fevers, chills, night sweats, weight loss, swollen lymph nodes, body aches, joint swelling, chest pain, shortness of breath, mood changes. POSITIVE muscle aches  Objective  Blood pressure 112/82, pulse 80, height 5\' 6"  (1.676 m), weight 173 lb (78.5 kg).   General: [name] apparent distress alert and oriented x3 mood and affect normal, dressed appropriately.  HEENT: Pupils equal, extraocular movements intact  Respiratory: Patient's speak in full sentences and does not appear short of breath  Cardiovascular: [name] lower extremity edema, non tender, [name] erythema  Neuro: Cranial nerves II through XII are intact, neurovascularly intact in all extremities with 2+ DTRs and 2+ pulses.  Gait normal with good balance and coordination.  MSK:  Non tender with full range of motion and good stability and symmetric strength and tone of shoulders, elbows, wrist, hip, and ankles bilaterally.  Foot exam shows the patient does have some narrow foot noted.  Nontender on exam. Right knee exam shows the patient does have a positive patellar grind.  Patient does have some tenderness over the patellofemoral joint.  Trace effusion noted.  Lacks last 2 degrees of flexion.  [name] instability though with varus and valgus force   Limited musculoskeletal ultrasound was performed and interpreted by Lyndal Pulley  Limited ultrasound of patient's knee shows patient does have trace effusion noted indicating moderate narrowing of the patellofemoral joint.  Patient does have mild narrowing of the medial and lateral joint space.  Meniscus appear to be uninvolved. Impression: Patellofemoral arthritis.  Osteopathic findings  C2 flexed rotated and side bent right C4 flexed rotated and side bent left C6 flexed rotated and side bent left T3  extended rotated and side bent right inhaled third rib T9 extended rotated and side bent left L2 flexed rotated and side bent right Sacrum right on right    Impression and Recommendations:     This case required medical decision making of moderate complexity. The above documentation has been reviewed and is accurate and complete Lyndal Pulley, DO       Note: This dictation was prepared with Dragon dictation along with smaller phrase technology. Any transcriptional errors that result from this process are unintentional.

## 2019-12-27 ENCOUNTER — Other Ambulatory Visit: Payer: Self-pay

## 2019-12-27 ENCOUNTER — Ambulatory Visit: Payer: BC Managed Care – PPO | Admitting: Family Medicine

## 2019-12-27 ENCOUNTER — Encounter: Payer: Self-pay | Admitting: Family Medicine

## 2019-12-27 VITALS — BP 112/82 | HR 80 | Ht 66.0 in | Wt 173.0 lb

## 2019-12-27 DIAGNOSIS — G8929 Other chronic pain: Secondary | ICD-10-CM | POA: Diagnosis not present

## 2019-12-27 DIAGNOSIS — M1711 Unilateral primary osteoarthritis, right knee: Secondary | ICD-10-CM

## 2019-12-27 DIAGNOSIS — M545 Low back pain: Secondary | ICD-10-CM

## 2019-12-27 DIAGNOSIS — M999 Biomechanical lesion, unspecified: Secondary | ICD-10-CM | POA: Diagnosis not present

## 2019-12-27 NOTE — Assessment & Plan Note (Signed)
Chronic problem with mild exacerbation.  Medication management discussed including anti-inflammatories

## 2019-12-27 NOTE — Patient Instructions (Addendum)
Trupull lite Exercises 3x a week See me in 6-8 weeks

## 2019-12-27 NOTE — Assessment & Plan Note (Signed)
Home exercise, icing regimen, Tru pull lite.  Worsening symptoms physical therapy and injection will be needed.

## 2019-12-27 NOTE — Assessment & Plan Note (Signed)

## 2020-02-18 ENCOUNTER — Ambulatory Visit: Payer: BC Managed Care – PPO | Admitting: Family Medicine

## 2020-03-24 ENCOUNTER — Other Ambulatory Visit: Payer: Self-pay | Admitting: Obstetrics and Gynecology

## 2020-03-24 DIAGNOSIS — Z1231 Encounter for screening mammogram for malignant neoplasm of breast: Secondary | ICD-10-CM

## 2020-03-31 ENCOUNTER — Telehealth: Payer: Self-pay | Admitting: Family Medicine

## 2020-03-31 NOTE — Telephone Encounter (Signed)
Patient called stating that she is getting ready to go out of town and has been having some very extreme upper back pain. She wanted to know if Dr [name] could see her this week?

## 2020-03-31 NOTE — Telephone Encounter (Signed)
Sent patient MyChart message.

## 2020-04-03 ENCOUNTER — Encounter: Payer: Self-pay | Admitting: Family Medicine

## 2020-04-03 ENCOUNTER — Ambulatory Visit: Payer: BC Managed Care – PPO | Admitting: Family Medicine

## 2020-04-03 ENCOUNTER — Other Ambulatory Visit: Payer: Self-pay

## 2020-04-03 VITALS — BP 102/62 | HR 85 | Ht 66.0 in | Wt 181.0 lb

## 2020-04-03 DIAGNOSIS — M545 Low back pain: Secondary | ICD-10-CM | POA: Diagnosis not present

## 2020-04-03 DIAGNOSIS — G8929 Other chronic pain: Secondary | ICD-10-CM | POA: Diagnosis not present

## 2020-04-03 DIAGNOSIS — M999 Biomechanical lesion, unspecified: Secondary | ICD-10-CM

## 2020-04-03 MED ORDER — PREDNISONE 50 MG PO TABS: ORAL_TABLET | ORAL | 0 refills | Status: DC

## 2020-04-03 NOTE — Assessment & Plan Note (Signed)
Back pain doing much better overall.  Discussed which activities to do which wants to avoid.  Increase activity slowly.  Discussed icing regimen.  Follow-up again in 6 to 8 weeks prednisone given with patient traveling out of the state.  Has gabapentin and Zanaflex as well for breakthrough

## 2020-04-03 NOTE — Progress Notes (Signed)
[place] Phone: [phone] Subjective:   [name], am serving as a scribe for Dr. Hulan Saas.This visit occurred during the SARS-CoV-2 public health emergency.  Safety protocols were in place, including screening questions prior to the visit, additional usage of staff PPE, and extensive cleaning of exam room while observing appropriate contact time as indicated for disinfecting solutions.  I'm seeing this patient by the request  of:  Dian Queen, MD  CC: Low back pain, back pain  [id number]  [name] is a 54 y.o. female coming in with complaint of back and neck pain. OMT 12/27/2019. Patient states that she is having pain between scapula for one week after lifting heavy items. Has felt improvement with stretching and walking. L>R.   Medications patient has been prescribed: Zanaflex  Taking: Yes         Reviewed prior external information including notes and imaging from previsou exam, outside providers and external EMR if available.   As well as notes that were available from care everywhere and other healthcare systems.  Past medical history, social, surgical and family history all reviewed in electronic medical record.  [name] pertanent information unless stated regarding to the chief complaint.   Past Medical History:  Diagnosis Date  . 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 Family history of breast cancer   . Family history of prostate cancer   . Interstitial cystitis    mild  . Neoplasm of appendix   . Thyroid disease     Allergies  Allergen Reactions  . Sulfur Hives  . Sulfa Antibiotics Hives and Rash    Reaction not listed     Review of Systems:  [name] headache, visual changes, nausea, vomiting, diarrhea, constipation, dizziness, abdominal pain, skin rash, fevers, chills, night sweats, weight loss, swollen lymph nodes, body aches, joint swelling, chest  pain, shortness of breath, mood changes. POSITIVE muscle aches  Objective  There were [name] vitals taken for this visit.   General: [name] apparent distress alert and oriented x3 mood and affect normal, dressed appropriately.  HEENT: Pupils equal, extraocular movements intact  Respiratory: Patient's speak in full sentences and does not appear short of breath  Cardiovascular: [name] lower extremity edema, non tender, [name] erythema  Neuro: Cranial nerves II through XII are intact, neurovascularly intact in all extremities with 2+ DTRs and 2+ pulses.  Gait normal with good balance and coordination.  MSK:  Non tender with full range of motion and good stability and symmetric strength and tone of shoulders, elbows, wrist, hip, knee and ankles bilaterally.  Back - Normal skin, Spine with normal alignment and [name] deformity.  [name] tenderness to vertebral process palpation.  Paraspinous muscles are not tender and without spasm.   Range of motion is full at neck and lumbar sacral regions  Osteopathic findings  C2 flexed rotated and side bent right C6 flexed rotated and side bent left T3 extended rotated and side bent right inhaled rib T9 extended rotated and side bent left L2 flexed rotated and side bent right Sacrum right on right       Assessment and Plan:  Back pain Back pain doing much better overall.  Discussed which activities to do which wants to avoid.  Increase activity slowly.  Discussed icing regimen.  Follow-up again in 6 to 8 weeks prednisone given with patient traveling out of the state.  Has gabapentin and Zanaflex as well  for breakthrough    Nonallopathic problems  Decision today to treat with OMT was based on Physical Exam  After verbal consent patient was treated with HVLA, ME, FPR techniques in cervical, rib, thoracic, lumbar, and sacral  areas  Patient tolerated the procedure well with improvement in symptoms  Patient given exercises, stretches and lifestyle modifications  See  medications in patient instructions if given  Patient will follow up in 4-8 weeks      The above documentation has been reviewed and is accurate and complete Lyndal Pulley, DO       Note: This dictation was prepared with Dragon dictation along with smaller phrase technology. Any transcriptional errors that result from this process are unintentional.

## 2020-04-03 NOTE — Patient Instructions (Signed)
See me in 6-8 weeks Prednisone at your pharmacy

## 2020-05-09 ENCOUNTER — Other Ambulatory Visit: Payer: Self-pay

## 2020-05-09 ENCOUNTER — Ambulatory Visit
Admission: RE | Admit: 2020-05-09 | Discharge: 2020-05-09 | Disposition: A | Discharge status: Home / Self Care | Payer: BC Managed Care – PPO | Source: Ambulatory Visit | Attending: Obstetrics and Gynecology | Admitting: Obstetrics and Gynecology

## 2020-05-09 DIAGNOSIS — Z1231 Encounter for screening mammogram for malignant neoplasm of breast: Secondary | ICD-10-CM

## 2020-06-11 ENCOUNTER — Ambulatory Visit (INDEPENDENT_AMBULATORY_CARE_PROVIDER_SITE_OTHER): Payer: BC Managed Care – PPO

## 2020-06-11 ENCOUNTER — Encounter: Payer: Self-pay | Admitting: Family Medicine

## 2020-06-11 ENCOUNTER — Other Ambulatory Visit: Payer: Self-pay

## 2020-06-11 ENCOUNTER — Ambulatory Visit: Payer: Self-pay

## 2020-06-11 ENCOUNTER — Ambulatory Visit: Payer: BC Managed Care – PPO | Admitting: Family Medicine

## 2020-06-11 VITALS — BP 122/82 | HR 71 | Ht 66.0 in | Wt 173.0 lb

## 2020-06-11 DIAGNOSIS — M79672 Pain in left foot: Secondary | ICD-10-CM

## 2020-06-11 DIAGNOSIS — G5762 Lesion of plantar nerve, left lower limb: Secondary | ICD-10-CM

## 2020-06-11 DIAGNOSIS — M62838 Other muscle spasm: Secondary | ICD-10-CM | POA: Diagnosis not present

## 2020-06-11 DIAGNOSIS — M999 Biomechanical lesion, unspecified: Secondary | ICD-10-CM | POA: Diagnosis not present

## 2020-06-11 MED ORDER — GABAPENTIN 100 MG PO CAPS: 200.0000 mg | ORAL_CAPSULE | Freq: Every day | ORAL | 0 refills | Status: DC

## 2020-06-11 NOTE — Progress Notes (Signed)
Mary [name] [place] Phone: [phone] Subjective:   Mary [name], am serving as a scribe for Dr. Hulan Saas. This visit occurred during the SARS-CoV-2 public health emergency.  Safety protocols were in place, including screening questions prior to the visit, additional usage of staff PPE, and extensive cleaning of exam room while observing appropriate contact time as indicated for disinfecting solutions.   I'm seeing this patient by the request  of:  Mary Queen, MD  CC: Foot pain, back pain  [id number]   11/13/2019 Minimal difference with the PRP with symptoms but patient on ultrasound I do not see any significant inflammation of the nerve at the moment.  Seems to have improved at least radiologically.  Patient is encouraged to continue to wear the orthotics, proper shoes, which activities to do which wants to avoid.  Patient will follow up with me again in 4 to 6 weeks   Update 06/11/2020 Mary [name] is a 54 y.o. female coming in with complaint of back and neck pain. OMT 04/03/2020. Patient also would like to address morton's neuroma of left foot again. Patient states that at night she has pain with the sock being on that foot. Changed orthotics recently as the others had broken down. Pain over the metatarsal heads and numbness in the toes.   Also having left scapula pain. Has been traveling to [place] and [place].           Reviewed prior external information including notes and imaging from previsou exam, outside providers and external EMR if available.   As well as notes that were available from care everywhere and other healthcare systems.  Past medical history, social, surgical and family history all reviewed in electronic medical record.  [name] pertanent information unless stated regarding to the chief complaint.   Past Medical History:  Diagnosis Date  . Cancer ([place]) 08/2006   neoplasm  appendix-removed [name] chemo  . Family history of breast cancer   . Family history of prostate cancer   . Interstitial cystitis    mild  . Neoplasm of appendix   . Thyroid disease     Allergies  Allergen Reactions  . Sulfur Hives  . Sulfa Antibiotics Hives and Rash    Reaction not listed     Review of Systems:  [name] headache, visual changes, nausea, vomiting, diarrhea, constipation, dizziness, abdominal pain, skin rash, fevers, chills, night sweats, weight loss, swollen lymph nodes, body aches, joint swelling, chest pain, shortness of breath, mood changes. POSITIVE muscle aches  Objective  There were [name] vitals taken for this visit.   General: [name] apparent distress alert and oriented x3 mood and affect normal, dressed appropriately.  HEENT: Pupils equal, extraocular movements intact  Respiratory: Patient's speak in full sentences and does not appear short of breath  Cardiovascular: [name] lower extremity edema, non tender, [name] erythema  Neuro: Cranial nerves II through XII are intact, neurovascularly intact in all extremities with 2+ DTRs and 2+ pulses.  Gait normal with good balance and coordination.  MSK:  Non tender with full range of motion and good stability and symmetric strength and tone of shoulders, elbows, wrist, hip, knee and ankles bilaterally.  Back pain shows the patient scapular on the medial aspect does have discomfort and pain with some mild tightness.  Patient's neck does have some very mild loss of lordosis.  Negative Spurling's are noted today.  Foot exam shows the patient does have significant neuropathy.  Patient does have significant breakdown of the longitudinal foot left than the right with a positive squeeze test noted. Back -  Osteopathic findings  C6 flexed rotated and side bent left T3 extended rotated and side left right inhaled rib T7 extended rotated and side bent left L2 flexed rotated and side bent right Sacrum right on right       Assessment and  Plan:   Neck muscle spasm Tenderness noted in the neck as well as in the parascapular region.  Discussed with patient about posture and ergonomics, discussed which activities to do which wants to avoid.  Patient is responding well to manipulation.  It is more about chronic problem with exacerbation.  Expiratory limb gabapentin.  Follow-up again 6 to 8 weeks.  Morton's neuroma of left foot Neuroma worsening again.  Patient does have orthotics from an outside facility that hopefully will be beneficial.  We discussed with patient about the potential for injections or repeat imaging PRP.  Patient wants to hold at this time and encouraged her to start the gabapentin again.  If continuing to have pain can consider also MRI of the foot with this being very longstanding follow-up again 6 weeks.    Nonallopathic problems  Decision today to treat with OMT was based on Physical Exam  After verbal consent patient was treated with HVLA, ME, FPR techniques in cervical, rib, thoracic, lumbar, and sacral  areas  Patient tolerated the procedure well with improvement in symptoms  Patient given exercises, stretches and lifestyle modifications  See medications in patient instructions if given  Patient will follow up in 4-8 weeks      The above documentation has been reviewed and is accurate and complete Mary Pulley, DO       Note: This dictation was prepared with Dragon dictation along with smaller phrase technology. Any transcriptional errors that result from this process are unintentional.

## 2020-06-11 NOTE — Assessment & Plan Note (Signed)
Neuroma worsening again.  Patient does have orthotics from an outside facility that hopefully will be beneficial.  We discussed with patient about the potential for injections or repeat imaging PRP.  Patient wants to hold at this time and encouraged her to start the gabapentin again.  If continuing to have pain can consider also MRI of the foot with this being very longstanding follow-up again 6 weeks.

## 2020-06-11 NOTE — Patient Instructions (Addendum)
Xray today Happy Anniversary Gabapentin 200mg  at night Nexium daily for 2 weeks Continue to watch foot See me in 6-7 weeks

## 2020-06-11 NOTE — Assessment & Plan Note (Signed)
Tenderness noted in the neck as well as in the parascapular region.  Discussed with patient about posture and ergonomics, discussed which activities to do which wants to avoid.  Patient is responding well to manipulation.  It is more about chronic problem with exacerbation.  Expiratory limb gabapentin.  Follow-up again 6 to 8 weeks.

## 2020-07-28 NOTE — Progress Notes (Signed)
[place] Phone: [phone] Subjective:   [name], am serving as a scribe for Dr. Hulan Saas. This visit occurred during the SARS-CoV-2 public health emergency.  Safety protocols were in place, including screening questions prior to the visit, additional usage of staff PPE, and extensive cleaning of exam room while observing appropriate contact time as indicated for disinfecting solutions.   I'm seeing this patient by the request  of:  Dian Queen, MD  CC: Back pain follow-up  [id number]   06/11/2020 Neuroma worsening again.  Patient does have orthotics from an outside facility that hopefully will be beneficial.  We discussed with patient about the potential for injections or repeat imaging PRP.  Patient wants to hold at this time and encouraged her to start the gabapentin again.  If continuing to have pain can consider also MRI of the foot with this being very longstanding follow-up again 6 weeks.  Update 07/29/2020 [name] is a 54 y.o. female coming in with complaint of back and neck pain. OMT 06/11/2020. Patient states that she did have some relief one day after her last appt. Patient did take Nexium and the back pain was relieved but her GI issues were not. Is now taking another medication which has helped reflux but not her back.  Patient states that she does not want to be on the PPI long-term now.  Foot pain is tender in morning. Some numbness on top of foot. Using good feet orthotics which she thinks help to decrease her pain. Has used gabapentin prn.   Medications patient has been prescribed:     Taking:         Reviewed prior external information including notes and imaging from previsou exam, outside providers and external EMR if available.   As well as notes that were available from care everywhere and other healthcare systems.  Past medical history, social, surgical and  family history all reviewed in electronic medical record.  [name] pertanent information unless stated regarding to the chief complaint.   Past Medical History:  Diagnosis Date  . 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 Family history of breast cancer   . Family history of prostate cancer   . Interstitial cystitis    mild  . Neoplasm of appendix   . Thyroid disease     Allergies  Allergen Reactions  . Sulfur Hives  . Sulfa Antibiotics Hives and Rash    Reaction not listed     Review of Systems:  [name] headache, visual changes, nausea, vomiting, diarrhea, constipation, dizziness, abdominal pain, skin rash, fevers, chills, night sweats, weight loss, swollen lymph nodes, body aches, joint swelling, chest pain, shortness of breath, mood changes. POSITIVE muscle aches  Objective  Blood pressure 120/84, pulse 84, height 5\' 6"  (1.676 m), weight 163 lb (73.9 kg), SpO2 99 %.   General: [name] apparent distress alert and oriented x3 mood and affect normal, dressed appropriately.  HEENT: Pupils equal, extraocular movements intact  Respiratory: Patient's speak in full sentences and does not appear short of breath  Cardiovascular: [name] lower extremity edema, non tender, [name] erythema  Gait normal with good balance and coordination.  Right shoulder exam shows the patient has near full range of motion.  Patient does have tightness in the parascapular region in the right side of the neck.  Mild pain also noted with in the epigastric region on exam today. Low back exam  does have tightness but [name] radicular symptoms.  Osteopathic findings  C2 flexed rotated and side bent right C7 flexed rotated and side bent left T4 extended rotated and side bent right inhaled rib L3 flexed rotated and side bent right Sacrum right on right       Assessment and Plan:  GERD (gastroesophageal reflux disease) Patient does have what appears to be more reflux disease that I think is contributing to some  of the upper back pain.  Patient has never seen gastroenterology and I think it would be beneficial.  Will refer patient accordingly.  See if they have anything else.  Patient did respond well to a trial of an PPI but does not want to be on it long-term.  Morton's neuroma of left foot Stable and we will continue to monitor    Nonallopathic problems  Decision today to treat with OMT was based on Physical Exam  After verbal consent patient was treated with HVLA, ME, FPR techniques in cervical, rib, thoracic, lumbar, and sacral  areas  Patient tolerated the procedure well with improvement in symptoms  Patient given exercises, stretches and lifestyle modifications  See medications in patient instructions if given  Patient will follow up in 4-8 weeks      The above documentation has been reviewed and is accurate and complete Lyndal Pulley, DO       Note: This dictation was prepared with Dragon dictation along with smaller phrase technology. Any transcriptional errors that result from this process are unintentional.

## 2020-07-29 ENCOUNTER — Ambulatory Visit: Payer: BC Managed Care – PPO | Admitting: Family Medicine

## 2020-07-29 ENCOUNTER — Encounter: Payer: Self-pay | Admitting: Family Medicine

## 2020-07-29 ENCOUNTER — Other Ambulatory Visit: Payer: Self-pay

## 2020-07-29 VITALS — BP 120/84 | HR 84 | Ht 66.0 in | Wt 163.0 lb

## 2020-07-29 DIAGNOSIS — G5762 Lesion of plantar nerve, left lower limb: Secondary | ICD-10-CM

## 2020-07-29 DIAGNOSIS — M999 Biomechanical lesion, unspecified: Secondary | ICD-10-CM | POA: Diagnosis not present

## 2020-07-29 DIAGNOSIS — K219 Gastro-esophageal reflux disease without esophagitis: Secondary | ICD-10-CM | POA: Diagnosis not present

## 2020-07-29 NOTE — Patient Instructions (Addendum)
They will call you Keep working on posture See me in 6-8 weeks

## 2020-07-29 NOTE — Assessment & Plan Note (Signed)
Patient does have what appears to be more reflux disease that I think is contributing to some of the upper back pain.  Patient has never seen gastroenterology and I think it would be beneficial.  Will refer patient accordingly.  See if they have anything else.  Patient did respond well to a trial of an PPI but does not want to be on it long-term.

## 2020-07-29 NOTE — Assessment & Plan Note (Signed)
Stable and we will continue to monitor. 

## 2020-08-06 ENCOUNTER — Encounter: Payer: Self-pay | Admitting: Gastroenterology

## 2020-08-06 ENCOUNTER — Other Ambulatory Visit: Payer: Self-pay | Admitting: Family Medicine

## 2020-08-06 DIAGNOSIS — D499 Neoplasm of unspecified behavior of unspecified site: Secondary | ICD-10-CM

## 2020-08-06 DIAGNOSIS — R091 Pleurisy: Secondary | ICD-10-CM

## 2020-08-19 ENCOUNTER — Other Ambulatory Visit: Payer: Self-pay | Admitting: Obstetrics and Gynecology

## 2020-08-19 DIAGNOSIS — Z803 Family history of malignant neoplasm of breast: Secondary | ICD-10-CM

## 2020-08-21 ENCOUNTER — Other Ambulatory Visit: Payer: Self-pay

## 2020-08-21 ENCOUNTER — Ambulatory Visit
Admission: RE | Admit: 2020-08-21 | Discharge: 2020-08-21 | Disposition: A | Discharge status: Home / Self Care | Payer: BC Managed Care – PPO | Source: Ambulatory Visit | Attending: Family Medicine | Admitting: Family Medicine

## 2020-08-21 DIAGNOSIS — D499 Neoplasm of unspecified behavior of unspecified site: Secondary | ICD-10-CM

## 2020-08-21 DIAGNOSIS — R091 Pleurisy: Secondary | ICD-10-CM

## 2020-09-02 ENCOUNTER — Encounter: Payer: Self-pay | Admitting: Family Medicine

## 2020-09-02 ENCOUNTER — Ambulatory Visit: Payer: BC Managed Care – PPO | Admitting: Family Medicine

## 2020-09-02 ENCOUNTER — Other Ambulatory Visit: Payer: Self-pay

## 2020-09-02 VITALS — BP 120/70 | HR 73 | Ht 66.0 in | Wt 165.0 lb

## 2020-09-02 DIAGNOSIS — M999 Biomechanical lesion, unspecified: Secondary | ICD-10-CM | POA: Diagnosis not present

## 2020-09-02 DIAGNOSIS — G8929 Other chronic pain: Secondary | ICD-10-CM | POA: Diagnosis not present

## 2020-09-02 DIAGNOSIS — G5762 Lesion of plantar nerve, left lower limb: Secondary | ICD-10-CM | POA: Diagnosis not present

## 2020-09-02 DIAGNOSIS — M545 Low back pain, unspecified: Secondary | ICD-10-CM

## 2020-09-02 NOTE — Assessment & Plan Note (Signed)
?   Need for another PRP or get MRI first continue orthotics discussed avoiding being barefoot.  Patient will consider this at follow-up.  Continue the gabapentin

## 2020-09-02 NOTE — Assessment & Plan Note (Signed)
Chronic overall.Patient has had x-rays previously in 2012.I did show the patient has some facet arthritic changes but otherwise fairly unremarkable.  No significant radicular symptoms at this time.  Does respond well with the manipulation, home exercises and icing regimen.  Does have gabapentin as well [place] for more of the neuroma.  Follow-up again 6 weeks

## 2020-09-02 NOTE — Progress Notes (Signed)
[place] [address] [place] Phone: [phone] Subjective:   I [name] am serving as a Education administrator for Dr. Hulan Saas.  This visit occurred during the SARS-CoV-2 public health emergency.  Safety protocols were in place, including screening questions prior to the visit, additional usage of staff PPE, and extensive cleaning of exam room while observing appropriate contact time as indicated for disinfecting solutions.   I'm seeing this patient by the request  of:  Dian Queen, MD  CC: Low back pain follow-up, foot pain follow-up  [id number]  [name] is a 54 y.o. female coming in with complaint of back and neck pain. OMT 08/05/2020. Patient states she is doing better. States she was dealing with a lot of stress before Christmas. Got a massage that helped. Some lower back pain. Not as much shoulder pain and reflux. CT scan was normal and did not show she has a hernia.  Patient states that just some more stress recently.  Also being worked up for more cystic changes in the breast.  Has had MRI soon.  Patient is taking the gabapentin.  Noticing more discomfort recently in the foot as well.  No new symptoms just worsening of previous symptoms  Medications patient has been prescribed: Gabapentin  Taking: Yes         Reviewed prior external information including notes and imaging from previsou exam, outside providers and external EMR if available.   As well as notes that were available from care everywhere and other healthcare systems.  Past medical history, social, surgical and family history all reviewed in electronic medical record.  No pertanent information unless stated regarding to the chief complaint.   Past Medical History:  Diagnosis Date  . 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 Family history of breast cancer   . Family history of prostate cancer   . Interstitial cystitis    mild  .  Neoplasm of appendix   . Thyroid disease     Allergies  Allergen Reactions  . Sulfur Hives  . Sulfa Antibiotics Hives and Rash    Reaction not listed     Review of Systems:  No headache, visual changes, nausea, vomiting, diarrhea, constipation, dizziness, abdominal pain, skin rash, fevers, chills, night sweats, weight loss, swollen lymph nodes,  joint swelling, chest pain, shortness of breath, mood changes. POSITIVE muscle aches, body aches  Objective  Blood pressure 120/70, pulse 73, height 5\' 6"  (1.676 m), weight 165 lb (74.8 kg), SpO2 99 %.   General: No apparent distress alert and oriented x3 mood and affect normal, dressed appropriately.  HEENT: Pupils equal, extraocular movements intact  Respiratory: Patient's speak in full sentences and does not appear short of breath  Cardiovascular: No lower extremity edema, non tender, no erythema  Neuro: Cranial nerves II through XII are intact, neurovascularly intact in all extremities with 2+ DTRs and 2+ pulses.  Gait normal with good balance and coordination.  MSK:  Non tender with full range of motion and good stability and symmetric strength and tone of shoulders, elbows, wrist, hip, knee and ankles bilaterally.  Back - Normal skin, Spine with normal alignment and no deformity.  No tenderness to vertebral process palpation.  Paraspinous muscles are not tender and without spasm.   Range of motion is full at neck and lumbar sacral regions  Osteopathic findings  C5 flexed rotated and side bent right T4 extended rotated and side bent right  inhaled rib T8 extended rotated and side bent left L3 flexed rotated and side bent right Sacrum right on right       Assessment and Plan: Back pain Chronic overall.Patient has had x-rays previously in 2012.I did show the patient has some facet arthritic changes but otherwise fairly unremarkable.  No significant radicular symptoms at this time.  Does respond well with the manipulation, home  exercises and icing regimen.  Does have gabapentin as well [place] for more of the neuroma.  Follow-up again 6 weeks  Morton's neuroma of left foot ? Need for another PRP or get MRI first continue orthotics discussed avoiding being barefoot.  Patient will consider this at follow-up.  Continue the gabapentin       Nonallopathic problems  Decision today to treat with OMT was based on Physical Exam  After verbal consent patient was treated with HVLA, ME, FPR techniques in cervical, rib, thoracic, lumbar, and sacral  areas  Patient tolerated the procedure well with improvement in symptoms  Patient given exercises, stretches and lifestyle modifications  See medications in patient instructions if given  Patient will follow up in 4-8 weeks      The above documentation has been reviewed and is accurate and complete Judi Saa, DO       Note: This dictation was prepared with Dragon dictation along with smaller phrase technology. Any transcriptional errors that result from this process are unintentional.

## 2020-09-02 NOTE — Patient Instructions (Addendum)
Good to see you Overall doing well Keep wearing the good shoes  For the foot we can consider PRP for neuroma or MRI See me again in 7-8 weeks

## 2020-09-04 ENCOUNTER — Ambulatory Visit
Admission: RE | Admit: 2020-09-04 | Discharge: 2020-09-04 | Disposition: A | Discharge status: Home / Self Care | Payer: BC Managed Care – PPO | Source: Ambulatory Visit | Attending: Obstetrics and Gynecology | Admitting: Obstetrics and Gynecology

## 2020-09-04 DIAGNOSIS — Z803 Family history of malignant neoplasm of breast: Secondary | ICD-10-CM

## 2020-09-04 MED ORDER — GADOBUTROL 1 MMOL/ML IV SOLN
8.0000 mL | Freq: Once | INTRAVENOUS | Status: AC | PRN
  Administered 2020-09-04: 8 mL via INTRAVENOUS

## 2020-09-06 HISTORY — PX: BREAST CYST ASPIRATION: SHX578

## 2020-09-10 ENCOUNTER — Other Ambulatory Visit: Payer: Self-pay | Admitting: Obstetrics and Gynecology

## 2020-09-10 DIAGNOSIS — N6001 Solitary cyst of right breast: Secondary | ICD-10-CM

## 2020-09-12 ENCOUNTER — Ambulatory Visit: Payer: BC Managed Care – PPO | Admitting: Family Medicine

## 2020-09-14 ENCOUNTER — Other Ambulatory Visit: Payer: BC Managed Care – PPO

## 2020-09-16 ENCOUNTER — Other Ambulatory Visit: Payer: Self-pay

## 2020-09-16 ENCOUNTER — Ambulatory Visit
Admission: RE | Admit: 2020-09-16 | Discharge: 2020-09-16 | Disposition: A | Discharge status: Home / Self Care | Payer: 59 | Source: Ambulatory Visit | Attending: Obstetrics and Gynecology | Admitting: Obstetrics and Gynecology

## 2020-09-16 DIAGNOSIS — N6001 Solitary cyst of right breast: Secondary | ICD-10-CM

## 2020-09-23 ENCOUNTER — Encounter: Payer: Self-pay | Admitting: Gastroenterology

## 2020-09-23 ENCOUNTER — Ambulatory Visit: Payer: 59 | Admitting: Gastroenterology

## 2020-09-23 VITALS — BP 128/76 | HR 80 | Ht 66.0 in | Wt 167.0 lb

## 2020-09-23 DIAGNOSIS — R6889 Other general symptoms and signs: Secondary | ICD-10-CM

## 2020-09-23 DIAGNOSIS — K219 Gastro-esophageal reflux disease without esophagitis: Secondary | ICD-10-CM

## 2020-09-23 DIAGNOSIS — R0989 Other specified symptoms and signs involving the circulatory and respiratory systems: Secondary | ICD-10-CM

## 2020-09-23 NOTE — Progress Notes (Signed)
History of Present Illness: This is a 55 year old female referred by Mary Queen, MD for the evaluation of suspected GERD.  Since July she relates frequent problems with bitter taste in her mouth in the mornings, frequent throat clearing, belching and left shoulder blade area pain.  Her PCP and sports medicine physician were concerned about reflux and she was initially treated with Nexium with some symptom improvement.  She was then changed to Protonix with substantial improvement in symptoms.  She had a series of massages while taking Protonix and her left shoulder blade area pain has completely resolved.  Her throat clearing has substantially improved but has not abated. She still notes mild problems with belching.  She relates that her PCP ordered a CT of the abdomen and pelvis in November which was unremarkable and serum H. pylori antibodies were negative.  I do not have those results to review at this time. Denies weight loss, abdominal pain, constipation, diarrhea, change in stool caliber, melena, hematochezia, nausea, vomiting, dysphagia, chest pain.    Allergies  Allergen Reactions  . Elemental Sulfur Hives  . Sulfa Antibiotics Hives and Rash    Reaction not listed   Outpatient Medications Prior to Visit  Medication Sig Dispense Refill  . ARMOUR THYROID PO Take 60 mg by mouth 2 (two) times daily.     . cholecalciferol (VITAMIN D) 400 units TABS tablet Take 400 Units by mouth.    . Cyanocobalamin (VITAMIN B-12 PO) Take 1 mg by mouth daily.    Marland Kitchen EVENING PRIMROSE OIL PO Take 1 tablet by mouth daily.    . IRON PO Take 29 mg by mouth daily.    Marland Kitchen levocetirizine (XYZAL) 5 MG tablet Take 5 mg by mouth every evening.     Marland Kitchen liothyronine (CYTOMEL) 25 MCG tablet Take 50 mcg by mouth 2 (two) times daily.   3  . MAGNESIUM CITRATE PO Take 1 tablet by mouth daily.    . Multiple Vitamins-Minerals (MULTIVITAMIN ADULT PO) Take 1 tablet by mouth daily.    . pantoprazole (PROTONIX) 40 MG tablet  Take 40 mg by mouth daily.    . Probiotic Product (PROBIOTIC PO) Take 1 tablet by mouth daily.    . progesterone (PROMETRIUM) 200 MG capsule Take 400 mg by mouth 2 (two) times daily.    . Vitamin D, Ergocalciferol, (DRISDOL) 50000 units CAPS capsule TAKE ONE CAPSULE WEEKLY 12 capsule 0  . cyclobenzaprine (FLEXERIL) 10 MG tablet Take 1 tablet (10 mg total) by mouth at bedtime. 30 tablet 0  . gabapentin (NEURONTIN) 100 MG capsule Take 2 capsules (200 mg total) by mouth at bedtime. 60 capsule 3  . gabapentin (NEURONTIN) 100 MG capsule Take 2 capsules (200 mg total) by mouth at bedtime. 180 capsule 0  . Menaquinone-7 (VITAMIN K2 PO) Take 150 mcg by mouth daily.    . predniSONE (DELTASONE) 50 MG tablet Take one tablet daily for the next 5 days. 5 tablet 0   No facility-administered medications prior to visit.   Past Medical History:  Diagnosis Date  . 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 Family history of breast cancer   . Family history of prostate cancer   . Interstitial cystitis    mild  . Neoplasm of appendix   . Thyroid disease    Past Surgical History:  Procedure Laterality Date  . APPENDECTOMY  08/2006  . BREAST BIOPSY Right   . COLONOSCOPY  04/17/2007  . TUBAL  LIGATION  11/1998  . UMBILICAL HERNIA REPAIR  10/2016   Social History   Socioeconomic History  . Marital status: Married    Spouse name: Not on file  . Number of children: 2  . Years of education: Not on file  . Highest education level: Not on file  Occupational History  . Not on file  Tobacco Use  . Smoking status: Never Smoker  . Smokeless tobacco: Never Used  Vaping Use  . Vaping Use: Never used  Substance and Sexual Activity  . Alcohol use: Yes    Alcohol/week: 5.0 standard drinks    Types: 5 Glasses of wine per week    Comment: occasional  . Drug use: No  . Sexual activity: Not on file  Other Topics Concern  . Not on file  Social History Narrative  . Not on file   Social  Determinants of Health   Financial Resource Strain: Not on file  Food Insecurity: Not on file  Transportation Needs: Not on file  Physical Activity: Not on file  Stress: Not on file  Social Connections: Not on file   Family History  Problem Relation Age of Onset  . Breast cancer Mother 84       had hysterectomy at 72  . Skin cancer Brother        had a few removed over lifetime, one was on chest.  Is now 68  . Stomach cancer Maternal Grandmother   . Lung cancer Paternal Grandmother 69       dx. early 92's died 25  . Lung cancer Paternal Grandfather 52       dx in 37's, died at 7  . Prostate cancer Maternal Uncle 70       died shortly after in 53's.  Metastatic- spread to brain and many other locations  . Colon cancer Neg Hx   . Esophageal cancer Neg Hx   . Pancreatic cancer Neg Hx   . Rectal cancer Neg Hx       Review of Systems: Pertinent positive and negative review of systems were noted in the above HPI section. All other review of systems were otherwise negative.   Physical Exam: General: Well developed, well nourished, no acute distress Head: Normocephalic and atraumatic Eyes:  sclerae anicteric, EOMI Ears: Normal auditory acuity Mouth: Not examined, mask on during Covid-19 pandemic Neck: Supple, no masses or thyromegaly Lungs: Clear throughout to auscultation Heart: Regular rate and rhythm; no murmurs, rubs or bruits Abdomen: Soft, non tender and non distended. No masses, hepatosplenomegaly or hernias noted. Normal Bowel sounds Rectal: Not done Musculoskeletal: Symmetrical with no gross deformities  Skin: No lesions on visible extremities Pulses:  Normal pulses noted Extremities: No clubbing, cyanosis, edema or deformities noted Neurological: Alert oriented x 4, grossly nonfocal Cervical Nodes:  No significant cervical adenopathy Inguinal Nodes: No significant inguinal adenopathy Psychological:  Alert and cooperative. Normal mood and affect   Assessment and  Recommendations:  1. Presumed GERD. Left upper back pain is atypical for GERD however it is possible that it was reflux related or musculoskeletal.  Her other symptoms are more typical for GERD, LPR.  Belching might be unrelated to GERD.  She is concerned about remaining on acid suppressing medications.  She would like to further establish a diagnosis.  Follow standard antireflux measures.  Continue pantoprazole 40 mg daily for now.  If she wishes to discontinue pantoprazole advised tapering to every other day for 2 weeks, then every third day for 2  weeks and then discontinue.  Rule out esophagitis, Barrett's.  Schedule EGD. The risks (including bleeding, perforation, infection, missed lesions, medication reactions and possible hospitalization or surgery if complications occur), benefits, and alternatives to endoscopy with possible biopsy and possible dilation were discussed with the patient and they consent to proceed.   2.  Personal history of cystic mucinous neoplasm of the appendix, status post appendectomy in 2007.  Colonoscopy in March 2008 showed internal hemorrhoids and was otherwise unremarkable.  Colonoscopy in October 2018 showed internal hemorrhoids and was otherwise unremarkable.  A 10-year interval screening colonoscopy is recommended in October 2028.   cc: [name], [place],  [place] [address]

## 2020-09-23 NOTE — Patient Instructions (Signed)
Patient advised to avoid spicy, acidic, citrus, chocolate, mints, fruit and fruit juices.  Limit the intake of caffeine, alcohol and Soda.  Don't exercise too soon after eating.  Don't lie down within 3-4 hours of eating.  Elevate the head of your bed.  You have been scheduled for an endoscopy. Please follow written instructions given to you at your visit today. If you use inhalers (even only as needed), please bring them with you on the day of your procedure.   Thank you for choosing me and [facility].  Pricilla Riffle. Dagoberto Ligas., MD., [facility] [place]

## 2020-10-10 ENCOUNTER — Ambulatory Visit (AMBULATORY_SURGERY_CENTER): Payer: 59 | Admitting: Gastroenterology

## 2020-10-10 ENCOUNTER — Other Ambulatory Visit: Payer: Self-pay

## 2020-10-10 ENCOUNTER — Encounter: Payer: Self-pay | Admitting: Gastroenterology

## 2020-10-10 VITALS — BP 132/72 | HR 66 | Temp 97.5°F | Resp 15 | Ht 66.0 in | Wt 167.0 lb

## 2020-10-10 DIAGNOSIS — K2970 Gastritis, unspecified, without bleeding: Secondary | ICD-10-CM

## 2020-10-10 DIAGNOSIS — K219 Gastro-esophageal reflux disease without esophagitis: Secondary | ICD-10-CM

## 2020-10-10 DIAGNOSIS — K319 Disease of stomach and duodenum, unspecified: Secondary | ICD-10-CM | POA: Diagnosis not present

## 2020-10-10 MED ORDER — SODIUM CHLORIDE 0.9 % IV SOLN: 500.0000 mL | Freq: Once | INTRAVENOUS | Status: DC

## 2020-10-10 NOTE — Progress Notes (Signed)
Called to room to assist during endoscopic procedure.  Patient ID and intended procedure confirmed with present staff. Received instructions for my participation in the procedure from the performing physician.  

## 2020-10-10 NOTE — Progress Notes (Signed)
1035 Robinul 0.1 mg IV given due large amount of secretions upon assessment.  MD made aware, vss 

## 2020-10-10 NOTE — Progress Notes (Signed)
Report given to PACU, vss 

## 2020-10-10 NOTE — Progress Notes (Signed)
Medical history reviewed with no changes since PV. VS assessed by C.W 

## 2020-10-10 NOTE — Patient Instructions (Signed)
Handout given ;  Gastritis Resume previous diet Continue current medications Await pathology results Follow antireflux measures: elevate head of bed, try not to eat 2 hours before lying down.  YOU HAD AN ENDOSCOPIC PROCEDURE TODAY AT [facility]:   Refer to the procedure report that was given to you for any specific questions about what was found during the examination.  If the procedure report does not answer your questions, please call your gastroenterologist to clarify.  If you requested that your care partner not be given the details of your procedure findings, then the procedure report has been included in a sealed envelope for you to review at your convenience later.  YOU SHOULD EXPECT: Some feelings of bloating in the abdomen. Passage of more gas than usual.  Walking can help get rid of the air that was put into your GI tract during the procedure and reduce the bloating. If you had a lower endoscopy (such as a colonoscopy or flexible sigmoidoscopy) you may notice spotting of blood in your stool or on the toilet paper. If you underwent a bowel prep for your procedure, you may not have a normal bowel movement for a few days.  Please Note:  You might notice some irritation and congestion in your nose or some drainage.  This is from the oxygen used during your procedure.  There is no need for concern and it should clear up in a day or so.  SYMPTOMS TO REPORT IMMEDIATELY:   Following upper endoscopy (EGD)  Vomiting of blood or coffee ground material  New chest pain or pain under the shoulder blades  Painful or persistently difficult swallowing  New shortness of breath  Fever of 100F or higher  Black, tarry-looking stools  For urgent or emergent issues, a gastroenterologist can be reached at any hour by calling [phone]. Do not use MyChart messaging for urgent concerns.   DIET:  We do recommend a small meal at first, but then you may proceed to your regular diet.   Drink plenty of fluids but you should avoid alcoholic beverages for 24 hours.  ACTIVITY:  You should plan to take it easy for the rest of today and you should NOT DRIVE or use heavy machinery until tomorrow (because of the sedation medicines used during the test).    FOLLOW UP: Our staff will call the number listed on your records 48-72 hours following your procedure to check on you and address any questions or concerns that you may have regarding the information given to you following your procedure. If we do not reach you, we will leave a message.  We will attempt to reach you two times.  During this call, we will ask if you have developed any symptoms of COVID 19. If you develop any symptoms (ie: fever, flu-like symptoms, shortness of breath, cough etc.) before then, please call [phone].  If you test positive for Covid 19 in the 2 weeks post procedure, please call and report this information to [place].    If any biopsies were taken you will be contacted by phone or by letter within the next 1-3 weeks.  Please call us at [phone] if you have not heard about the biopsies in 3 weeks.   SIGNATURES/CONFIDENTIALITY: You and/or your care partner have signed paperwork which will be entered into your electronic medical record.  These signatures attest to the fact that that the information above on your After Visit Summary has been reviewed and is understood.  Full responsibility of the confidentiality of this discharge information lies with you and/or your care-partner.

## 2020-10-10 NOTE — Op Note (Signed)
[facility] Patient Name: [name] Procedure Date: 10/10/2020 10:32 AM MRN: [id number] Endoscopist: Ladene Artist , MD Age: 55 Referring MD:  Date of Birth: [birth year] Gender: Female Account #: [id number] Procedure:                Upper GI endoscopy Indications:              Gastroesophageal reflux disease Medicines:                Monitored Anesthesia Care Procedure:                Pre-Anesthesia Assessment:                           - Prior to the procedure, a History and Physical                            was performed, and patient medications and                            allergies were reviewed. The patient's tolerance of                            previous anesthesia was also reviewed. The risks                            and benefits of the procedure and the sedation                            options and risks were discussed with the patient.                            All questions were answered, and informed consent                            was obtained. Prior Anticoagulants: The patient has                            taken no previous anticoagulant or antiplatelet                            agents. ASA Grade Assessment: II - A patient with                            mild systemic disease. After reviewing the risks                            and benefits, the patient was deemed in                            satisfactory condition to undergo the procedure.                           After obtaining informed consent, the endoscope was  passed under direct vision. Throughout the                            procedure, the patient's blood pressure, pulse, and                            oxygen saturations were monitored continuously. The                            Endoscope was introduced through the mouth, and                            advanced to the second part of duodenum. The upper                            GI endoscopy was  accomplished without difficulty.                            The patient tolerated the procedure well. Scope In: Scope Out: Findings:                 The examined esophagus was normal. Biopsies were                            taken with a cold forceps for histology.                           Diffuse moderate inflammation characterized by                            erythema, friability and granularity was found in                            the entire examined stomach. Biopsies were taken                            with a cold forceps for histology.                           The exam of the stomach was otherwise normal.                           The duodenal bulb and second portion of the                            duodenum were normal. Complications:            No immediate complications. Estimated Blood Loss:     Estimated blood loss was minimal. Impression:               - Normal esophagus. Biopsied.                           - Gastritis. Biopsied.                           -  Normal duodenal bulb and second portion of the                            duodenum. Recommendation:           - Patient has a contact number available for                            emergencies. The signs and symptoms of potential                            delayed complications were discussed with the                            patient. Return to normal activities tomorrow.                            Written discharge instructions were provided to the                            patient.                           - Resume previous diet.                           - Follow antireflux measures.                           - Continue present medications.                           - Await pathology results. Ladene Artist, MD 10/10/2020 10:50:54 AM This report has been signed electronically.

## 2020-10-12 ENCOUNTER — Telehealth: Payer: Self-pay | Admitting: Gastroenterology

## 2020-10-12 NOTE — Telephone Encounter (Signed)
Patient called complaining of localized pain/tenderness on her right side near her rib cage.  Had EGD on Friday, 2/4.  States that it is about a 4 out of 10 on the pain scale.  May be minimal associated nausea, but no vomiting, fever, or chills.  Pain is not worsened by eating.  Procedure looked pretty unremarkable.  Patient given reassurance.  Question if it could be musculoskeletal source.  I told her that I would have Dr. Fuller Plan review and have his nurse reach out to her again tomorrow for any updates.  Certainly she is to call back or proceed to the emergency department if she develops any fever, chills, worsening pain, vomiting, etc.

## 2020-10-13 NOTE — Telephone Encounter (Signed)
Ladene Artist, MD  Algernon Huxley, RN Her EGD was routine so her pain is not procedure related. No findings on EGD that would explain her pain. At her office visit she had pain that seemed to be musculoskeletal. Please recommend that she sees her PCP to evaluate her current pain.    Spoke with pt and she is aware.

## 2020-10-13 NOTE — Telephone Encounter (Signed)
See  my staff message response. Thanks.

## 2020-10-13 NOTE — Telephone Encounter (Signed)
Pt states she is the same as yesterday, reports pain is still a 4 and located in the same place as yesterday. She is able to work, eat and drink fine. She passed gas over the weekend and does not feel bloated at all. Please see note below from [facility] yesterday as well. Please advise.

## 2020-10-14 ENCOUNTER — Telehealth: Payer: Self-pay

## 2020-10-14 NOTE — Telephone Encounter (Signed)
  Follow up Call-  Call back number 10/10/2020  Post procedure Call Back phone  # [phone]  Permission to leave phone message Yes  Some recent data might be hidden     Patient questions:  Do you have a fever, pain , or abdominal swelling? No. Pain Score  0 *  Have you tolerated food without any problems? Yes.    Have you been able to return to your normal activities? Yes.    Do you have any questions about your discharge instructions: Diet   No. Medications  No. Follow up visit  No.  Do you have questions or concerns about your Care? No.  Actions: * If pain score is 4 or above: No action needed, pain <4.  1. Have you developed a fever since your procedure? no  2.   Have you had an respiratory symptoms (SOB or cough) since your procedure? no  3.   Have you tested positive for COVID 19 since your procedure no  4.   Have you had any family members/close contacts diagnosed with the COVID 19 since your procedure?  no   If yes to any of these questions please route to Joylene John, RN and Joella Prince, RN

## 2020-10-23 NOTE — Progress Notes (Signed)
  [place] [address] [place] Phone: [phone] Subjective:   I [name] am serving as a Education administrator for Dr. Hulan Saas.  This visit occurred during the SARS-CoV-2 public health emergency.  Safety protocols were in place, including screening questions prior to the visit, additional usage of staff PPE, and extensive cleaning of exam room while observing appropriate contact time as indicated for disinfecting solutions.   I'm seeing this patient by the request  of:  Waldemar Dickens, MD  CC: Left foot pain follow-up  LNL:[id number]  [name] is a 55 y.o. female coming in with complaint of back and neck pain. OMT 09/02/2020. Also her for PRP left foot, Morton's neuroma. Patient states she is doing well. Orthotics have helped but she still wakes up numb in the morning. Socks still bother her foot at night. Back is not bad. Has been exercising and stretching more.           Reviewed prior external information including notes and imaging from previsou exam, outside providers and external EMR if available.   As well as notes that were available from care everywhere and other healthcare systems.  Past medical history, social, surgical and family history all reviewed in electronic medical record.  No pertanent information unless stated regarding to the chief complaint.   Past Medical History:  Diagnosis Date  . 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 Family history of breast cancer   . Family history of prostate cancer   . Interstitial cystitis    mild  . Neoplasm of appendix   . Thyroid disease     Allergies  Allergen Reactions  . Elemental Sulfur Hives  . Sulfa Antibiotics Hives and Rash    Reaction not listed      Objective  There were no vitals taken for this visit.   General: No apparent distress alert and oriented x3 mood and affect normal, dressed appropriately.  HEENT: Pupils equal,  extraocular movements intact  Respiratory: Patient's speak in full sentences and does not appear short of breath  Cardiovascular: No lower extremity edema, non tender, no erythema   Procedure: Real-time Ultrasound Guided Injection of left foot neuroma Device: GE Logiq Q7 Ultrasound guided injection is preferred based studies that show increased duration, increased effect, greater accuracy, decreased procedural pain, increased response rate, and decreased cost with ultrasound guided versus blind injection.  Verbal informed consent obtained.  Time-out conducted.  Noted no overlying erythema, induration, or other signs of local infection.  Skin prepped in a sterile fashion.  Local anesthesia: Topical Ethyl chloride.  With sterile technique and under real time ultrasound guidance: With a 21-gauge 1-1/2 inch needle patient was injected with 0.5 cc of 0.5% Marcaine and then injected with 3 centrifuge PRP for a total of 3 cc.  Minimal blood loss. Completed without difficulty  Pain immediately improved suggesting accurate placement of the medication.  Advised to call if fevers/chills, erythema, induration, drainage, or persistent bleeding.  Impression: Technically successful ultrasound guided injection.        Assessment and Plan:        The above documentation has been reviewed and is accurate and complete [name]       Note: This dictation was prepared with Dragon dictation along with smaller phrase technology. Any transcriptional errors that result from this process are unintentional.

## 2020-10-24 ENCOUNTER — Other Ambulatory Visit: Payer: Self-pay

## 2020-10-24 ENCOUNTER — Ambulatory Visit (INDEPENDENT_AMBULATORY_CARE_PROVIDER_SITE_OTHER): Payer: 59 | Admitting: Family Medicine

## 2020-10-24 ENCOUNTER — Encounter: Payer: Self-pay | Admitting: Family Medicine

## 2020-10-24 ENCOUNTER — Ambulatory Visit (INDEPENDENT_AMBULATORY_CARE_PROVIDER_SITE_OTHER): Payer: Self-pay | Admitting: Family Medicine

## 2020-10-24 ENCOUNTER — Ambulatory Visit: Payer: Self-pay

## 2020-10-24 VITALS — BP 100/64 | HR 72 | Ht 66.0 in | Wt 166.0 lb

## 2020-10-24 VITALS — Ht 66.0 in

## 2020-10-24 DIAGNOSIS — M999 Biomechanical lesion, unspecified: Secondary | ICD-10-CM

## 2020-10-24 DIAGNOSIS — G8929 Other chronic pain: Secondary | ICD-10-CM

## 2020-10-24 DIAGNOSIS — M545 Low back pain, unspecified: Secondary | ICD-10-CM

## 2020-10-24 DIAGNOSIS — M79672 Pain in left foot: Secondary | ICD-10-CM

## 2020-10-24 DIAGNOSIS — G5762 Lesion of plantar nerve, left lower limb: Secondary | ICD-10-CM

## 2020-10-24 MED ORDER — DOXYCYCLINE HYCLATE 100 MG PO TABS: 100.0000 mg | ORAL_TABLET | Freq: Two times a day (BID) | ORAL | 0 refills | Status: DC

## 2020-10-24 NOTE — Patient Instructions (Addendum)
Good to see you Sent in doxy to be safe only take it if you need it PRP today See me again in 6 weeks

## 2020-10-24 NOTE — Progress Notes (Signed)
[name] Phone: [phone] Subjective:    I'm seeing this patient by the request  of:  Waldemar Dickens, MD  CC: Low back pain follow-up  TDS:[id number]  [name] is a 55 y.o. female coming in with complaint of back and neck pain Patient states overall doing relatively well.  Patient feels like she has been doing better with the range of motion exercises.  Patient states that it does help her significantly patient is also been doing some more mindfulness and feels like this has been helpful as well.  No radiation down the legs.  Patient was seen earlier for the neuroma.          Reviewed prior external information including notes and imaging from previsou exam, outside providers and external EMR if available.   As well as notes that were available from care everywhere and other healthcare systems.  Past medical history, social, surgical and family history all reviewed in electronic medical record.  No pertanent information unless stated regarding to the chief complaint.   Past Medical History:  Diagnosis Date  . 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 Family history of breast cancer   . Family history of prostate cancer   . Interstitial cystitis    mild  . Neoplasm of appendix   . Thyroid disease     Allergies  Allergen Reactions  . Elemental Sulfur Hives  . Sulfa Antibiotics Hives and Rash    Reaction not listed     Review of Systems:  No headache, visual changes, nausea, vomiting, diarrhea, constipation, dizziness, abdominal pain, skin rash, fevers, chills, night sweats, weight loss, swollen lymph nodes, body aches, joint swelling, chest pain, shortness of breath, mood changes. POSITIVE muscle aches but mild  Objective  Height 5\' 6"  (1.676 m).   General: No apparent distress alert and oriented x3 mood and affect normal, dressed appropriately.  HEENT: Pupils equal,  extraocular movements intact  Respiratory: Patient's speak in full sentences and does not appear short of breath  Cardiovascular: No lower extremity edema, non tender, no erythema    Osteopathic findings  C7 flexed rotated and side bent left T3 extended rotated and side bent right inhaled rib T8 extended rotated and side bent left L2 flexed rotated and side bent right Sacrum right on right       Assessment and Plan: Back pain Multifactorial.  Continuing to respond well to osteopathic manipulation.  I do believe that some of it is secondary to core stability.  Patient is going to continue to work on this.  Trying to do the exercises more regularly.  Follow-up again with me in 6 to 12 weeks     Nonallopathic problems  Decision today to treat with OMT was based on Physical Exam  After verbal consent patient was treated with HVLA, ME, FPR techniques in cervical, rib, thoracic, lumbar, and sacral  areas  Patient tolerated the procedure well with improvement in symptoms  Patient given exercises, stretches and lifestyle modifications  See medications in patient instructions if given  Patient will follow up in 4-8 weeks      The above documentation has been reviewed and is accurate and complete Lyndal Pulley, DO       Note: This dictation was prepared with Dragon dictation along with smaller phrase technology. Any transcriptional errors that result from this process are unintentional.

## 2020-10-24 NOTE — Assessment & Plan Note (Signed)
Patient decided she would rather do the PRP instead of the MRI.  Patient has been doing relatively well overall but wanted to have this done because she has had such good response previously.  We discussed avoiding certain barefoot.  Patient given a post PRP injection handout and will increase activity as tolerated.  We discussed if any worsening pain advanced imaging could be warranted but hopefully patient will continue to respond well.  Patient given doxycycline only as needed possibility with me being out of town for the next week.  Patient would not take it if she does not need it.  Patient will follow up again in 6 weeks

## 2020-10-24 NOTE — Assessment & Plan Note (Signed)
Multifactorial.  Continuing to respond well to osteopathic manipulation.  I do believe that some of it is secondary to core stability.  Patient is going to continue to work on this.  Trying to do the exercises more regularly.  Follow-up again with me in 6 to 12 weeks

## 2020-10-29 ENCOUNTER — Encounter: Payer: Self-pay | Admitting: Gastroenterology

## 2020-12-16 NOTE — Progress Notes (Signed)
[name] Phone: [phone] Subjective:   [name], am serving as a scribe for Dr. Hulan Saas. This visit occurred during the SARS-CoV-2 public health emergency.  Safety protocols were in place, including screening questions prior to the visit, additional usage of staff PPE, and extensive cleaning of exam room while observing appropriate contact time as indicated for disinfecting solutions.   I'm seeing this patient by the request  of:  Mary Dickens, MD  CC: Back pain follow-up, foot pain and shoulder pain  GEX:[id number]  [name] is a 55 y.o. female coming in with complaint of back and neck pain. OMT 10/24/2020. L foot PRP as well last visit. Patient states that her pain has improved at night but during the day she has a burning sensation in the top of her foot.   Patient also mentions R shoulder pain for past 3 weeks. Patient sleeps on R side. Pain improves during the day but is worse in mornings and at night. Pain is 2/10.   Medications patient has been prescribed: Doxy  Taking:         Reviewed prior external information including notes and imaging from previsou exam, outside providers and external EMR if available.   As well as notes that were available from care everywhere and other healthcare systems.  Past medical history, social, surgical and family history all reviewed in electronic medical record.  No pertanent information unless stated regarding to the chief complaint.   Past Medical History:  Diagnosis Date  . 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 Family history of breast cancer   . Family history of prostate cancer   . Interstitial cystitis    mild  . Neoplasm of appendix   . Thyroid disease     Allergies  Allergen Reactions  . Elemental Sulfur Hives  . Sulfa Antibiotics Hives and Rash    Reaction not listed     Review of Systems:  No  headache, visual changes, nausea, vomiting, diarrhea, constipation, dizziness, abdominal pain, skin rash, fevers, chills, night sweats, weight loss, swollen lymph nodes, body aches, joint swelling, chest pain, shortness of breath, mood changes. POSITIVE muscle aches  Objective  Blood pressure 118/84, pulse 79, height 5\' 6"  (1.676 m), weight 151 lb (68.5 kg), SpO2 99 %.   General: No apparent distress alert and oriented x3 mood and affect normal, dressed appropriately.  HEENT: Pupils equal, extraocular movements intact  Respiratory: Patient's speak in full sentences and does not appear short of breath  Cardiovascular: No lower extremity edema, non tender, no erythema   Gait normal with good balance and coordination.  MSK: Foot exam patient does have pes planus.  Left foot but does have positive squeeze test.  Pain only between the fourth and fifth metatarsal heads.  No longer the rest of the foot.  No significant swelling noted. Back exam does have some mild loss of lordosis.  Tightness noted in the paraspinal musculature in the parascapular region both on the right side.  5-5 strength of the extremities. Mild tightness with Corky Sox Right shoulder does have very mild positive impingement with Neer and Hawkins.  Negative crossover, negative O'Brien's, 5-5 strength of the rotator cuff  Osteopathic findings  C2 flexed rotated and side bent right C6 flexed rotated and side bent left T3 extended rotated and side bent right inhaled rib T9 extended rotated and side bent left L2 flexed rotated  and side bent right Sacrum right on right    Procedure: Real-time Ultrasound Guided Injection of left foot neuroma  Device: GE Logiq Q7 Ultrasound guided injection is preferred based studies that show increased duration, increased effect, greater accuracy, decreased procedural pain, increased response rate, and decreased cost with ultrasound guided versus blind injection.  Verbal informed consent obtained.   Time-out conducted.  Noted no overlying erythema, induration, or other signs of local infection.  Skin prepped in a sterile fashion.  Local anesthesia: Topical Ethyl chloride.  With sterile technique and under real time ultrasound guidance: With a 25-gauge half inch needle injected with 0.5 cc of 0.5% Marcaine and 0.5 cc of Kenalog 40 mg/mL Completed without difficulty  Pain immediately improved suggesting accurate placement of the medication.  Advised to call if fevers/chills, erythema, induration, drainage, or persistent bleeding.  Impression: Technically successful ultrasound guided injection.     Assessment and Plan:  Morton neuroma, left Injection given today.  Discussed icing regimen and home exercise, which activities to do which wants to avoid.  Increase activity slowly.  Discussed icing regimen.  Follow-up again 6 to 8 weeks patient has responded fairly well to the PRP for the one between the third and fourth metatarsal heads.  Right shoulder pain Patient states 3 to 4 weeks.  On ultrasound only some mild subacromial bursitis noted.  Otherwise fairly unremarkable.  Given home exercises, home discussed icing regimen.  Follow-up again in 6 to 8 weeks    Nonallopathic problems  Decision today to treat with OMT was based on Physical Exam  After verbal consent patient was treated with HVLA, ME, FPR techniques in cervical, rib, thoracic, lumbar, and sacral  areas  Patient tolerated the procedure well with improvement in symptoms  Patient given exercises, stretches and lifestyle modifications  See medications in patient instructions if given  Patient will follow up in 4-8 weeks      The above documentation has been reviewed and is accurate and complete Mary Pulley, DO       Note: This dictation was prepared with Dragon dictation along with smaller phrase technology. Any transcriptional errors that result from this process are unintentional.

## 2020-12-17 ENCOUNTER — Other Ambulatory Visit: Payer: Self-pay

## 2020-12-17 ENCOUNTER — Encounter: Payer: Self-pay | Admitting: Family Medicine

## 2020-12-17 ENCOUNTER — Ambulatory Visit: Payer: Self-pay

## 2020-12-17 ENCOUNTER — Ambulatory Visit: Payer: 59 | Admitting: Family Medicine

## 2020-12-17 VITALS — BP 118/84 | HR 79 | Ht 66.0 in | Wt 151.0 lb

## 2020-12-17 DIAGNOSIS — M9901 Segmental and somatic dysfunction of cervical region: Secondary | ICD-10-CM | POA: Diagnosis not present

## 2020-12-17 DIAGNOSIS — G5762 Lesion of plantar nerve, left lower limb: Secondary | ICD-10-CM | POA: Diagnosis not present

## 2020-12-17 DIAGNOSIS — M9908 Segmental and somatic dysfunction of rib cage: Secondary | ICD-10-CM

## 2020-12-17 DIAGNOSIS — M79672 Pain in left foot: Secondary | ICD-10-CM | POA: Diagnosis not present

## 2020-12-17 DIAGNOSIS — M25511 Pain in right shoulder: Secondary | ICD-10-CM

## 2020-12-17 DIAGNOSIS — M9902 Segmental and somatic dysfunction of thoracic region: Secondary | ICD-10-CM

## 2020-12-17 DIAGNOSIS — M9903 Segmental and somatic dysfunction of lumbar region: Secondary | ICD-10-CM | POA: Diagnosis not present

## 2020-12-17 DIAGNOSIS — M9904 Segmental and somatic dysfunction of sacral region: Secondary | ICD-10-CM

## 2020-12-17 NOTE — Assessment & Plan Note (Signed)
Injection given today.  Discussed icing regimen and home exercise, which activities to do which wants to avoid.  Increase activity slowly.  Discussed icing regimen.  Follow-up again 6 to 8 weeks patient has responded fairly well to the PRP for the one between the third and fourth metatarsal heads.

## 2020-12-17 NOTE — Patient Instructions (Addendum)
Injected dorsum of foot today Exercises for shoulder Ice before bed for 20 min See me again in 6-8 weeks

## 2020-12-17 NOTE — Assessment & Plan Note (Signed)
Patient states 3 to 4 weeks.  On ultrasound only some mild subacromial bursitis noted.  Otherwise fairly unremarkable.  Given home exercises, home discussed icing regimen.  Follow-up again in 6 to 8 weeks

## 2021-02-10 NOTE — Progress Notes (Signed)
[place] Phone: [phone] Subjective:   [name], am serving as a scribe for Dr. Hulan Saas.This visit occurred during the SARS-CoV-2 public health emergency.  Safety protocols were in place, including screening questions prior to the visit, additional usage of staff PPE, and extensive cleaning of exam room while observing appropriate contact time as indicated for disinfecting solutions.  I'm seeing this patient by the request  of:  Waldemar Dickens, MD  CC: back pain follow up, foot pain follow up   [id number]   12/17/2020 Patient states 3 to 4 weeks.  On ultrasound only some mild subacromial bursitis noted.  Otherwise fairly unremarkable.  Given home exercises, home discussed icing regimen.  Follow-up again in 6 to 8 weeks  Injection given today.  Discussed icing regimen and home exercise, which activities to do which wants to avoid.  Increase activity slowly.  Discussed icing regimen.  Follow-up again 6 to 8 weeks patient has responded fairly well to the PRP for the one between the third and fourth metatarsal heads.  Update 02/11/2021 ANNISTYN DEPASS is a 55 y.o. female coming in with complaint of back and neck pain. OMT 12/17/2020. Did have some pain over the weekend with walking in her hip but pain has subsided.   L foot and R shoulder pain. Injected foot last visit for neuroma  Patient states that her foot pain has subsided. Pain in R shoulder continues daily over middle deltoid. No change in pain since last visit.   Medications patient has been prescribed:   Taking:         Reviewed prior external information including notes and imaging from previsou exam, outside providers and external EMR if available.   As well as notes that were available from care everywhere and other healthcare systems.  Past medical history, social, surgical and family history all reviewed in electronic medical record.   No pertanent information unless stated regarding to the chief complaint.   Past Medical History:  Diagnosis Date  . 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 Family history of breast cancer   . Family history of prostate cancer   . Interstitial cystitis    mild  . Neoplasm of appendix   . Thyroid disease     Allergies  Allergen Reactions  . Elemental Sulfur Hives  . Sulfa Antibiotics Hives and Rash    Reaction not listed     Review of Systems:  No headache, visual changes, nausea, vomiting, diarrhea, constipation, dizziness, abdominal pain, skin rash, fevers, chills, night sweats, weight loss, swollen lymph nodes, body aches, joint swelling, chest pain, shortness of breath, mood changes. POSITIVE muscle aches  Objective  Blood pressure 110/78, pulse 72, height 5\' 6"  (1.676 m), weight 152 lb (68.9 kg), SpO2 99 %.   General: No apparent distress alert and oriented x3 mood and affect normal, dressed appropriately.  HEENT: Pupils equal, extraocular movements intact  Respiratory: Patient's speak in full sentences and does not appear short of breath  Cardiovascular: No lower extremity edema, non tender, no erythema  Low back exam shows loss of lordosis, tightness in the paraspinal musculature, neg SLT, tightness with FABER 5/5 strength of lower extremity  Right shoulder still shows the patient does have mild positive impingement.  Patient does have visible Hawkins and Neer's. Full Strength of the rotator cuff noted.  Osteopathic findings  C2 flexed rotated and side bent right C6 flexed  rotated and side bent left T3 extended rotated and side bent right inhaled rib L2 flexed rotated and side bent right Sacrum right on right  Limited musculoskeletal ultrasound was performed and interpreted by Lyndal Pulley  Limited ultrasound of patient's right shoulder shows a very mild hypoechoic changes that is consistent with a very mild bursitis over the supraspinatus.   Patient supraspinatus though tendon appears to be intact.  Acromioclavicular arthritic changes with mild effusion noted. Impression: Mild bursitis with acromioclavicular effusion    Assessment and Plan:  Right shoulder pain Continued right shoulder pain.  Seems to be more secondary to potentially the [facility] joint.  Patient also has some very mild bursitis.  Discussed potential for injection which patient declined.  Patient is to continue with conservative therapy for another 6 weeks and see how she responds or otherwise will consider injection at follow-up.  Back pain Chronic but stable overall.  Patient has been responding fairly well though to osteopathic manipulation.  Continue the over-the-counter medications.  Patient will continue to stay active.  Follow-up with me again in 6 to 8 weeks    Nonallopathic problems  Decision today to treat with OMT was based on Physical Exam  After verbal consent patient was treated with HVLA, ME, FPR techniques in cervical, rib, thoracic, lumbar, and sacral  areas  Patient tolerated the procedure well with improvement in symptoms  Patient given exercises, stretches and lifestyle modifications  See medications in patient instructions if given  Patient will follow up in 4-8 weeks      The above documentation has been reviewed and is accurate and complete Lyndal Pulley, DO       Note: This dictation was prepared with Dragon dictation along with smaller phrase technology. Any transcriptional errors that result from this process are unintentional.

## 2021-02-11 ENCOUNTER — Encounter: Payer: Self-pay | Admitting: Family Medicine

## 2021-02-11 ENCOUNTER — Other Ambulatory Visit: Payer: Self-pay

## 2021-02-11 ENCOUNTER — Ambulatory Visit: Payer: Self-pay

## 2021-02-11 ENCOUNTER — Ambulatory Visit: Payer: 59 | Admitting: Family Medicine

## 2021-02-11 VITALS — BP 110/78 | HR 72 | Ht 66.0 in | Wt 152.0 lb

## 2021-02-11 DIAGNOSIS — M79672 Pain in left foot: Secondary | ICD-10-CM

## 2021-02-11 DIAGNOSIS — M9903 Segmental and somatic dysfunction of lumbar region: Secondary | ICD-10-CM | POA: Diagnosis not present

## 2021-02-11 DIAGNOSIS — M545 Low back pain, unspecified: Secondary | ICD-10-CM | POA: Diagnosis not present

## 2021-02-11 DIAGNOSIS — M25511 Pain in right shoulder: Secondary | ICD-10-CM | POA: Diagnosis not present

## 2021-02-11 DIAGNOSIS — M9908 Segmental and somatic dysfunction of rib cage: Secondary | ICD-10-CM

## 2021-02-11 DIAGNOSIS — M9901 Segmental and somatic dysfunction of cervical region: Secondary | ICD-10-CM | POA: Diagnosis not present

## 2021-02-11 DIAGNOSIS — M9902 Segmental and somatic dysfunction of thoracic region: Secondary | ICD-10-CM

## 2021-02-11 DIAGNOSIS — G8929 Other chronic pain: Secondary | ICD-10-CM

## 2021-02-11 DIAGNOSIS — M9904 Segmental and somatic dysfunction of sacral region: Secondary | ICD-10-CM

## 2021-02-11 NOTE — Patient Instructions (Signed)
Give shoulder 5-6 weeks Ice after activity Pennsaid 2x a day See me in 6 weeks

## 2021-02-11 NOTE — Assessment & Plan Note (Signed)
Continued right shoulder pain.  Seems to be more secondary to potentially the [facility] joint.  Patient also has some very mild bursitis.  Discussed potential for injection which patient declined.  Patient is to continue with conservative therapy for another 6 weeks and see how she responds or otherwise will consider injection at follow-up.

## 2021-02-11 NOTE — Assessment & Plan Note (Signed)
Chronic but stable overall.  Patient has been responding fairly well though to osteopathic manipulation.  Continue the over-the-counter medications.  Patient will continue to stay active.  Follow-up with me again in 6 to 8 weeks

## 2021-03-16 ENCOUNTER — Telehealth: Payer: Self-pay | Admitting: Family Medicine

## 2021-03-16 NOTE — Telephone Encounter (Signed)
Spoke to patient and given Dr Thompson Caul response. She expressed understanding and has been added to the cancellation list.

## 2021-03-16 NOTE — Telephone Encounter (Signed)
Tell her we can put her on cancellation list. Ice 20 minutes every 4 hours. Would do ant inflammatory regular for 2 to 3 days if she can  Worsening pain go to urgent care

## 2021-03-16 NOTE — Telephone Encounter (Signed)
Patient called stating that she did something to her neck and she is now having pain from her neck to her shoulders. She said that it is very painful. Informed patient that Dr Tamala Julian is out of the office until Thursday. Patient does have an appointment with Tamala Julian on 04/01/2021 but she does not know what to do in the mean time. Please advise.

## 2021-03-19 ENCOUNTER — Other Ambulatory Visit: Payer: Self-pay

## 2021-03-19 ENCOUNTER — Encounter: Payer: Self-pay | Admitting: Family Medicine

## 2021-03-19 ENCOUNTER — Ambulatory Visit: Payer: 59 | Admitting: Family Medicine

## 2021-03-19 VITALS — BP 114/82 | HR 70 | Ht 66.0 in | Wt 151.0 lb

## 2021-03-19 DIAGNOSIS — M9902 Segmental and somatic dysfunction of thoracic region: Secondary | ICD-10-CM

## 2021-03-19 DIAGNOSIS — M62838 Other muscle spasm: Secondary | ICD-10-CM

## 2021-03-19 DIAGNOSIS — M9901 Segmental and somatic dysfunction of cervical region: Secondary | ICD-10-CM

## 2021-03-19 DIAGNOSIS — M2550 Pain in unspecified joint: Secondary | ICD-10-CM

## 2021-03-19 DIAGNOSIS — M9908 Segmental and somatic dysfunction of rib cage: Secondary | ICD-10-CM | POA: Diagnosis not present

## 2021-03-19 DIAGNOSIS — M9904 Segmental and somatic dysfunction of sacral region: Secondary | ICD-10-CM

## 2021-03-19 DIAGNOSIS — M9903 Segmental and somatic dysfunction of lumbar region: Secondary | ICD-10-CM | POA: Diagnosis not present

## 2021-03-19 MED ORDER — METHYLPREDNISOLONE ACETATE 80 MG/ML IJ SUSP
80.0000 mg | Freq: Once | INTRAMUSCULAR | Status: AC
Start: 2021-03-19 — End: 2021-03-19
  Administered 2021-03-19: 80 mg via INTRAMUSCULAR

## 2021-03-19 MED ORDER — KETOROLAC TROMETHAMINE 60 MG/2ML IM SOLN
60.0000 mg | Freq: Once | INTRAMUSCULAR | Status: AC
Start: 2021-03-19 — End: 2021-03-19
  Administered 2021-03-19: 60 mg via INTRAMUSCULAR

## 2021-03-19 NOTE — Progress Notes (Signed)
[place] Phone: [phone] Subjective:   Mary [name], am serving as a scribe for Dr. Hulan Saas.  This visit occurred during the SARS-CoV-2 public health emergency.  Safety protocols were in place, including screening questions prior to the visit, additional usage of staff PPE, and extensive cleaning of exam room while observing appropriate contact time as indicated for disinfecting solutions.   I'm seeing this patient by the request  of:  Waldemar Dickens, MD  CC: Neck pain exacerbation  [id number]  02/11/2021 Chronic but stable overall.  Patient has been responding fairly well though to osteopathic manipulation.  Continue the over-the-counter medications.  Patient will continue to stay active.  Follow-up with me again in 6 to 8 weeks  Continued right shoulder pain.  Seems to be more secondary to potentially the [facility] joint.  Patient also has some very mild bursitis.  Discussed potential for injection which patient declined.  Patient is to continue with conservative therapy for another 6 weeks and see how she responds or otherwise will consider injection at follow-up.  Update 03/19/2021 Mary [name] is a 55 y.o. female coming in with complaint of L foot, right shoulder and neck pain. Patient states that her shoulder pain is gone but her neck has been bothering her for past week. Had headaches for one week prior to the neck starting to hurt. Pain in neck is burning and stabbing in character. Had massage and acupuncture on Tuesday. Patient is not sleeping due to pain. Did have adjustment yesterday from chiropractor which gave hr the most relief last night but today her pain has returned. Has been using ice for pain relief. Continues to have headaches intermittently in posterior aspect of her head.       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 [name] chemo   Family  history of breast cancer    Family history of prostate cancer    Interstitial cystitis    mild   Neoplasm of appendix    Thyroid disease    Past Surgical History:  Procedure Laterality Date   APPENDECTOMY  08/2006   BREAST BIOPSY Right    COLONOSCOPY  04/17/2007   TUBAL LIGATION  10/6376   UMBILICAL HERNIA REPAIR  10/2016   Social History   Socioeconomic History   Marital status: Married    Spouse name: Not on file   Number of children: 2   Years of education: Not on file   Highest education level: Not on file  Occupational History   Not on file  Tobacco Use   Smoking status: Never   Smokeless tobacco: Never  Vaping Use   Vaping Use: Never used  Substance and Sexual Activity   Alcohol use: Yes    Alcohol/week: 5.0 standard drinks    Types: 5 Glasses of wine per week    Comment: occasional   Drug use: [name]   Sexual activity: Not on file  Other Topics Concern   Not on file  Social History Narrative   Not on file   Social Determinants of Health   Financial Resource Strain: Not on file  Food Insecurity: Not on file  Transportation Needs: Not on file  Physical Activity: Not on file  Stress: Not on file  Social Connections: Not on file   Allergies  Allergen Reactions   Elemental Sulfur Hives   Sulfa Antibiotics Hives and Rash  Reaction not listed   Family History  Problem Relation Age of Onset   Breast cancer Mother 11       had hysterectomy at 63   Skin cancer Brother        had a few removed over lifetime, one was on chest.  Is now 25   Stomach cancer Maternal Grandmother    Lung cancer Paternal Grandmother 26       dx. early 73's died 44   Lung cancer Paternal Grandfather 25       dx in 5's, died at 41   Prostate cancer Maternal Uncle 91       died shortly after in 3's.  Metastatic- spread to brain and many other locations   Colon cancer Neg Hx    Esophageal cancer Neg Hx    Pancreatic cancer Neg Hx    Rectal cancer Neg Hx     Current  Outpatient Medications (Endocrine & Metabolic):    ARMOUR THYROID PO, Take 60 mg by mouth 2 (two) times daily.    liothyronine (CYTOMEL) 25 MCG tablet, Take 50 mcg by mouth 2 (two) times daily.    progesterone (PROMETRIUM) 200 MG capsule, Take 400 mg by mouth 2 (two) times daily.   Current Outpatient Medications (Respiratory):    fexofenadine (ALLEGRA) 60 MG tablet, Take 60 mg by mouth once.   levocetirizine (XYZAL) 5 MG tablet, Take 5 mg by mouth every evening.   Current Outpatient Medications (Hematological):    Cyanocobalamin (VITAMIN B-12 PO), Take 1 mg by mouth daily.   IRON PO, Take 29 mg by mouth daily.  Current Outpatient Medications (Other):    cholecalciferol (VITAMIN D) 400 units TABS tablet, Take 400 Units by mouth.   doxycycline (VIBRA-TABS) 100 MG tablet, Take 1 tablet (100 mg total) by mouth 2 (two) times daily.   EVENING PRIMROSE OIL PO, Take 1 tablet by mouth daily.   MAGNESIUM CITRATE PO*, Take 1 tablet by mouth daily.   Multiple Vitamins-Minerals (MULTIVITAMIN ADULT PO), Take 1 tablet by mouth daily.   pantoprazole (PROTONIX) 40 MG tablet, Take 40 mg by mouth daily.   Probiotic Product (PROBIOTIC PO), Take 1 tablet by mouth daily.   Vitamin D, Ergocalciferol, (DRISDOL) 50000 units CAPS capsule, TAKE ONE CAPSULE WEEKLY * These medications belong to multiple therapeutic classes and are listed under each applicable group.   Reviewed prior external information including notes and imaging from  primary care provider As well as notes that were available from care everywhere and other healthcare systems.  Past medical history, social, surgical and family history all reviewed in electronic medical record.  [name] pertanent information unless stated regarding to the chief complaint.   Review of Systems:  [name]  visual changes, nausea, vomiting, diarrhea, constipation, dizziness, abdominal pain, skin rash, fevers, chills, night sweats, weight loss, swollen lymph nodes,  joint  swelling, chest pain, shortness of breath, mood changes. POSITIVE muscle aches, headache, body aches  Objective  Blood pressure 114/82, pulse 70, height 5\' 6"  (1.676 m), weight 151 lb (68.5 kg), SpO2 98 %.   General: [name] apparent distress alert and oriented x3 mood and affect normal, dressed appropriately.  HEENT: Pupils equal, extraocular movements intact  Respiratory: Patient's speak in full sentences and does not appear short of breath  Cardiovascular: [name] lower extremity edema, non tender, [name] erythema  Gait normal with good balance and coordination.  MSK: Neck exam does have some mild loss of lordosis.  Tightness noted in the parascapular region.  Right greater than left. Does have decrease in sidebending bilaterally. Significant tightness noted in the parascapular region right greater than left.  Osteopathic findings C 7 flexed rotated and side bent right T5 extended rotated and side bent right with inhaled. L3 flexed rotated and side bent left Sacrum right on right    Impression and Recommendations:     The above documentation has been reviewed and is accurate and complete Lyndal Pulley, DO

## 2021-03-19 NOTE — Assessment & Plan Note (Signed)
Patient is having more of a muscle spasm.  Given injection of Toradol and Depo-Medrol today to decrease ambulation everywhere.  Discussed with icing regimen and home exercises.  Increase activity slowly.  Discussed icing regimen.  Increase activity slowly.  Follow-up again in 4 to 6 weeks.

## 2021-03-19 NOTE — Patient Instructions (Signed)
Injections today Continue exercises Have fun on trip Keep your next appt

## 2021-03-20 ENCOUNTER — Telehealth: Payer: Self-pay | Admitting: Family Medicine

## 2021-03-20 NOTE — Telephone Encounter (Signed)
Pt had neck injections late yesterday. She has been extremely nauseous since about 530 yesterday afternoon. Has not vomiting but the nausea is not relenting. Is this a potential side effect? Any recommendations?  Her neck does feel better.

## 2021-03-20 NOTE — Telephone Encounter (Signed)
Spoke with patient and let her know to go into UC or ED if pain worsens. She voices understanding.

## 2021-03-23 NOTE — Progress Notes (Signed)
[place] Phone: [phone] Subjective:   [name], am serving as a scribe for Dr. Hulan Saas.  This visit occurred during the SARS-CoV-2 public health emergency.  Safety protocols were in place, including screening questions prior to the visit, additional usage of staff PPE, and extensive cleaning of exam room while observing appropriate contact time as indicated for disinfecting solutions.   I'm seeing this patient by the request  of:  Waldemar Dickens, MD  CC: Neck pain follow-up  [id number]  [name] is a 55 y.o. female coming in with complaint of back and neck pain patient was seen previously and given injections with Toradol and Depo-Medrol secondary to the severity of her neck.  Patient states that she is having burning sensation from the top of her head into her neck. Patient states that nausea occurred after injections for one day. Patient took it easy over the weekend but pain still persists. Patient has been taking 2 Aleve in the mornings. Had [name] pain this morning for 2 hours when she woke up.   Medications patient has been prescribed: Aleve is the only thing that does help but tries to avoid taking it regularly.         Reviewed prior external information including notes and imaging from previsou exam, outside providers and external EMR if available.   As well as notes that were available from care everywhere and other healthcare systems.  Past medical history, social, surgical and family history all reviewed in electronic medical record.  [name] pertanent information unless stated regarding to the chief complaint.   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 POSITIVE muscle aches  Objective  Blood pressure 110/88, height 5\' 6"  (1.676 m), weight 152 lb (68.9 kg).   General: [name] apparent distress alert and oriented x3 mood and affect normal, dressed appropriately.  HEENT: Pupils equal, extraocular movements intact  Respiratory: Patient's speak in full sentences and does not appear short of breath  Cardiovascular: [name] lower extremity edema, non tender, [name] erythema  Neck exam shows  Osteopathic findings  C6 flexed rotated and side bent left T3 extended rotated and side bent right inhaled rib T9 extended rotated and side bent left       Assessment and Plan:  Neck muscle spasm Patient continues to have tightness of the neck noted.  This seems to be more muscular in nature the patient is continuing to have difficulty.  After last manipulation and Toradol injection he felt like that will cause some nausea.  Discussed with patient about advanced imaging which patient declined.  Discussed with patient if worsening headaches patient needs to seek medical attention immediately.  Given prednisone.  Patient is seeing [place] again next week for get laboratory work-up to further evaluate as well.  Avoided  HVLA and only did muscle energy on the neck today secondary to tightness   Nonallopathic problems  Decision today to treat with OMT was based on Physical Exam  After verbal consent patient was treated with HVLA, ME, FPR techniques in cervical, rib, thoracic,  areas  Patient tolerated the procedure well with improvement in symptoms  Patient given exercises, stretches and lifestyle  modifications  See medications in patient instructions if given  Patient will follow up in 4-8 weeks      The above documentation has been reviewed and is accurate and complete Lyndal Pulley, DO        Note: This dictation was prepared with Dragon dictation along with smaller phrase technology. Any transcriptional errors that result from this process are unintentional.

## 2021-03-24 ENCOUNTER — Other Ambulatory Visit: Payer: Self-pay

## 2021-03-24 ENCOUNTER — Ambulatory Visit (INDEPENDENT_AMBULATORY_CARE_PROVIDER_SITE_OTHER): Payer: 59

## 2021-03-24 ENCOUNTER — Ambulatory Visit: Payer: 59 | Admitting: Family Medicine

## 2021-03-24 ENCOUNTER — Encounter: Payer: Self-pay | Admitting: Family Medicine

## 2021-03-24 VITALS — BP 110/88 | Ht 66.0 in | Wt 152.0 lb

## 2021-03-24 DIAGNOSIS — M9902 Segmental and somatic dysfunction of thoracic region: Secondary | ICD-10-CM | POA: Diagnosis not present

## 2021-03-24 DIAGNOSIS — M2550 Pain in unspecified joint: Secondary | ICD-10-CM

## 2021-03-24 DIAGNOSIS — M9908 Segmental and somatic dysfunction of rib cage: Secondary | ICD-10-CM | POA: Diagnosis not present

## 2021-03-24 DIAGNOSIS — M62838 Other muscle spasm: Secondary | ICD-10-CM

## 2021-03-24 DIAGNOSIS — M9901 Segmental and somatic dysfunction of cervical region: Secondary | ICD-10-CM | POA: Diagnosis not present

## 2021-03-24 DIAGNOSIS — M542 Cervicalgia: Secondary | ICD-10-CM | POA: Diagnosis not present

## 2021-03-24 LAB — IBC PANEL
Iron: 144 ug/dL (ref 42–145)
Saturation Ratios: 42.5 % (ref 20.0–50.0)
Transferrin: 242 mg/dL (ref 212.0–360.0)

## 2021-03-24 LAB — CBC WITH DIFFERENTIAL/PLATELET
Basophils Absolute: 0.1 10*3/uL (ref 0.0–0.1)
Basophils Relative: 1 % (ref 0.0–3.0)
Eosinophils Absolute: 0.2 10*3/uL (ref 0.0–0.7)
Eosinophils Relative: 1.7 % (ref 0.0–5.0)
HCT: 42.5 % (ref 36.0–46.0)
Hemoglobin: 14.6 g/dL (ref 12.0–15.0)
Lymphocytes Relative: 31.8 % (ref 12.0–46.0)
Lymphs Abs: 3.1 10*3/uL (ref 0.7–4.0)
MCHC: 34.4 g/dL (ref 30.0–36.0)
MCV: 91.8 fl (ref 78.0–100.0)
Monocytes Absolute: 0.7 10*3/uL (ref 0.1–1.0)
Monocytes Relative: 7.1 % (ref 3.0–12.0)
Neutro Abs: 5.8 10*3/uL (ref 1.4–7.7)
Neutrophils Relative %: 58.4 % (ref 43.0–77.0)
Platelets: 221 10*3/uL (ref 150.0–400.0)
RBC: 4.63 Mil/uL (ref 3.87–5.11)
RDW: 13 % (ref 11.5–15.5)
WBC: 9.9 10*3/uL (ref 4.0–10.5)

## 2021-03-24 LAB — COMPREHENSIVE METABOLIC PANEL
ALT: 16 U/L (ref 0–35)
AST: 16 U/L (ref 0–37)
Albumin: 4.5 g/dL (ref 3.5–5.2)
Alkaline Phosphatase: 51 U/L (ref 39–117)
BUN: 18 mg/dL (ref 6–23)
CO2: 28 mEq/L (ref 19–32)
Calcium: 9.4 mg/dL (ref 8.4–10.5)
Chloride: 102 mEq/L (ref 96–112)
Creatinine, Ser: 0.69 mg/dL (ref 0.40–1.20)
GFR: 96.54 mL/min (ref >60.00)
Glucose, Bld: 80 mg/dL (ref 70–99)
Potassium: 3.5 mEq/L (ref 3.5–5.1)
Sodium: 138 mEq/L (ref 135–145)
Total Bilirubin: 1.3 mg/dL — ABNORMAL HIGH (ref 0.2–1.2)
Total Protein: 7 g/dL (ref 6.0–8.3)

## 2021-03-24 LAB — FERRITIN: Ferritin: 51.3 ng/mL (ref 10.0–291.0)

## 2021-03-24 LAB — URIC ACID: Uric Acid, Serum: 4.4 mg/dL (ref 2.4–7.0)

## 2021-03-24 LAB — SEDIMENTATION RATE: Sed Rate: 7 mm/hr (ref 0–30)

## 2021-03-24 MED ORDER — PREDNISONE 20 MG PO TABS: 20.0000 mg | ORAL_TABLET | Freq: Every day | ORAL | 0 refills | Status: DC

## 2021-03-24 NOTE — Patient Instructions (Addendum)
Labs today Xray today Prednisone 20mg  for 7 days Worsening headache with nausea would need to be seen immediately See me again next week

## 2021-03-24 NOTE — Assessment & Plan Note (Signed)
Patient continues to have tightness of the neck noted.  This seems to be more muscular in nature the patient is continuing to have difficulty.  After last manipulation and Toradol injection he felt like that will cause some nausea.  Discussed with patient about advanced imaging which patient declined.  Discussed with patient if worsening headaches patient needs to seek medical attention immediately.  Given prednisone.  Patient is seeing [place] again next week for get laboratory work-up to further evaluate as well.  Avoided HVLA and only did muscle energy on the neck today secondary to tightness

## 2021-03-26 LAB — ANA: Anti Nuclear Antibody (ANA): NEGATIVE

## 2021-03-31 NOTE — Progress Notes (Signed)
[place] Phone: [phone] Subjective:    I'm seeing this patient by the request  of:  Waldemar Dickens, MD  I, Peterson Lombard, LAT, ATC acting as a scribe for Charlann Boxer, DO.  CC: Neck pain follow-up  [id number]  [name] is a 55 y.o. female coming in with complaint of back and neck pain. OMT 03/24/2021. Patient states much improvement in her neck pain over the past 2 weeks. Pt notes she recently got a deep tissue massage and feels that helps. Pt is flying to [place] on Friday to visit her daughter.  Patient with a not having any significant pain at this time but is very aware of the neck still.  Patient has been doing some exercising and feels like it has been doing relatively well as well.  Medications patient has been prescribed: Prednisone- but did not take  Taking:         Reviewed prior external information including notes and imaging from previsou exam, outside providers and external EMR if available.   As well as notes that were available from care everywhere and other healthcare systems.  Past medical history, social, surgical and family history all reviewed in electronic medical record.  No pertanent information unless stated regarding to the chief complaint.   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 POSITIVE muscle aches  Objective  Blood pressure 118/78, pulse 72, height '5\' 6"'$  (1.676 m), weight 150 lb 9.6 oz (68.3 kg), SpO2 99 %.   General: No apparent distress alert and oriented x3 mood and affect normal, dressed appropriately.  HEENT: Pupils equal, extraocular movements intact  Respiratory: Patient's speak in full sentences and does not appear short of breath  Cardiovascular: No lower extremity edema, non tender, no erythema  Neck exam does have some mild loss of lordosis.  Some tenderness to palpation in the paraspinal musculature of the lumbar spine  Osteopathic findings  C2 flexed rotated and side bent right C7 flexed rotated and side bent left T3 extended rotated and side bent right inhaled rib T6 extended rotated and side bent left       Assessment and Plan:  Neck muscle spasm Significant improvement from previous exam.  Discussed icing regimen and home exercises.  Patient was having worsening pain but I do feel that patient should do relatively well with the conservative therapy at this time patient noted does have prednisone, gabapentin as well as Zanaflex for any type of breakthrough pain.  Patient will follow up with me again in 5 to 6 weeks   Nonallopathic problems  Decision today to treat with OMT was based on Physical Exam  After verbal consent patient was treated with HVLA, ME, FPR techniques in cervical, rib, thoracic,  areas  Patient tolerated the procedure well with improvement  in symptoms  Patient given exercises, stretches and lifestyle modifications  See medications in patient instructions if given  Patient will follow up in 4-8 weeks      The above documentation has been reviewed and is accurate and complete Lyndal Pulley, DO        Note: This dictation was prepared with Dragon dictation along with smaller phrase technology. Any transcriptional errors that result from this process are unintentional.

## 2021-04-01 ENCOUNTER — Ambulatory Visit: Payer: 59 | Admitting: Family Medicine

## 2021-04-01 ENCOUNTER — Encounter: Payer: Self-pay | Admitting: Family Medicine

## 2021-04-01 ENCOUNTER — Other Ambulatory Visit: Payer: Self-pay

## 2021-04-01 VITALS — BP 118/78 | HR 72 | Ht 66.0 in | Wt 150.6 lb

## 2021-04-01 DIAGNOSIS — M9902 Segmental and somatic dysfunction of thoracic region: Secondary | ICD-10-CM

## 2021-04-01 DIAGNOSIS — M62838 Other muscle spasm: Secondary | ICD-10-CM | POA: Diagnosis not present

## 2021-04-01 DIAGNOSIS — M9908 Segmental and somatic dysfunction of rib cage: Secondary | ICD-10-CM

## 2021-04-01 DIAGNOSIS — M9901 Segmental and somatic dysfunction of cervical region: Secondary | ICD-10-CM

## 2021-04-01 NOTE — Assessment & Plan Note (Signed)
Significant improvement from previous exam.  Discussed icing regimen and home exercises.  Patient was having worsening pain but I do feel that patient should do relatively well with the conservative therapy at this time patient noted does have prednisone, gabapentin as well as Zanaflex for any type of breakthrough pain.  Patient will follow up with me again in 5 to 6 weeks

## 2021-04-01 NOTE — Patient Instructions (Addendum)
Good to see you  So glad you are doing better.  Have a blast in [place]!  See me again in  5-6

## 2021-04-13 ENCOUNTER — Other Ambulatory Visit: Payer: Self-pay | Admitting: Obstetrics and Gynecology

## 2021-04-13 DIAGNOSIS — Z1231 Encounter for screening mammogram for malignant neoplasm of breast: Secondary | ICD-10-CM

## 2021-04-14 ENCOUNTER — Ambulatory Visit: Payer: 59 | Admitting: Family Medicine

## 2021-04-14 ENCOUNTER — Encounter: Payer: Self-pay | Admitting: Family Medicine

## 2021-04-14 ENCOUNTER — Other Ambulatory Visit: Payer: Self-pay

## 2021-04-14 VITALS — BP 110/70 | HR 78 | Ht 66.0 in | Wt 155.0 lb

## 2021-04-14 DIAGNOSIS — M542 Cervicalgia: Secondary | ICD-10-CM

## 2021-04-14 DIAGNOSIS — M9901 Segmental and somatic dysfunction of cervical region: Secondary | ICD-10-CM

## 2021-04-14 DIAGNOSIS — M9903 Segmental and somatic dysfunction of lumbar region: Secondary | ICD-10-CM | POA: Diagnosis not present

## 2021-04-14 DIAGNOSIS — M9908 Segmental and somatic dysfunction of rib cage: Secondary | ICD-10-CM

## 2021-04-14 DIAGNOSIS — M9902 Segmental and somatic dysfunction of thoracic region: Secondary | ICD-10-CM

## 2021-04-14 DIAGNOSIS — M5030 Other cervical disc degeneration, unspecified cervical region: Secondary | ICD-10-CM | POA: Diagnosis not present

## 2021-04-14 DIAGNOSIS — M9904 Segmental and somatic dysfunction of sacral region: Secondary | ICD-10-CM | POA: Diagnosis not present

## 2021-04-14 NOTE — Patient Instructions (Addendum)
Good to see you  MRI cervical spine ordered [phone] is the number to call and schedule  Can do biking with the knee but lets figure this out first We will be in contact once results are in of the MRI
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
## 2021-04-17 ENCOUNTER — Encounter: Payer: Self-pay | Admitting: Family Medicine

## 2021-04-17 ENCOUNTER — Ambulatory Visit
Admission: RE | Admit: 2021-04-17 | Discharge: 2021-04-17 | Disposition: A | Discharge status: Home / Self Care | Payer: 59 | Source: Ambulatory Visit | Attending: Family Medicine | Admitting: Family Medicine

## 2021-04-17 DIAGNOSIS — M542 Cervicalgia: Secondary | ICD-10-CM

## 2021-05-12 NOTE — Progress Notes (Signed)
[name] [address] [place] Phone: [phone] Subjective:   [name], am serving as a scribe for Dr. Hulan Saas. This visit occurred during the SARS-CoV-2 public health emergency.  Safety protocols were in place, including screening questions prior to the visit, additional usage of staff PPE, and extensive cleaning of exam room while observing appropriate contact time as indicated for disinfecting solutions.   I'm seeing this patient by the request  of:  Waldemar Dickens, MD  CC: back and neck pain   [id number]  [name] is a 55 y.o. female coming in with complaint of back and neck pain. OMT on 04/14/2021. Patient states her neck pain is subsiding. No new complaints. Neck is improving no radiation down the arms anymore.  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 POSITIVE muscle aches but improvement  Objective  Blood pressure 110/74, pulse 68, height '5\' 6"'$  (1.676 m), weight 153 lb (69.4 kg), SpO2 98 %.   General: No apparent distress alert and oriented x3 mood and affect normal, dressed appropriately.  HEENT: Pupils equal, extraocular movements intact  Respiratory: Patient's speak in full sentences and does not  appear short of breath  Cardiovascular: No lower extremity edema, non tender, no erythema   Osteopathic findings  C2 flexed rotated and side bent right C7 flexed rotated and side bent left T3 extended rotated and side bent right inhaled rib T6 extended rotated and side bent left L2 flexed rotated and side bent right Sacrum right on right       Assessment and Plan: Degenerative cervical disc Patient does have some ankylosing of C5-C6 that is contributing to a certain degree as well as some discussed with patient about icing regimen and home exercises.  Increase activity slowly.  Patient's MRI of the neck did not show any true nerve root compression and is improving.  We will continue to monitor.  Follow-up with me again in 6 weeks    Nonallopathic problems  Decision today to treat with OMT was based on Physical Exam  After verbal consent patient was treated with HVLA, ME, FPR techniques in cervical, rib, thoracic, lumbar, and sacral  areas  Patient tolerated the procedure well with improvement in symptoms  Patient given exercises, stretches and lifestyle modifications  See medications in patient instructions if given  Patient will follow up in 4-8 weeks      The above documentation has been reviewed and is accurate and complete Mary Pulley, DO        Note: This dictation was prepared with Dragon dictation along with smaller phrase technology. Any transcriptional errors that result from this process are unintentional.

## 2021-05-13 ENCOUNTER — Encounter: Payer: Self-pay | Admitting: Family Medicine

## 2021-05-13 ENCOUNTER — Other Ambulatory Visit: Payer: Self-pay

## 2021-05-13 ENCOUNTER — Ambulatory Visit: Payer: 59 | Admitting: Family Medicine

## 2021-05-13 VITALS — BP 110/74 | HR 68 | Ht 66.0 in | Wt 153.0 lb

## 2021-05-13 DIAGNOSIS — M5030 Other cervical disc degeneration, unspecified cervical region: Secondary | ICD-10-CM

## 2021-05-13 DIAGNOSIS — M9903 Segmental and somatic dysfunction of lumbar region: Secondary | ICD-10-CM

## 2021-05-13 DIAGNOSIS — M9901 Segmental and somatic dysfunction of cervical region: Secondary | ICD-10-CM | POA: Diagnosis not present

## 2021-05-13 DIAGNOSIS — M9902 Segmental and somatic dysfunction of thoracic region: Secondary | ICD-10-CM | POA: Diagnosis not present

## 2021-05-13 DIAGNOSIS — M9904 Segmental and somatic dysfunction of sacral region: Secondary | ICD-10-CM | POA: Diagnosis not present

## 2021-05-13 DIAGNOSIS — M9908 Segmental and somatic dysfunction of rib cage: Secondary | ICD-10-CM

## 2021-05-13 NOTE — Assessment & Plan Note (Signed)
Patient does have some ankylosing of C5-C6 that is contributing to a certain degree as well as some discussed with patient about icing regimen and home exercises.  Increase activity slowly.  Patient's MRI of the neck did not show any true nerve root compression and is improving.  We will continue to monitor.  Follow-up with me again in 6 weeks

## 2021-05-13 NOTE — Patient Instructions (Signed)
Good to see you Ok to increase activity as tolerated See you again in 5-6 weeks Want to see pictures of grand babies

## 2021-06-03 ENCOUNTER — Ambulatory Visit: Payer: 59

## 2021-06-24 ENCOUNTER — Ambulatory Visit
Admission: RE | Admit: 2021-06-24 | Discharge: 2021-06-24 | Disposition: A | Discharge status: Home / Self Care | Payer: 59 | Source: Ambulatory Visit | Attending: Obstetrics and Gynecology | Admitting: Obstetrics and Gynecology

## 2021-06-24 ENCOUNTER — Other Ambulatory Visit: Payer: Self-pay

## 2021-06-24 DIAGNOSIS — Z1231 Encounter for screening mammogram for malignant neoplasm of breast: Secondary | ICD-10-CM

## 2021-07-01 ENCOUNTER — Ambulatory Visit: Payer: 59 | Admitting: Family Medicine

## 2021-08-07 ENCOUNTER — Ambulatory Visit: Payer: 59 | Admitting: Family Medicine

## 2021-08-18 NOTE — Progress Notes (Signed)
[place] Phone: [phone] Subjective:   [name], am serving as a scribe for Dr. Hulan Saas.  This visit occurred during the SARS-CoV-2 public health emergency.  Safety protocols were in place, including screening questions prior to the visit, additional usage of staff PPE, and extensive cleaning of exam room while observing appropriate contact time as indicated for disinfecting solutions.   I'm seeing this patient by the request  of:  Waldemar Dickens, MD  CC: back and neck pain   [id number]  [name] is a 55 y.o. female coming in with complaint of back and neck pain. OMT 05/13/2021. Patient states that she is experiencing thoracic spine pain that radiates into the lumbar spine. Had massage last week. Had respiratory infection for month of November and was less active.   L foot pain is starting to bother her but states that she is not concerned about symptoms at this time.   Medications patient has been prescribed: None  Taking:         Reviewed prior external information including notes and imaging from previsou exam, outside providers and external EMR if available.   As well as notes that were available from care everywhere and other healthcare systems.  Past medical history, social, surgical and family history all reviewed in electronic medical record.  No pertanent information unless stated regarding to the chief complaint.   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 POSITIVE muscle aches  Objective  Blood pressure 110/82, pulse 80, height 5\' 6"  (1.676 m), weight 161 lb (73 kg), SpO2 98 %.   General: No apparent distress alert and oriented x3 mood and affect normal, dressed appropriately.  HEENT: Pupils equal, extraocular movements intact  Respiratory: Patient's speak in full sentences and does not appear short of breath  Cardiovascular: No lower extremity edema, non tender, no erythema  Neck exam: Neck exam does have tightness noted.  This seems to be in the paraspinal musculature.  Negative Spurling's are noted.  Mild crepitus noted. Tightness in the parascapular region right greater than left. Back exam: Does have some limited range of motion with extension and flexion.  Does have tightness with FABER test.  Osteopathic findings  C2 flexed rotated and side bent right C6 flexed rotated and side bent left T3 extended rotated and side bent right inhaled rib T9 extended rotated and side bent left L2 flexed rotated and side bent right Sacrum right on right       Assessment and Plan:  Degenerative cervical disc Chronic problem with very mild exacerbation.  Patient will has responded extremely well though to osteopathic manipulation.  Has been sometime since we have seen patient.  Discussed icing regimen and home exercises, discussed avoiding certain activities.  Follow-up with me again in 6 to  8 weeks.   Nonallopathic problems  Decision today to treat with OMT was based on Physical Exam  After verbal consent patient was treated with HVLA, ME, FPR techniques in cervical, rib, thoracic, lumbar, and sacral  areas  Patient tolerated the procedure well with improvement in symptoms  Patient given exercises, stretches and lifestyle modifications  See  medications in patient instructions if given  Patient will follow up in 8 weeks      The above documentation has been reviewed and is accurate and complete Lyndal Pulley, DO        Note: This dictation was prepared with Dragon dictation along with smaller phrase technology. Any transcriptional errors that result from this process are unintentional.

## 2021-08-19 ENCOUNTER — Ambulatory Visit: Payer: 59 | Admitting: Family Medicine

## 2021-08-19 ENCOUNTER — Other Ambulatory Visit: Payer: Self-pay

## 2021-08-19 ENCOUNTER — Encounter: Payer: Self-pay | Admitting: Family Medicine

## 2021-08-19 VITALS — BP 110/82 | HR 80 | Ht 66.0 in | Wt 161.0 lb

## 2021-08-19 DIAGNOSIS — M9908 Segmental and somatic dysfunction of rib cage: Secondary | ICD-10-CM

## 2021-08-19 DIAGNOSIS — M9904 Segmental and somatic dysfunction of sacral region: Secondary | ICD-10-CM | POA: Diagnosis not present

## 2021-08-19 DIAGNOSIS — M9901 Segmental and somatic dysfunction of cervical region: Secondary | ICD-10-CM

## 2021-08-19 DIAGNOSIS — M5030 Other cervical disc degeneration, unspecified cervical region: Secondary | ICD-10-CM | POA: Diagnosis not present

## 2021-08-19 DIAGNOSIS — M9903 Segmental and somatic dysfunction of lumbar region: Secondary | ICD-10-CM

## 2021-08-19 DIAGNOSIS — M9902 Segmental and somatic dysfunction of thoracic region: Secondary | ICD-10-CM | POA: Diagnosis not present

## 2021-08-19 NOTE — Assessment & Plan Note (Signed)
Chronic problem with very mild exacerbation.  Patient will has responded extremely well though to osteopathic manipulation.  Has been sometime since we have seen patient.  Discussed icing regimen and home exercises, discussed avoiding certain activities.  Follow-up with me again in 6 to 8 weeks.

## 2021-08-19 NOTE — Patient Instructions (Signed)
Thanks for sharing pics See me in 2 months Happy Holidays

## 2021-09-15 NOTE — Progress Notes (Signed)
[name] [address] [place] Phone: [phone] Subjective:   [name], am serving as a scribe for Dr. Hulan Saas. This visit occurred during the SARS-CoV-2 public health emergency.  Safety protocols were in place, including screening questions prior to the visit, additional usage of staff PPE, and extensive cleaning of exam room while observing appropriate contact time as indicated for disinfecting solutions.   I'm seeing this patient by the request  of:  Waldemar Dickens, MD  CC: Neck and back pain  [id number]  [name] is a 56 y.o. female coming in with complaint of back and neck pain. OMT on 08/19/2021. Patient states hips are off. Right thigh tender spot. No other complaints.  Patient denies any fevers, chills, any abnormal weight loss.  Medications patient has been prescribed: None  Taking:         Reviewed prior external information including notes and imaging from previsou exam, outside providers and external EMR if available.   As well as notes that were available from care everywhere and other healthcare systems.  Past medical history, social, surgical and family history all reviewed in electronic medical record.  No pertanent information unless stated regarding to the chief complaint.   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 POSITIVE muscle aches  Objective  Blood pressure 118/72, pulse 94, height 5\' 6"  (1.676 m), weight 163 lb (73.9 kg), SpO2 97 %.   General: No apparent distress alert and oriented x3 mood and affect normal, dressed appropriately.  HEENT: Pupils equal, extraocular movements intact  Respiratory: Patient's speak in full sentences and does not appear short of breath  Low back exam does have some mild loss of lordosis.  Patient noted does have an anterior leg and even better think is contributing.  Pelvic shear noted.  Tender to palpation in the sacroiliac joints bilaterally.  Osteopathic findings  C2 flexed rotated and side bent right C5 flexed rotated and side bent left T3 extended rotated and side bent right inhaled rib T9 extended rotated and side bent left L2 flexed rotated and side bent right Sacrum right on right Anterior right ilium.      Assessment and Plan: Back pain Mild increase in back pain.  Discussed icing regimen and home exercises, discussed which activities to do and which ones to avoid.  Increase activity slowly.  Discussed with patient about continuing the good shoes.  Potential changes that I think will be beneficial.  Follow-up with me again in 3 to 4 weeks    Nonallopathic problems  Decision today to treat with OMT was based on Physical Exam  After verbal consent patient was treated with HVLA, ME, FPR techniques in cervical, rib, thoracic, lumbar, and sacral and pelvis areas  Patient tolerated the procedure well with improvement in symptoms  Patient  given exercises, stretches and lifestyle modifications  See medications in patient instructions if given  Patient will follow up in 4 weeks     The above documentation has been reviewed and is accurate and complete Lyndal Pulley, DO        Note: This dictation was prepared with Dragon dictation along with smaller phrase technology. Any transcriptional errors that result from this  process are unintentional.

## 2021-09-16 ENCOUNTER — Other Ambulatory Visit: Payer: Self-pay

## 2021-09-16 ENCOUNTER — Ambulatory Visit: Payer: 59 | Admitting: Family Medicine

## 2021-09-16 VITALS — BP 118/72 | HR 94 | Ht 66.0 in | Wt 163.0 lb

## 2021-09-16 DIAGNOSIS — M9901 Segmental and somatic dysfunction of cervical region: Secondary | ICD-10-CM | POA: Diagnosis not present

## 2021-09-16 DIAGNOSIS — M9902 Segmental and somatic dysfunction of thoracic region: Secondary | ICD-10-CM

## 2021-09-16 DIAGNOSIS — M545 Low back pain, unspecified: Secondary | ICD-10-CM

## 2021-09-16 DIAGNOSIS — M9908 Segmental and somatic dysfunction of rib cage: Secondary | ICD-10-CM

## 2021-09-16 DIAGNOSIS — M9904 Segmental and somatic dysfunction of sacral region: Secondary | ICD-10-CM

## 2021-09-16 DIAGNOSIS — G8929 Other chronic pain: Secondary | ICD-10-CM

## 2021-09-16 DIAGNOSIS — M9905 Segmental and somatic dysfunction of pelvic region: Secondary | ICD-10-CM | POA: Diagnosis not present

## 2021-09-16 DIAGNOSIS — M9903 Segmental and somatic dysfunction of lumbar region: Secondary | ICD-10-CM

## 2021-09-16 NOTE — Assessment & Plan Note (Signed)
Mild increase in back pain.  Discussed icing regimen and home exercises, discussed which activities to do and which ones to avoid.  Increase activity slowly.  Discussed with patient about continuing the good shoes.  Potential changes that I think will be beneficial.  Follow-up with me again in 3 to 4 weeks

## 2021-09-16 NOTE — Patient Instructions (Signed)
Watch the likely lipoma If tender and movable a good sign Try to do more strength if possible than ROM Continue the good shoes See me again as scheduled

## 2021-10-16 NOTE — Progress Notes (Signed)
[place] Phone: [phone] Subjective:   [name], am serving as a scribe for Dr. Hulan Saas.  This visit occurred during the SARS-CoV-2 public health emergency.  Safety protocols were in place, including screening questions prior to the visit, additional usage of staff PPE, and extensive cleaning of exam room while observing appropriate contact time as indicated for disinfecting solutions.   I'm seeing this patient by the request  of:  Mary Dickens, MD  CC: back pain and foot pain   [id number]  [name] is a 56 y.o. female coming in with complaint of back and neck pain. OMT 09/16/2020. Patient states that she is out of alignment as her L hip and R knee are bothering her. Overall she feels like she has improved.   Medications patient has been prescribed: None  Taking:         Reviewed prior external information including notes and imaging from previsou exam, outside providers and external EMR if available.   As well as notes that were available from care everywhere and other healthcare systems.  Past medical history, social, surgical and family history all reviewed in electronic medical record.  [name] pertanent information unless stated regarding to the chief complaint.   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 POSITIVE muscle aches  Objective  Blood pressure 110/76, pulse 72, height 5\' 6"  (1.676 m), weight 167 lb (75.8 kg), SpO2 98 %.   General: [name] apparent distress alert and oriented x3 mood and affect normal, dressed appropriately.  HEENT: Pupils equal, extraocular movements intact  Respiratory: Patient's speak in full sentences and does not appear short of breath  Cardiovascular: [name] lower extremity edema, non tender, [name] erythema  Low back exam does have some loss of lordosis.  Overpronation noted of the hindfoot bilaterally.  Lacks last 5 degrees of extension.  Tightness minorly with  Osteopathic findings \ C6 flexed rotated and side bent left T3 extended rotated and side bent right inhaled rib T9 extended rotated and side bent left L2 flexed rotated and side bent right Sacrum right on right       Assessment and Plan: Back pain Chronic, mild exacerbation.  Continues to respond well though to osteopathic manipulation.  Patient has been traveling a lot and will continue to travel we will discuss continuing the same management at this time.  Worsening pain we have discussed different things such as medications and injections.  Follow-up again in 4 weeks    Nonallopathic problems  Decision today to treat with OMT was based on Physical Exam  After verbal consent patient was treated with HVLA, ME, FPR techniques in cervical, rib, thoracic, lumbar, and sacral  areas  Patient tolerated the procedure well with improvement in symptoms  Patient given exercises, stretches and lifestyle  modifications  See medications in patient instructions if given  Patient will follow up in 4-8 weeks      The above documentation has been reviewed and is accurate and complete Lyndal Pulley, DO        Note: This dictation was prepared with Dragon dictation along with smaller phrase technology. Any transcriptional errors that result from this process are unintentional.

## 2021-10-19 ENCOUNTER — Ambulatory Visit: Payer: 59 | Admitting: Family Medicine

## 2021-10-19 ENCOUNTER — Other Ambulatory Visit: Payer: Self-pay

## 2021-10-19 ENCOUNTER — Encounter: Payer: Self-pay | Admitting: Family Medicine

## 2021-10-19 VITALS — BP 110/76 | HR 72 | Ht 66.0 in | Wt 167.0 lb

## 2021-10-19 DIAGNOSIS — M545 Low back pain, unspecified: Secondary | ICD-10-CM

## 2021-10-19 DIAGNOSIS — M9901 Segmental and somatic dysfunction of cervical region: Secondary | ICD-10-CM

## 2021-10-19 DIAGNOSIS — M9903 Segmental and somatic dysfunction of lumbar region: Secondary | ICD-10-CM | POA: Diagnosis not present

## 2021-10-19 DIAGNOSIS — M9904 Segmental and somatic dysfunction of sacral region: Secondary | ICD-10-CM

## 2021-10-19 DIAGNOSIS — M9902 Segmental and somatic dysfunction of thoracic region: Secondary | ICD-10-CM | POA: Diagnosis not present

## 2021-10-19 DIAGNOSIS — G8929 Other chronic pain: Secondary | ICD-10-CM

## 2021-10-19 DIAGNOSIS — M9908 Segmental and somatic dysfunction of rib cage: Secondary | ICD-10-CM

## 2021-10-19 NOTE — Assessment & Plan Note (Signed)
Chronic, mild exacerbation.  Continues to respond well though to osteopathic manipulation.  Patient has been traveling a lot and will continue to travel we will discuss continuing the same management at this time.  Worsening pain we have discussed different things such as medications and injections.  Follow-up again in 4 weeks

## 2021-10-19 NOTE — Patient Instructions (Signed)
Great to see you Sorry for being late Try to keep routine See me again in 4 weeks

## 2021-11-19 NOTE — Progress Notes (Signed)
?Charlann Boxer D.O. ?[facility] ?[place] ?Phone: [phone] ?Subjective:   ?I, [name], am serving as a scribe for Dr. Hulan Saas. ? ?This visit occurred during the SARS-CoV-2 public health emergency.  Safety protocols were in place, including screening questions prior to the visit, additional usage of staff PPE, and extensive cleaning of exam room while observing appropriate contact time as indicated for disinfecting solutions.  ? ? ?I'm seeing this patient by the request  of:  Waldemar Dickens, MD ? ?CC: Back and neck pain follow-up ? ?[id number]  ?[name] is a 56 y.o. female coming in with complaint of back and neck pain. OTM 10/19/2021. Patient states that she has had a headache over 1 week in superior aspect of head. Denies any neck pain. Otherwise back is doing well.  ? ?Medications patient has been prescribed: None ? ?Taking: ? ? ?  ? ? ? ? ?Reviewed prior external information including notes and imaging from previsou exam, outside providers and external EMR if available.  ? ?As well as notes that were available from care everywhere and other healthcare systems. ? ?Past medical history, social, surgical and family history all reviewed in electronic medical record.  No pertanent information unless stated regarding to the chief complaint.  ? ?Past Medical History:  ?Diagnosis Date  ? Cancer [facility]) 08/2006  ? 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 Family history of breast cancer   ? Family history of prostate cancer   ? Interstitial cystitis   ? mild  ? Neoplasm of appendix   ? Thyroid disease   ?  ?Allergies  ?Allergen Reactions  ? Elemental Sulfur Hives  ? Sulfa Antibiotics Hives and Rash  ?  Reaction not listed  ? ? ? ?Review of Systems: ? No headache, visual changes, nausea, vomiting, diarrhea, constipation, dizziness, abdominal pain, skin rash, fevers, chills, night sweats, weight loss, swollen lymph nodes, body aches, joint swelling, chest pain,  shortness of breath, mood changes. POSITIVE muscle aches ? ?Objective  ?Blood pressure 118/82, pulse 71, height '5\' 6"'$  (1.676 m), weight 167 lb (75.8 kg), SpO2 98 %. ?  ?General: No apparent distress alert and oriented x3 mood and affect normal, dressed appropriately.  ?HEENT: Pupils equal, extraocular movements intact  ?Respiratory: Patient's speak in full sentences and does not appear short of breath  ?Cardiovascular: No lower extremity edema, non tender, no erythema  ?Neck exam does have significant increase in tightness from previous exam lacks last 5 degrees of flexion neck.  Limited sidebending bilaterally as well secondary to the tightness.  Negative Spurling's.  5 of 5 strength of the upper extremities ? ?Osteopathic findings ? ?C5 flexed rotated and side bent right ?C7 flexed rotated and side bent left ?T4 extended rotated and side bent right inhaled rib ?T9 extended rotated and side bent left ?L2 flexed rotated and side bent right ?Sacrum right on right ? ? ? ? ?  ?Assessment and Plan: ? ?Degenerative cervical disc ?Known degenerative disc.  Increase in muscle tension noted.  Discussed icing regimen and home exercises.  Discussed which activities to do and which ones to avoid.  Increase activity slowly.  Increase activity slowly.  Patient given a refill of the Zanaflex.  Will be traveling and given some hydroxyzine to try as well to take on the plane.  Warned of potential side effects.  Follow-up with me again in 6 to 8 weeks  ? ?Nonallopathic problems ? ?Decision today to treat with  OMT was based on Physical Exam ? ?After verbal consent patient was treated with HVLA, ME, FPR techniques in cervical, rib, thoracic, lumbar, and sacral  areas ? ?Patient tolerated the procedure well with improvement in symptoms ? ?Patient given exercises, stretches and lifestyle modifications ? ?See medications in patient instructions if given ? ?Patient will follow up in 4-8 weeks ? ?  ? ? ?The above documentation has been  reviewed and is accurate and complete Lyndal Pulley, DO ? ? ? ?  ? ? Note: This dictation was prepared with Dragon dictation along with smaller phrase technology. Any transcriptional errors that result from this process are unintentional.    ?  ?  ? ?

## 2021-11-25 ENCOUNTER — Ambulatory Visit: Payer: 59 | Admitting: Family Medicine

## 2021-11-25 ENCOUNTER — Encounter: Payer: Self-pay | Admitting: Family Medicine

## 2021-11-25 ENCOUNTER — Other Ambulatory Visit: Payer: Self-pay

## 2021-11-25 VITALS — BP 118/82 | HR 71 | Ht 66.0 in | Wt 167.0 lb

## 2021-11-25 DIAGNOSIS — M9904 Segmental and somatic dysfunction of sacral region: Secondary | ICD-10-CM | POA: Diagnosis not present

## 2021-11-25 DIAGNOSIS — M9903 Segmental and somatic dysfunction of lumbar region: Secondary | ICD-10-CM | POA: Diagnosis not present

## 2021-11-25 DIAGNOSIS — M5030 Other cervical disc degeneration, unspecified cervical region: Secondary | ICD-10-CM

## 2021-11-25 DIAGNOSIS — M9902 Segmental and somatic dysfunction of thoracic region: Secondary | ICD-10-CM | POA: Diagnosis not present

## 2021-11-25 DIAGNOSIS — M9908 Segmental and somatic dysfunction of rib cage: Secondary | ICD-10-CM

## 2021-11-25 DIAGNOSIS — M9901 Segmental and somatic dysfunction of cervical region: Secondary | ICD-10-CM | POA: Diagnosis not present

## 2021-11-25 MED ORDER — TIZANIDINE HCL 2 MG PO TABS: 2.0000 mg | ORAL_TABLET | Freq: Every day | ORAL | 0 refills | Status: DC

## 2021-11-25 MED ORDER — HYDROXYZINE HCL 10 MG PO TABS: 10.0000 mg | ORAL_TABLET | Freq: Three times a day (TID) | ORAL | 0 refills | Status: DC | PRN

## 2021-11-25 NOTE — Patient Instructions (Signed)
Good to see you ?Zanaflex '2mg'$  at night ?Hydroxizine take on plane ?See me in May  ?

## 2021-11-25 NOTE — Assessment & Plan Note (Signed)
Known degenerative disc.  Increase in muscle tension noted.  Discussed icing regimen and home exercises.  Discussed which activities to do and which ones to avoid.  Increase activity slowly.  Increase activity slowly.  Patient given a refill of the Zanaflex.  Will be traveling and given some hydroxyzine to try as well to take on the plane.  Warned of potential side effects.  Follow-up with me again in 6 to 8 weeks ?

## 2021-11-26 ENCOUNTER — Other Ambulatory Visit: Payer: Self-pay | Admitting: Family Medicine

## 2021-11-27 ENCOUNTER — Other Ambulatory Visit: Payer: Self-pay

## 2021-11-27 MED ORDER — TIZANIDINE HCL 2 MG PO TABS: 2.0000 mg | ORAL_TABLET | Freq: Every day | ORAL | 1 refills | Status: DC

## 2021-12-10 NOTE — Progress Notes (Signed)
?Charlann Boxer D.O. ?[facility] ?[place] ?Phone: [phone] ?Subjective:   ? ?I'm seeing this patient by the request  of:  Waldemar Dickens, MD ? ?CC: Back and neck pain follow-up ? ?[id number]  ?[name] is a 56 y.o. female coming in with complaint of back and neck pain. OMT 11/25/2021. Patient states that she continues to have headache since last visit. Notes tight traps. Has seen massage therapist 3x since lat visit. Patient notes a pulling sensation on top of head. Symptoms are worse in the mornings.  ? ?Medications patient has been prescribed: Hydroxizine, Zanaflex ? ? ?  ? ? ?Reviewed prior external information including notes and imaging from previsou exam, outside providers and external EMR if available.  ? ?As well as notes that were available from care everywhere and other healthcare systems. ? ?Past medical history, social, surgical and family history all reviewed in electronic medical record.  No pertanent information unless stated regarding to the chief complaint.  ? ?Past Medical History:  ?Diagnosis Date  ? 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 Family history of breast cancer   ? Family history of prostate cancer   ? Interstitial cystitis   ? mild  ? Neoplasm of appendix   ? Thyroid disease   ?  ?Allergies  ?Allergen Reactions  ? Elemental Sulfur Hives  ? Sulfa Antibiotics Hives and Rash  ?  Reaction not listed  ? ? ? ?Review of Systems: ? No headache, visual changes, nausea, vomiting, diarrhea, constipation, dizziness, abdominal pain, skin rash, fevers, chills, night sweats, weight loss, swollen lymph nodes, body aches, joint swelling, chest pain, shortness of breath, mood changes. POSITIVE muscle aches ? ?Objective  ?Blood pressure 114/74, height '5\' 6"'$  (1.676 m), weight 166 lb (75.3 kg). ?  ?General: No apparent distress alert and oriented x3 mood and affect normal, dressed appropriately.  ?HEENT: Pupils equal, extraocular  movements intact  ?Respiratory: Patient's speak in full sentences and does not appear short of breath  ?Cardiovascular: No lower extremity edema, non tender, no erythema  ?Neck exam does show some increase in tightness noted.  Negative Spurling's.  Lacks the last 5 degrees of extension in the last 10 degrees of flexion.  Limited sidebending bilaterally.  5/5 strength of the upper extremities. ? ?Osteopathic findings ? ?C2 flexed rotated and side bent right ?C3 flexed rotated and side bent left ?T3 extended rotated and side bent right inhaled rib ?T6 extended rotated and side bent left inhaled rib ?L2 flexed rotated and side bent right ?Sacrum right on right ? ? ? ? ?  ?Assessment and Plan: ? ?Degenerative cervical disc ?Chronic, with mild exacerbation.  Patient did have increasing discomfort.  Toradol and Depo-Medrol given today.  Patient has had this in the past.  Has not given prednisone because patient will be traveling internationally.  Patient knows to watch for any visual changes, nausea or vomiting, any weakness to any of the extremities and seek medical attention immediately.  Patient will be following up with me again in 3 to 4 weeks.  ? ?Nonallopathic problems ? ?Decision today to treat with OMT was based on Physical Exam ? ?After verbal consent patient was treated with HVLA, ME, FPR techniques in cervical, rib, thoracic, lumbar, and sacral  areas ? ?Patient tolerated the procedure well with improvement in symptoms ? ?Patient given exercises, stretches and lifestyle modifications ? ?See medications in patient instructions if given ? ?Patient will follow  up in 4-8 weeks ? ?  ? ? ?The above documentation has been reviewed and is accurate and complete Lyndal Pulley, DO ? ? ? ?  ? ? Note: This dictation was prepared with Dragon dictation along with smaller phrase technology. Any transcriptional errors that result from this process are unintentional.    ?  ?  ? ?

## 2021-12-15 ENCOUNTER — Encounter: Payer: Self-pay | Admitting: Family Medicine

## 2021-12-15 ENCOUNTER — Ambulatory Visit: Payer: 59 | Admitting: Family Medicine

## 2021-12-15 VITALS — BP 114/74 | Ht 66.0 in | Wt 166.0 lb

## 2021-12-15 DIAGNOSIS — M9904 Segmental and somatic dysfunction of sacral region: Secondary | ICD-10-CM

## 2021-12-15 DIAGNOSIS — M9908 Segmental and somatic dysfunction of rib cage: Secondary | ICD-10-CM

## 2021-12-15 DIAGNOSIS — M9903 Segmental and somatic dysfunction of lumbar region: Secondary | ICD-10-CM

## 2021-12-15 DIAGNOSIS — M9902 Segmental and somatic dysfunction of thoracic region: Secondary | ICD-10-CM | POA: Diagnosis not present

## 2021-12-15 DIAGNOSIS — M5030 Other cervical disc degeneration, unspecified cervical region: Secondary | ICD-10-CM | POA: Diagnosis not present

## 2021-12-15 DIAGNOSIS — M9901 Segmental and somatic dysfunction of cervical region: Secondary | ICD-10-CM

## 2021-12-15 MED ORDER — KETOROLAC TROMETHAMINE 30 MG/ML IJ SOLN
30.0000 mg | Freq: Once | INTRAMUSCULAR | Status: AC
  Administered 2021-12-15: 30 mg via INTRAMUSCULAR

## 2021-12-15 MED ORDER — METHYLPREDNISOLONE ACETATE 40 MG/ML IJ SUSP
40.0000 mg | Freq: Once | INTRAMUSCULAR | Status: AC
  Administered 2021-12-15: 40 mg via INTRAMUSCULAR

## 2021-12-15 MED ORDER — PREDNISONE 20 MG PO TABS: 20.0000 mg | ORAL_TABLET | Freq: Every day | ORAL | 0 refills | Status: DC

## 2021-12-15 NOTE — Assessment & Plan Note (Signed)
Chronic, with mild exacerbation.  Patient did have increasing discomfort.  Toradol and Depo-Medrol given today.  Patient has had this in the past.  Has not given prednisone because patient will be traveling internationally.  Patient knows to watch for any visual changes, nausea or vomiting, any weakness to any of the extremities and seek medical attention immediately.  Patient will be following up with me again in 3 to 4 weeks. ?

## 2021-12-15 NOTE — Patient Instructions (Signed)
Prednisone '20mg'$  for 5 days if needed ?Have a great time ?Tell Peterson Ao to keep up ?See me as scheduled ?

## 2021-12-18 ENCOUNTER — Other Ambulatory Visit: Payer: Self-pay | Admitting: Obstetrics and Gynecology

## 2021-12-18 DIAGNOSIS — Z1231 Encounter for screening mammogram for malignant neoplasm of breast: Secondary | ICD-10-CM

## 2021-12-30 NOTE — Progress Notes (Signed)
?[name] D.O. ?[facility] ?[place] ?Phone: [phone] ?Subjective:   ?I, [name], am serving as a Education administrator for Dr. Hulan Saas. ?This visit occurred during the SARS-CoV-2 public health emergency.  Safety protocols were in place, including screening questions prior to the visit, additional usage of staff PPE, and extensive cleaning of exam room while observing appropriate contact time as indicated for disinfecting solutions.  ? ?I'm seeing this patient by the request  of:  Waldemar Dickens, MD ? ?CC: Neck and back pain follow-up ? ?[id number]  ?[name] is a 56 y.o. female coming in with complaint of back and neck pain. OMT 12/15/2021. Patient states neck is much better. Left side is a little tense. Traveling for 2 weeks. No new complaints. ? ?Medications patient has been prescribed: Prednisone, Zanaflex, Hydroxizine ? ?Taking: ? ? ?  ? ? ? ? ?Reviewed prior external information including notes and imaging from previsou exam, outside providers and external EMR if available.  ? ?As well as notes that were available from care everywhere and other healthcare systems. ? ?Past medical history, social, surgical and family history all reviewed in electronic medical record.  No pertanent information unless stated regarding to the chief complaint.  ? ?Past Medical History:  ?Diagnosis Date  ? 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 Family history of breast cancer   ? Family history of prostate cancer   ? Interstitial cystitis   ? mild  ? Neoplasm of appendix   ? Thyroid disease   ?  ?Allergies  ?Allergen Reactions  ? Elemental Sulfur Hives  ? Sulfa Antibiotics Hives and Rash  ?  Reaction not listed  ? ? ? ?Review of Systems: ? No headache, visual changes, nausea, vomiting, diarrhea, constipation, dizziness, abdominal pain, skin rash, fevers, chills, night sweats, weight loss, swollen lymph nodes, body aches, joint swelling, chest pain,  shortness of breath, mood changes. POSITIVE muscle aches ? ?Objective  ?Blood pressure 134/90, pulse 78, height '5\' 6"'$  (1.676 m), weight 170 lb (77.1 kg), SpO2 95 %. ?  ?General: No apparent distress alert and oriented x3 mood and affect normal, dressed appropriately.  ?HEENT: Pupils equal, extraocular movements intact  ?Respiratory: Patient's speak in full sentences and does not appear short of breath  ?Cardiovascular: No lower extremity edema, non tender, no erythema  ?Gait normal with good balance and coordination.  ?MSK:  Non tender with full range of motion and good stability and symmetric strength and tone of shoulders, elbows, wrist, hip, knee and ankles bilaterally.  ?Neck exam does have significant tightness noted in the paraspinal musculature mostly on the left side.  Negative Spurling's though noted.  5 out of 5 strength of the upper extremities ? ?Osteopathic findings ? ?C2 flexed rotated and side bent left ?C7 flexed rotated and side bent left ?T3 extended rotated and side bent left inhaled rib ?T6 extended rotated and side bent left ?L2 flexed rotated and side bent right ?Sacrum right on right ? ? ? ? ?  ?Assessment and Plan: ? ?Degenerative cervical disc ?Chronic, with mild exacerbation, patient did travel recently and did have mild tenderness noted on the left side of the neck again.  Responded extremely well to osteopathic manipulation.  Discussed icing regimen and home exercises, discussed which activities to do and which ones to avoid, increase activity slowly.  Follow-up again in 6 to 8 weeks ? ? ?Nonallopathic problems ? ?Decision today to treat with  OMT was based on Physical Exam ? ?After verbal consent patient was treated with HVLA, ME, FPR techniques in cervical, rib, thoracic, lumbar, and sacral  areas ? ?Patient tolerated the procedure well with improvement in symptoms ? ?Patient given exercises, stretches and lifestyle modifications ? ?See medications in patient instructions if  given ? ?Patient will follow up in 4-8 weeks ? ?  ? ? ?The above documentation has been reviewed and is accurate and complete Lyndal Pulley, DO ? ? ? ?  ? ? Note: This dictation was prepared with Dragon dictation along with smaller phrase technology. Any transcriptional errors that result from this process are unintentional.    ?  ?  ? ?

## 2022-01-11 ENCOUNTER — Ambulatory Visit: Payer: 59 | Admitting: Family Medicine

## 2022-01-11 VITALS — BP 134/90 | HR 78 | Ht 66.0 in | Wt 170.0 lb

## 2022-01-11 DIAGNOSIS — M9903 Segmental and somatic dysfunction of lumbar region: Secondary | ICD-10-CM

## 2022-01-11 DIAGNOSIS — M9904 Segmental and somatic dysfunction of sacral region: Secondary | ICD-10-CM

## 2022-01-11 DIAGNOSIS — M9901 Segmental and somatic dysfunction of cervical region: Secondary | ICD-10-CM

## 2022-01-11 DIAGNOSIS — M9908 Segmental and somatic dysfunction of rib cage: Secondary | ICD-10-CM | POA: Diagnosis not present

## 2022-01-11 DIAGNOSIS — M9902 Segmental and somatic dysfunction of thoracic region: Secondary | ICD-10-CM

## 2022-01-11 DIAGNOSIS — M5030 Other cervical disc degeneration, unspecified cervical region: Secondary | ICD-10-CM

## 2022-01-11 NOTE — Assessment & Plan Note (Signed)
Chronic, with mild exacerbation, patient did travel recently and did have mild tenderness noted on the left side of the neck again.  Responded extremely well to osteopathic manipulation.  Discussed icing regimen and home exercises, discussed which activities to do and which ones to avoid, increase activity slowly.  Follow-up again in 6 to 8 weeks ?

## 2022-01-11 NOTE — Patient Instructions (Signed)
Good to see you! ?Enjoy time with your daughter ?See you again in 4-6 weeks ?

## 2022-02-04 NOTE — Progress Notes (Signed)
[facility] [place] Phone: [phone] Subjective:   [name], am serving as a scribe for Dr. Hulan Saas.   I'm seeing this patient by the request  of:  Waldemar Dickens, MD  CC: Back and neck pain follow-up  [id number]  [name] is a 56 y.o. female coming in with complaint of back and neck pain. OTM 01/11/2022. Patient states that she took prednisone before her trip as her neck and shoulder felt locked up. Had massage last week but feels that L shoulder is still locked up   Medications patient has been prescribed: Hydroxizine, Zanaflex  Taking:         Reviewed prior external information including notes and imaging from previsou exam, outside providers and external EMR if available.   As well as notes that were available from care everywhere and other healthcare systems.  Past medical history, social, surgical and family history all reviewed in electronic medical record.  [name] pertanent information unless stated regarding to the chief complaint.   Past Medical History:  Diagnosis Date   Cancer ([place]) 08/2006   neoplasm appendix-removed [name] chemo   Family history of breast cancer    Family history of prostate cancer    Interstitial cystitis    mild   Neoplasm of appendix    Thyroid disease     Allergies  Allergen Reactions   Elemental Sulfur Hives   Sulfa Antibiotics Hives and Rash    Reaction not listed     Review of Systems:  [name] headache, visual changes, nausea, vomiting, diarrhea, constipation, dizziness, abdominal pain, skin rash, fevers, chills, night sweats, weight loss, swollen lymph nodes, body aches, joint swelling, chest pain, shortness of breath, mood changes. POSITIVE muscle aches  Objective  Blood pressure 108/78, pulse 69, height '5\' 6"'$  (1.676 m), weight 174 lb (78.9 kg), SpO2 93 %.   General: [name] apparent distress alert and oriented x3 mood and affect normal, dressed  appropriately.  HEENT: Pupils equal, extraocular movements intact  Respiratory: Patient's speak in full sentences and does not appear short of breath  Cardiovascular: [name] lower extremity edema, non tender, [name] erythema  Patient does have some loss of lordosis of the neck.  Severe tightness noted on the left side of the musculature.  Seems to be 3 muscles that have multiple trigger points.  Limited sidebending and extension of the neck.  Trapezius, rhomboid and levator scapula all have trigger points.  Osteopathic findings  C2 flexed rotated and side bent left C6 flexed rotated and side bent left T3 extended rotated and side bent left inhaled rib T9 extended rotated and side bent left L2 flexed rotated and side bent right Sacrum right on right   After verbal consent patient was prepped with alcohol swab and with a 25-gauge half inch needle injected into 4 distinct trigger points in the trapezius, rhomboid, levator scapula.  Total of 3 cc of 0.5% Marcaine and 1 cc of Kenalog 40 mg/mL used.  Trace blood loss.  Postinjection instructions given    Assessment and Plan:  Trigger point of left shoulder region Injections given today.  Could be still radicular symptoms from the cervical spine.  We discussed with patient about different treatment options and different medications.  Patient would like to avoid significant medications and would like to avoid also any type of epidural injection.  Discussed icing regimen and home exercises, discussed posture and ergonomics.  Worsening pain may need to  consider 1 of those other treatment options.  Follow-up again in 6 to 8 weeks.   Nonallopathic problems  Decision today to treat with OMT was based on Physical Exam  After verbal consent patient was treated with HVLA, ME, FPR techniques in cervical, rib, thoracic, lumbar, and sacral  areas  Patient tolerated the procedure well with improvement in symptoms  Patient given exercises, stretches and lifestyle  modifications  See medications in patient instructions if given  Patient will follow up in 4-8 weeks      The above documentation has been reviewed and is accurate and complete Lyndal Pulley, DO        Note: This dictation was prepared with Dragon dictation along with smaller phrase technology. Any transcriptional errors that result from this process are unintentional.

## 2022-02-08 ENCOUNTER — Ambulatory Visit: Payer: 59 | Admitting: Family Medicine

## 2022-02-08 ENCOUNTER — Encounter: Payer: Self-pay | Admitting: Family Medicine

## 2022-02-08 VITALS — BP 108/78 | HR 69 | Ht 66.0 in | Wt 174.0 lb

## 2022-02-08 DIAGNOSIS — M9908 Segmental and somatic dysfunction of rib cage: Secondary | ICD-10-CM | POA: Diagnosis not present

## 2022-02-08 DIAGNOSIS — M25512 Pain in left shoulder: Secondary | ICD-10-CM | POA: Diagnosis not present

## 2022-02-08 DIAGNOSIS — M9901 Segmental and somatic dysfunction of cervical region: Secondary | ICD-10-CM | POA: Diagnosis not present

## 2022-02-08 DIAGNOSIS — M9902 Segmental and somatic dysfunction of thoracic region: Secondary | ICD-10-CM

## 2022-02-08 DIAGNOSIS — M9903 Segmental and somatic dysfunction of lumbar region: Secondary | ICD-10-CM

## 2022-02-08 DIAGNOSIS — M9904 Segmental and somatic dysfunction of sacral region: Secondary | ICD-10-CM | POA: Diagnosis not present

## 2022-02-08 DIAGNOSIS — M5030 Other cervical disc degeneration, unspecified cervical region: Secondary | ICD-10-CM | POA: Diagnosis not present

## 2022-02-08 NOTE — Patient Instructions (Signed)
Trigger point injections today See me again in 5 weeks

## 2022-02-08 NOTE — Assessment & Plan Note (Signed)
Chronic, with mild exacerbation.  Do not know if it is causing more of the shoulder pain or if it is being more exacerbated secondary to the shoulder.  Injections given.  Home exercises.  Follow-up again in 6 to 8 weeks

## 2022-02-08 NOTE — Assessment & Plan Note (Signed)
Injections given today.  Could be still radicular symptoms from the cervical spine.  We discussed with patient about different treatment options and different medications.  Patient would like to avoid significant medications and would like to avoid also any type of epidural injection.  Discussed icing regimen and home exercises, discussed posture and ergonomics.  Worsening pain may need to consider 1 of those other treatment options.  Follow-up again in 6 to 8 weeks.

## 2022-02-24 IMAGING — MR MR CERVICAL SPINE W/O CM
4 of 5 series · 28 of 48 positions shown · non-contrast
Comparison: Radiographs of the cervical spine 03/24/2021. Report
from MRI of the cervical spine 03/25/2002 (images unavailable).

CLINICAL DATA: Cervical spine pain 0GB.Y ([id number]). Cervical
radiculopathy, no red flags. Additional history provided: Patient
reports neck pain for 1 month.

EXAM:
MRI CERVICAL SPINE WITHOUT CONTRAST
TECHNIQUE: Multiplanar, multisequence MR imaging of the cervical spine was
performed. No intravenous contrast was administered.

[Series 3: T2 · sagittal · 3.0mm · 0.66mm/px · 7 of 15 slices shown (1 of 2)]
[im 1/15]
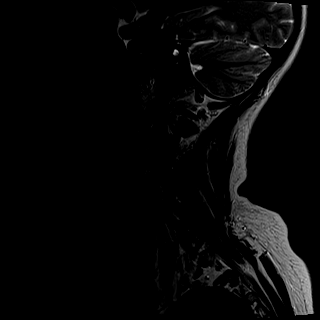
[im 3/15]
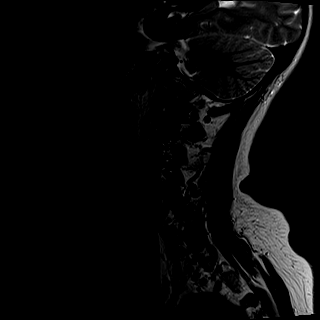
[im 5/15]
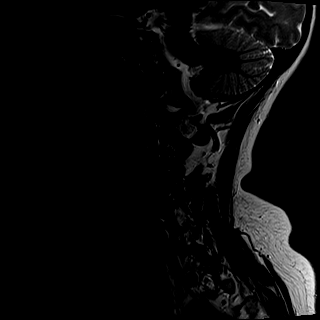
[im 8/15]
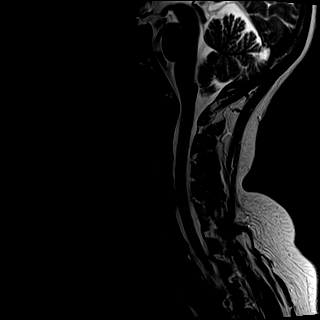
[im 10/15]
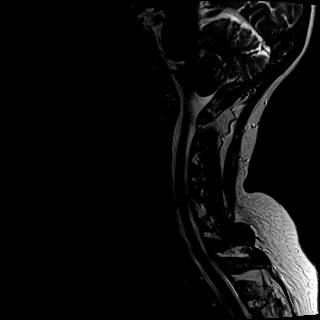
[im 12/15]
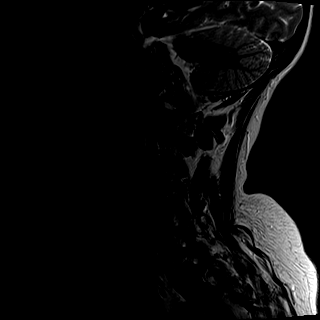
[im 15/15]
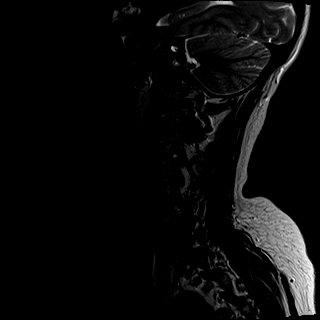

[Series 4: T1 · sagittal · 3.0mm · 0.41mm/px · 8 of 15 slices shown]
[im 1/15]
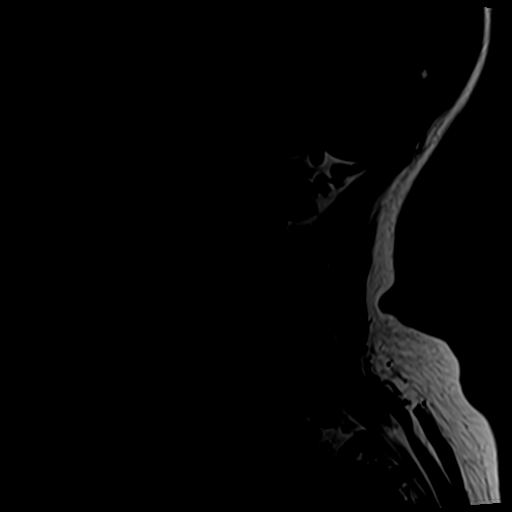
[im 3/15]
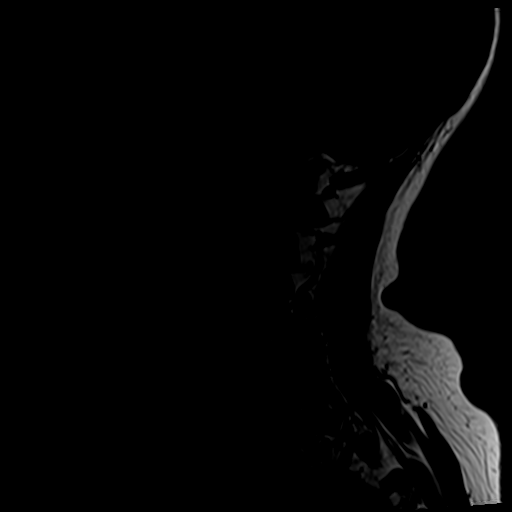
[im 5/15]
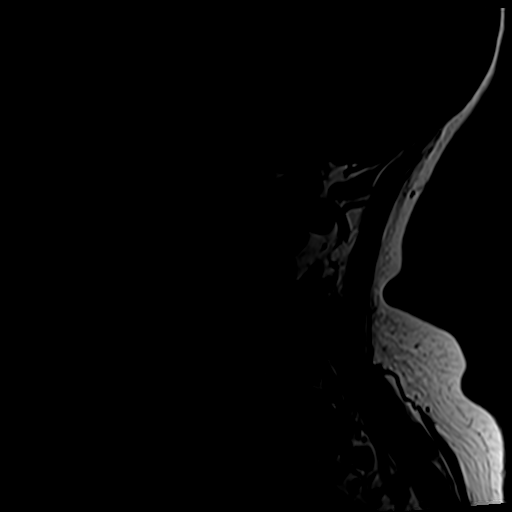
[im 7/15]
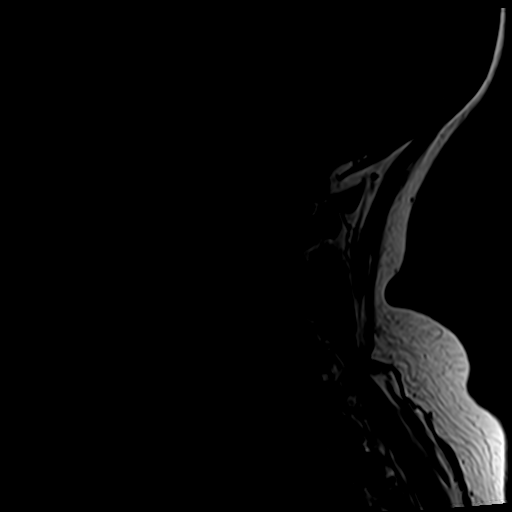
[im 9/15]
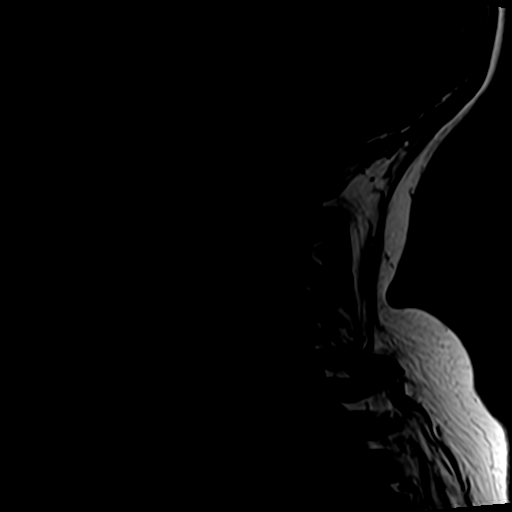
[im 11/15]
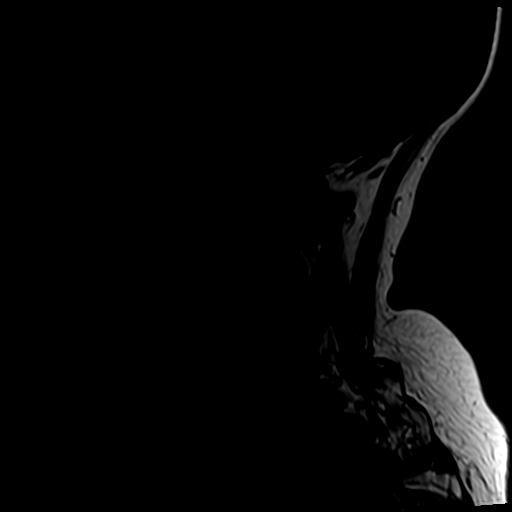
[im 13/15]
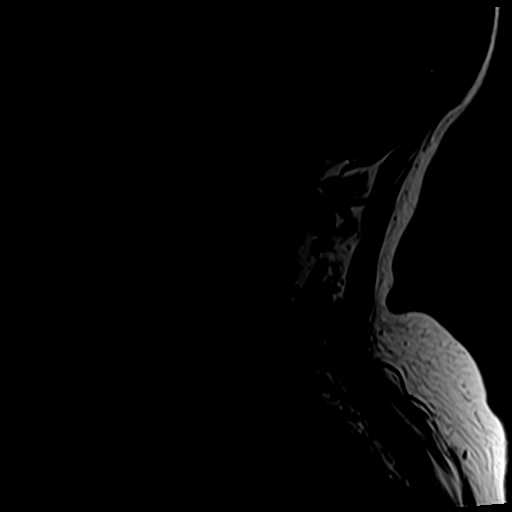
[im 15/15]
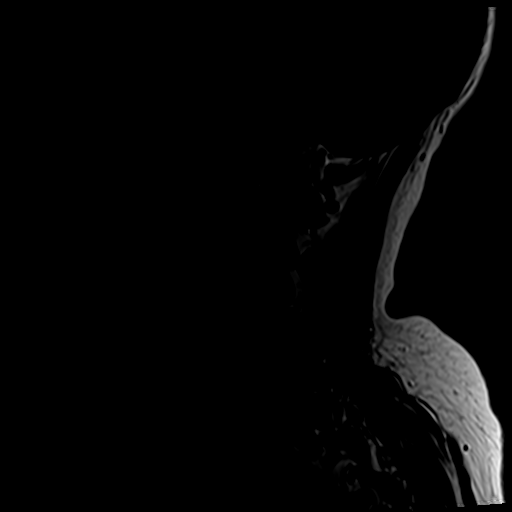

[Series 5: tir sag · sagittal · 3.0mm · 0.41mm/px · 4 of 15 slices shown]
[im 1/15]
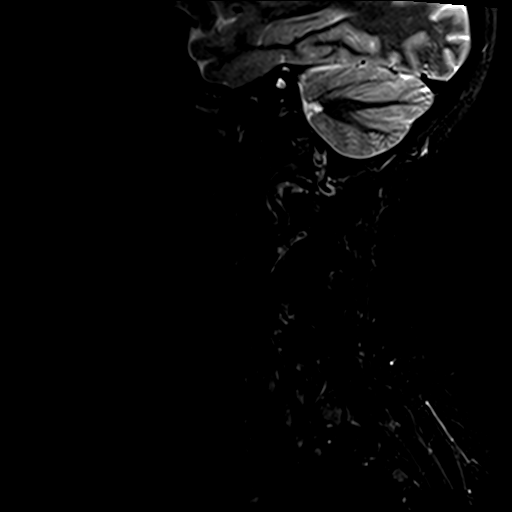
[im 3/15]
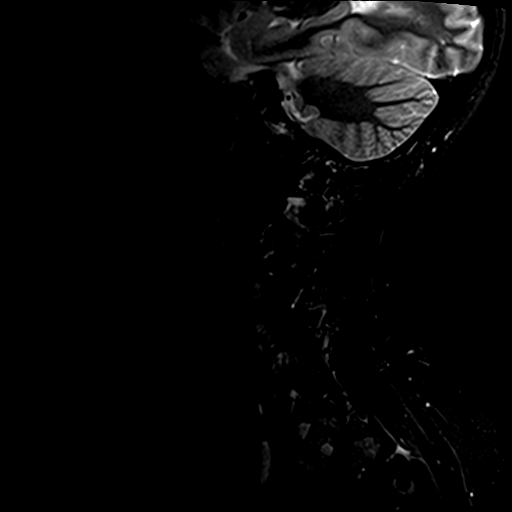
[im 9/15]
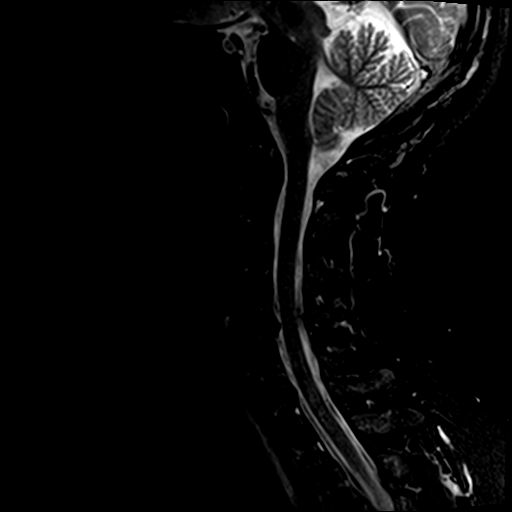
[im 13/15]
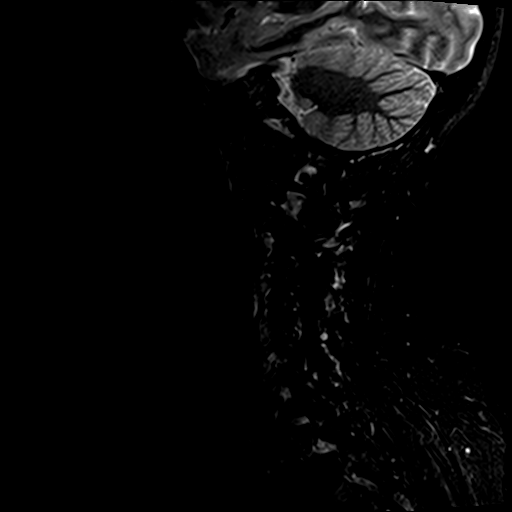

[Series 7: T2 · axial · 3.0mm · 0.70mm/px · z∈[-56,+30]mm · 9 of 24 slices shown (2 of 2)]
[im 1/24]
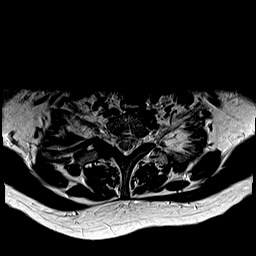
[im 5/24]
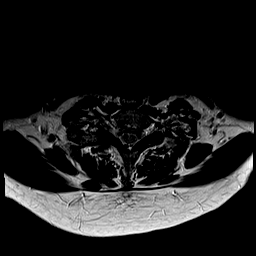
[im 7/24]
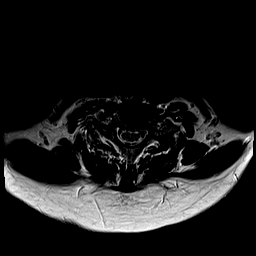
[im 11/24]
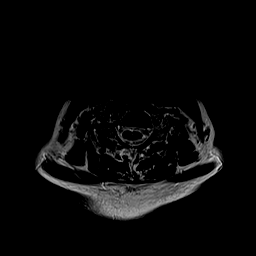
[im 13/24]
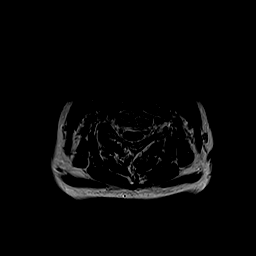
[im 17/24]
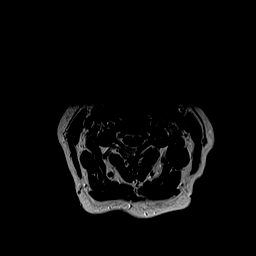
[im 19/24]
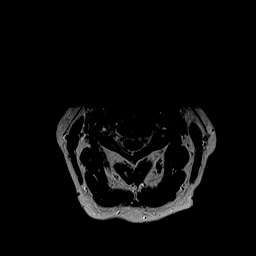
[im 21/24]
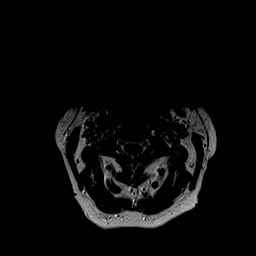
[im 24/24]
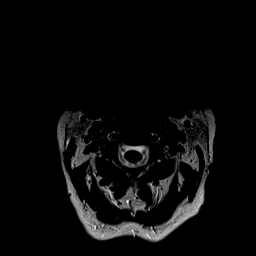

[28 of 48 positions shown; findings below may reference images not displayed]

FINDINGS: Alignment: No significant spondylolisthesis.

Vertebrae: Redemonstrated congenital non segmentation of the C5-C6
vertebrae.

Cord: No significant marrow edema or focal suspicious osseous
lesion.

Posterior Fossa, vertebral arteries, paraspinal tissues: No
abnormality identified within included portions of the posterior
fossa. Flow voids preserved within the imaged cervical vertebral
arteries. Paraspinal soft tissues unremarkable.

Disc levels:

Mild multilevel disc degeneration, greatest at C4-C5 and C6-C7.

C2-C3: No significant disc herniation or stenosis.

C3-C4: Uncovertebral hypertrophy on the left. No significant disc
herniation or spinal canal stenosis. Mild left neural foraminal
narrowing.

C4-C5: Disc bulge with right greater than left disc osteophyte
ridge/uncinate hypertrophy. Minimal facet arthrosis. Mild spinal
canal stenosis (without spinal cord mass effect). Bilateral neural
foraminal narrowing (moderate/severe right, mild/moderate left).

C5-C6: Congenital non segmentation. No significant disc herniation
or stenosis.

C6-C7: Disc bulge with endplate spurring. No significant spinal
canal or foraminal stenosis.

C7-T1: Mild facet arthrosis. No significant disc herniation or
stenosis.
IMPRESSION: Redemonstrated congenital non-segmentation of the C5-C6 vertebrae.

Cervical spondylosis, as outlined and with findings most notably as
follows.

At C4-C5, there is mild disc degeneration. Disc bulge with right
greater than left disc osteophyte ridge/uncinate hypertrophy.
Minimal facet arthrosis. Mild spinal canal narrowing (without spinal
cord mass effect). Bilateral neural foraminal narrowing
(moderate/severe right, mild/moderate left).

No significant spinal canal stenosis at the remaining levels. Mild
left foraminal stenosis at C3-C4.

## 2022-02-24 NOTE — Progress Notes (Deleted)
  [place] Phone: [phone] Subjective:    I'm seeing this patient by the request  of:  Waldemar Dickens, MD  CC:   NOI:[id number]  ZIAIRE [name] is a 56 y.o. female coming in with complaint of back and neck pain. OMT 02/08/2022. Patient states   Medications patient has been prescribed: None  Taking:         Reviewed prior external information including notes and imaging from previsou exam, outside providers and external EMR if available.   As well as notes that were available from care everywhere and other healthcare systems.  Past medical history, social, surgical and family history all reviewed in electronic medical record.  No pertanent information unless stated regarding to the chief complaint.   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 POSITIVE muscle aches  Objective  There were no vitals taken for this visit.   General: No apparent distress alert and oriented x3 mood and affect normal, dressed appropriately.  HEENT: Pupils equal, extraocular movements intact  Respiratory: Patient's speak in full sentences and does not appear short of breath  Cardiovascular: No lower extremity edema, non tender, no erythema  Gait MSK:  Back   Osteopathic findings  C2 flexed rotated and side bent right C6 flexed  rotated and side bent left T3 extended rotated and side bent right inhaled rib T9 extended rotated and side bent left L2 flexed rotated and side bent right Sacrum right on right       Assessment and Plan:  No problem-specific Assessment & Plan notes found for this encounter.    Nonallopathic problems  Decision today to treat with OMT was based on Physical Exam  After verbal consent patient was treated with HVLA, ME, FPR techniques in cervical, rib, thoracic, lumbar, and sacral  areas  Patient tolerated the procedure well with improvement in symptoms  Patient given exercises, stretches and lifestyle modifications  See medications in patient instructions if given  Patient will follow up in 4-8 weeks             Note: This dictation was prepared with Dragon dictation along with smaller phrase technology. Any transcriptional errors that result from this process are unintentional.

## 2022-03-02 ENCOUNTER — Ambulatory Visit: Payer: 59 | Admitting: Family Medicine

## 2022-03-31 NOTE — Progress Notes (Signed)
[place] Phone: [phone] Subjective:   [name], am serving as a scribe for Dr. Hulan Saas.  I'm seeing this patient by the request  of:  Waldemar Dickens, MD  CC: Neck pain and back pain follow-up  [id number]  [name] is a 56 y.o. female coming in with complaint of back and neck pain. OMT on 02/08/2022. Patient states that her back has not been bothering her as much. L shoulder was painfree for 3 weeks.   Patient got COVID in June and her shoulder pain came back. Also been getting massage as well as doing her HEP. Pain in both anterior and posterior aspect of the shoulder. Pain is constant.   Medications patient has been prescribed: Prednisone Zanaflex hydroxyzine  Taking:         Reviewed prior external information including notes and imaging from previsou exam, outside providers and external EMR if available.   As well as notes that were available from care everywhere and other healthcare systems.  Past medical history, social, surgical and family history all reviewed in electronic medical record.  No pertanent information unless stated regarding to the chief complaint.   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 POSITIVE muscle aches  Objective  Blood pressure 116/78, pulse 87, height  '5\' 6"'$  (1.676 m), weight 172 lb (78 kg), SpO2 92 %.   General: No apparent distress alert and oriented x3 mood and affect normal, dressed appropriately.  HEENT: Pupils equal, extraocular movements intact  Respiratory: Patient's speak in full sentences and does not appear short of breath  Cardiovascular: No lower extremity edema, non tender, no erythema  Gait MSK:  Back back exam does have some mild loss of lordosis.  Neck exam does have some loss of lordosis as well.  Still limited in the right-sided rotation and left-sided sidebending.  Does have some difficulty with extension of the neck.  Osteopathic findings  C2 flexed rotated and side bent right C6 flexed rotated and side bent left T3 extended rotated and side bent right inhaled rib T9 extended rotated and side bent left L2 flexed rotated and side bent right Sacrum right on right       Assessment and Plan:  Neck muscle spasm Continue tightness of the neck noted.  Still not as severe as what it was previously.  Patient had recent illness and likely did cause some of the discomfort as well.  Discussed home exercises and icing regimen.  Follow-up with me again in 6 to 8 weeks otherwise.    Nonallopathic problems  Decision today to treat with OMT was based on Physical Exam  After verbal consent patient was treated with HVLA, ME, FPR techniques in cervical, rib, thoracic, lumbar, and sacral  areas  Patient tolerated the procedure well with  improvement in symptoms  Patient given exercises, stretches and lifestyle modifications  See medications in patient instructions if given  Patient will follow up in 4-8 weeks     The above documentation has been reviewed and is accurate and complete Lyndal Pulley, DO         Note: This dictation was prepared with Dragon dictation along with smaller phrase technology. Any transcriptional errors that result from this process are unintentional.

## 2022-04-01 ENCOUNTER — Ambulatory Visit: Payer: 59 | Admitting: Family Medicine

## 2022-04-01 ENCOUNTER — Encounter: Payer: Self-pay | Admitting: Family Medicine

## 2022-04-01 VITALS — BP 116/78 | HR 87 | Ht 66.0 in | Wt 172.0 lb

## 2022-04-01 DIAGNOSIS — M9908 Segmental and somatic dysfunction of rib cage: Secondary | ICD-10-CM | POA: Diagnosis not present

## 2022-04-01 DIAGNOSIS — M62838 Other muscle spasm: Secondary | ICD-10-CM | POA: Diagnosis not present

## 2022-04-01 DIAGNOSIS — M9901 Segmental and somatic dysfunction of cervical region: Secondary | ICD-10-CM

## 2022-04-01 DIAGNOSIS — M9904 Segmental and somatic dysfunction of sacral region: Secondary | ICD-10-CM | POA: Diagnosis not present

## 2022-04-01 DIAGNOSIS — M9902 Segmental and somatic dysfunction of thoracic region: Secondary | ICD-10-CM

## 2022-04-01 DIAGNOSIS — M9903 Segmental and somatic dysfunction of lumbar region: Secondary | ICD-10-CM

## 2022-04-01 NOTE — Patient Instructions (Signed)
See me in 5-6 weeks  Continue posture exercises Making improvement

## 2022-04-02 NOTE — Assessment & Plan Note (Signed)
Continue tightness of the neck noted.  Still not as severe as what it was previously.  Patient had recent illness and likely did cause some of the discomfort as well.  Discussed home exercises and icing regimen.  Follow-up with me again in 6 to 8 weeks otherwise.

## 2022-04-05 ENCOUNTER — Ambulatory Visit: Payer: 59 | Admitting: Family Medicine

## 2022-04-27 NOTE — Progress Notes (Unsigned)
[name] [address] [place] Phone: [phone] Subjective:   [name], am serving as a scribe for Dr. Hulan Saas.  I'm seeing this patient by the request  of:  Mary Dickens, MD  CC: Neck and back pain follow-up  [id number]  [name] is a 56 y.o. female coming in with complaint of back and neck pain. OMT 04/01/2022. Patient states left shoulder is still bothersome. Was okay for about 2 weeks after last visit. Has good and bad days. No other issues.  States that her neck pain seems to be worsening.  Has had MRI showing the patient did have degenerative disc disease.  Medications patient has been prescribed: None  Taking:         Reviewed prior external information including notes and imaging from previsou exam, outside providers and external EMR if available.   As well as notes that were available from care everywhere and other healthcare systems.  Past medical history, social, surgical and family history all reviewed in electronic medical record.  No pertanent information unless stated regarding to the chief complaint.   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 POSITIVE muscle aches  Objective  Blood pressure 124/82, pulse 81, height '5\' 6"'$  (1.676 m), weight 175 lb (79.4 kg), SpO2 90 %.   General: No apparent  distress alert and oriented x3 mood and affect normal, dressed appropriately.  HEENT: Pupils equal, extraocular movements intact  Respiratory: Patient's speak in full sentences and does not appear short of breath  Cardiovascular: No lower extremity edema, non tender, no erythema  MSK:  Back does have some mild loss of lordosis.  Neck exam does have significant tightness noted left greater than right. Left shoulder though does have relatively good range of motion.  Very mild positive crossover noted.  5 out of 5 strength of the rotator cuff.  Osteopathic findings  C2 flexed rotated and side bent right C6 flexed rotated and side bent left T3 extended rotated and side bent right inhaled rib T9 extended rotated and side bent left L2 flexed rotated and side bent right Sacrum right on right       Assessment and Plan:  Degenerative cervical disc Known degenerative disc disease.  Concerned that still some of the ostial arthritic changes could be potentially contributing to some of the discomfort and pain.  Discussed that this could be more radicular symptoms as well.  Could consider different medications such as gabapentin but patient would like to avoid it.  Discussed the possibility of an epidural injection to that I think it could also be very helpful.  Patient wants to hold at this time.  Did respond relatively well to osteopathic manipulation and he is going to be doing follow-up with me again in 6 to 8 weeks Toradol and Depo-Medrol  given today.    Nonallopathic problems  Decision today to treat with OMT was based on Physical Exam  After verbal consent patient was treated with HVLA, ME, FPR techniques in cervical, rib, thoracic, lumbar, and sacral  areas  Patient tolerated the procedure well with improvement in symptoms  Patient given exercises, stretches and lifestyle modifications  See medications in patient instructions if given  Patient will follow up in 4-8 weeks    The  above documentation has been reviewed and is accurate and complete Mary Pulley, DO          Note: This dictation was prepared with Dragon dictation along with smaller phrase technology. Any transcriptional errors that result from this process are unintentional.

## 2022-04-29 ENCOUNTER — Ambulatory Visit (INDEPENDENT_AMBULATORY_CARE_PROVIDER_SITE_OTHER): Payer: 59

## 2022-04-29 ENCOUNTER — Ambulatory Visit: Payer: 59 | Admitting: Family Medicine

## 2022-04-29 VITALS — BP 124/82 | HR 81 | Ht 66.0 in | Wt 175.0 lb

## 2022-04-29 DIAGNOSIS — M9903 Segmental and somatic dysfunction of lumbar region: Secondary | ICD-10-CM

## 2022-04-29 DIAGNOSIS — M5030 Other cervical disc degeneration, unspecified cervical region: Secondary | ICD-10-CM

## 2022-04-29 DIAGNOSIS — M25512 Pain in left shoulder: Secondary | ICD-10-CM

## 2022-04-29 DIAGNOSIS — M9901 Segmental and somatic dysfunction of cervical region: Secondary | ICD-10-CM | POA: Diagnosis not present

## 2022-04-29 DIAGNOSIS — G8929 Other chronic pain: Secondary | ICD-10-CM

## 2022-04-29 DIAGNOSIS — M9904 Segmental and somatic dysfunction of sacral region: Secondary | ICD-10-CM | POA: Diagnosis not present

## 2022-04-29 DIAGNOSIS — M9902 Segmental and somatic dysfunction of thoracic region: Secondary | ICD-10-CM

## 2022-04-29 DIAGNOSIS — M9908 Segmental and somatic dysfunction of rib cage: Secondary | ICD-10-CM

## 2022-04-29 MED ORDER — KETOROLAC TROMETHAMINE 60 MG/2ML IM SOLN
60.0000 mg | Freq: Once | INTRAMUSCULAR | Status: AC
  Administered 2022-04-29: 60 mg via INTRAMUSCULAR

## 2022-04-29 MED ORDER — METHYLPREDNISOLONE ACETATE 80 MG/ML IJ SUSP
80.0000 mg | Freq: Once | INTRAMUSCULAR | Status: AC
Start: 2022-04-29 — End: 2022-04-29
  Administered 2022-04-29: 80 mg via INTRAMUSCULAR

## 2022-04-29 NOTE — Assessment & Plan Note (Signed)
Known degenerative disc disease.  Concerned that still some of the ostial arthritic changes could be potentially contributing to some of the discomfort and pain.  Discussed that this could be more radicular symptoms as well.  Could consider different medications such as gabapentin but patient would like to avoid it.  Discussed the possibility of an epidural injection to that I think it could also be very helpful.  Patient wants to hold at this time.  Did respond relatively well to osteopathic manipulation and he is going to be doing follow-up with me again in 6 to 8 weeks Toradol and Depo-Medrol given today.

## 2022-04-29 NOTE — Patient Instructions (Addendum)
Xrays today Upright posture device

## 2022-05-03 ENCOUNTER — Ambulatory Visit: Payer: 59 | Admitting: Family Medicine

## 2022-05-31 NOTE — Progress Notes (Unsigned)
[name] [name] Phone: [phone] Subjective:   [name], am serving as a scribe for Dr. Hulan Saas.  I'm seeing this patient by the request  of:  Waldemar Dickens, MD  CC: Back and neck pain follow-up  [id number]  [name] is a 56 y.o. female coming in with complaint of back and neck pain. OMT 04/29/2022 and also f/u for L shoulder pain. Patient states that back and neck are doing ok. Shoulder pain is worse. Injections helped for one week.   Medications patient has been prescribed:  Zanaflex Taking:         Reviewed prior external information including notes and imaging from previsou exam, outside providers and external EMR if available.   As well as notes that were available from care everywhere and other healthcare systems.  Past medical history, social, surgical and family history all reviewed in electronic medical record.  No pertanent information unless stated regarding to the chief complaint.   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 POSITIVE muscle aches  Objective  Blood pressure 116/78, pulse 79, height '5\' 6"'$  (1.676 m), weight 176 lb (79.8 kg), SpO2 98 %.   General: No apparent distress alert and oriented x3 mood and affect normal, dressed appropriately.  HEENT: Pupils equal,  extraocular movements intact  Respiratory: Patient's speak in full sentences and does not appear short of breath  Cardiovascular: No lower extremity edema, non tender, no erythema  Left shoulder exam shows the patient does have positive impingement noted with Neer and Hawkins.  Patient does have some weakness with external rotation.  Patient does have a mild positive crossover test.  Limited muscular skeletal ultrasound was performed and interpreted by Hulan Saas, M  Patient calcific changes as noted previously and the bicep tendon appears to be improved.  Patient does still have some hypoechoic changes of the subscapularis and the supraspinatus.  Patient does have some mild hypoechoic changes of the glenohumeral joint posteriorly as well as narrowing of the acromioclavicular joint.  Osteopathic findings  T9 extended rotated and side bent left L2 flexed rotated and side bent right Sacrum right on right       Assessment and Plan:  Left shoulder pain Patient's left shoulder seems to be worsening at this point.  Initially did have calcific changes.  Now having signs and symptoms consistent with possible more labral pathology.  Has failed formal physical therapy, home exercises, injections as well as massage therapy.  Patient will have the MR arthrogram done to see if anything else would change management.  Could be a candidate for shockwave therapy if it is more calcific changes or PRP if there is any small tears.  Otherwise patient would reluctantly consider surgical intervention if needed if labral pathology exist.  Neck muscle spasm Likely  patient's neck has been doing relatively well.  Avoided that with osteopathic manipulation.  Did respond well though to the osteopathic manipulation of the lower back with improvement in range of motion almost immediately.  Follow-up again in 6 to 8 weeks    Nonallopathic problems  Decision today to treat with OMT was based on Physical  Exam  After verbal consent patient was treated with HVLA, ME, FPR techniques in  thoracic, lumbar, and sacral  areas  Patient tolerated the procedure well with improvement in symptoms  Patient given exercises, stretches and lifestyle modifications  See medications in patient instructions if given  Patient will follow up in 4-8 weeks     The above documentation has been reviewed and is accurate and complete Lyndal Pulley, DO         Note: This dictation was prepared with Dragon dictation along with smaller phrase technology. Any transcriptional errors that result from this process are unintentional.

## 2022-06-02 ENCOUNTER — Ambulatory Visit: Payer: 59 | Admitting: Family Medicine

## 2022-06-02 ENCOUNTER — Ambulatory Visit: Payer: Self-pay

## 2022-06-02 ENCOUNTER — Encounter: Payer: Self-pay | Admitting: Family Medicine

## 2022-06-02 VITALS — BP 116/78 | HR 79 | Ht 66.0 in | Wt 176.0 lb

## 2022-06-02 DIAGNOSIS — M62838 Other muscle spasm: Secondary | ICD-10-CM

## 2022-06-02 DIAGNOSIS — M9902 Segmental and somatic dysfunction of thoracic region: Secondary | ICD-10-CM | POA: Diagnosis not present

## 2022-06-02 DIAGNOSIS — M9903 Segmental and somatic dysfunction of lumbar region: Secondary | ICD-10-CM

## 2022-06-02 DIAGNOSIS — G8929 Other chronic pain: Secondary | ICD-10-CM | POA: Diagnosis not present

## 2022-06-02 DIAGNOSIS — M25512 Pain in left shoulder: Secondary | ICD-10-CM | POA: Diagnosis not present

## 2022-06-02 DIAGNOSIS — M9904 Segmental and somatic dysfunction of sacral region: Secondary | ICD-10-CM

## 2022-06-02 NOTE — Patient Instructions (Addendum)
MRA L shoulder [phone] Dr. Kerney [facility] See me in 6 weeks

## 2022-06-02 NOTE — Assessment & Plan Note (Signed)
Patient's left shoulder seems to be worsening at this point.  Initially did have calcific changes.  Now having signs and symptoms consistent with possible more labral pathology.  Has failed formal physical therapy, home exercises, injections as well as massage therapy.  Patient will have the MR arthrogram done to see if anything else would change management.  Could be a candidate for shockwave therapy if it is more calcific changes or PRP if there is any small tears.  Otherwise patient would reluctantly consider surgical intervention if needed if labral pathology exist.

## 2022-06-02 NOTE — Assessment & Plan Note (Signed)
Likely patient's neck has been doing relatively well.  Avoided that with osteopathic manipulation.  Did respond well though to the osteopathic manipulation of the lower back with improvement in range of motion almost immediately.  Follow-up again in 6 to 8 weeks

## 2022-06-08 ENCOUNTER — Ambulatory Visit: Payer: Self-pay | Admitting: General Surgery

## 2022-06-08 NOTE — H&P (Signed)
Chief Complaint: Hernia       History of Present Illness: [name] is a 56 y.o. female who is seen today as an office consultation at the request of Dr. Helane Rima for evaluation of Hernia .   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  She is unsure the type however she was watched for 5 years.  She did not require further surgery after the appendectomy. Patient subsequently developed a umbilical hernia.  This was fixed by Dr. Excell Seltzer several years ago.  He did this in the open fashion.   She states that recently she has noticed a bulge to the right of the umbilicus.  She states that she does have some pain and discomfort.   Patient states she is undergoing work-up for left shoulder labral tear.  She is due for MRI.           Review of Systems: A complete review of systems was obtained from the patient.  I have reviewed this information and discussed as appropriate with the patient.  See HPI as well for other ROS.   Review of Systems  Constitutional:  Negative for fever.  HENT:  Negative for congestion.   Eyes:  Negative for blurred vision.  Respiratory:  Negative for cough, shortness of breath and wheezing.   Cardiovascular:  Negative for chest pain and palpitations.  Gastrointestinal:  Negative for heartburn.  Genitourinary:  Negative for dysuria.  Musculoskeletal:  Negative for myalgias.  Skin:  Negative for rash.  Neurological:  Negative for dizziness and headaches.  Psychiatric/Behavioral:  Negative for depression and suicidal ideas.   All other systems reviewed and are negative.      Medical History: Past Medical History Past Medical History: Diagnosis Date  Arthritis    History of cancer    Thyroid disease        There is no problem list on file for this patient.     Past Surgical History Past Surgical History: Procedure Laterality Date  APPENDECTOMY      HERNIA  REPAIR      LAPAROSCOPIC TUBAL LIGATION          Allergies Allergies Allergen Reactions  Sulfa (Sulfonamide Antibiotics) Hives, Other (See Comments) and Rash     Reaction not listed   Reaction not listed  Reaction not listed      Current Outpatient Medications on File Prior to Visit Medication Sig Dispense Refill  ARMOUR THYROID 60 mg tablet Take 1 tablet by mouth 2 (two) times daily      cholecalciferol (VITAMIN D3) 400 unit tablet Take by mouth      fexofenadine (ALLEGRA) 180 MG tablet Take 180 mg by mouth once daily      L.acid/L.casei/B.bif/B.lon/FOS (PROBIOTIC BLEND ORAL) Take 1 tablet by mouth once daily      liothyronine (CYTOMEL) 5 MCG tablet Take 1.5 tablets by mouth 2 (two) times daily      magnesium citrate, bulk, Powd Take 1 tablet by mouth once daily      multivit-min/ferrous fumarate (MULTI VITAMIN ORAL) Take 1 tablet by mouth once daily      progesterone (PROMETRIUM) 200 MG capsule Take 2 capsules by mouth at bedtime      ubidecarenone (CO Q-10 ORAL) Take by mouth       No current facility-administered medications on file prior to visit.     Family History Family History Problem Relation Age of Onset  Obesity Mother  Diabetes Mother    Breast cancer Mother    Skin cancer Father    High blood pressure (Hypertension) Father    Hyperlipidemia (Elevated cholesterol) Father    Diabetes Brother        Social History   Tobacco Use Smoking Status Never Smokeless Tobacco Never     Social History Social History    Socioeconomic History  Marital status: Married Tobacco Use  Smoking status: Never  Smokeless tobacco: Never      Objective:     Vitals:   06/08/22 1449 BP: 124/84 Pulse: 90 Weight: 81.2 kg (179 lb) Height: 168.9 cm (5' 6.5")   Body mass index is 28.46 kg/m.   Physical Exam Constitutional:      Appearance: Normal appearance.  HENT:     Head: Normocephalic and atraumatic.     Mouth/Throat:     Mouth: Mucous membranes are  moist.     Pharynx: Oropharynx is clear.  Eyes:     General: No scleral icterus.    Pupils: Pupils are equal, round, and reactive to light.  Cardiovascular:     Rate and Rhythm: Normal rate and regular rhythm.     Pulses: Normal pulses.     Heart sounds: No murmur heard.   No friction rub. No gallop.  Pulmonary:     Effort: Pulmonary effort is normal. No respiratory distress.     Breath sounds: Normal breath sounds. No stridor.  Abdominal:     General: Abdomen is flat.     Hernia: A hernia is present.        Comments: ttp  Musculoskeletal:        General: No swelling.  Skin:    General: Skin is warm.  Neurological:     General: No focal deficit present.     Mental Status: She is alert and oriented to person, place, and time. Mental status is at baseline.  Psychiatric:        Mood and Affect: Mood normal.        Thought Content: Thought content normal.        Judgment: Judgment normal.          Assessment and Plan: Diagnoses and all orders for this visit:   Recurrent ventral hernia     [name] is a 56 y.o. female    1. Difficult to assess the patient's body habitus and the hernia.  I do feel that there is recurrence.  We will have the patient undergo CT scan for confirmation. 2. 2.  We will call the patient with the results and schedule surgery thereafter.  I discussed with her there is no urgency or emergency to fix the hernia.             No follow-ups on file.   Ralene Ok, MD, [facility], [facility]

## 2022-06-11 ENCOUNTER — Other Ambulatory Visit: Payer: Self-pay | Admitting: General Surgery

## 2022-06-11 DIAGNOSIS — K432 Incisional hernia without obstruction or gangrene: Secondary | ICD-10-CM

## 2022-06-16 ENCOUNTER — Ambulatory Visit
Admission: RE | Admit: 2022-06-16 | Discharge: 2022-06-16 | Disposition: A | Discharge status: Home / Self Care | Payer: 59 | Source: Ambulatory Visit | Attending: General Surgery | Admitting: General Surgery

## 2022-06-16 DIAGNOSIS — K432 Incisional hernia without obstruction or gangrene: Secondary | ICD-10-CM

## 2022-06-18 ENCOUNTER — Ambulatory Visit
Admission: RE | Admit: 2022-06-18 | Discharge: 2022-06-18 | Disposition: A | Discharge status: Home / Self Care | Payer: 59 | Source: Ambulatory Visit | Attending: Family Medicine | Admitting: Family Medicine

## 2022-06-18 DIAGNOSIS — G8929 Other chronic pain: Secondary | ICD-10-CM

## 2022-06-18 MED ORDER — IOPAMIDOL (ISOVUE-M 200) INJECTION 41%
13.0000 mL | Freq: Once | INTRAMUSCULAR | Status: AC
  Administered 2022-06-18: 13 mL via INTRA_ARTICULAR

## 2022-06-21 ENCOUNTER — Encounter: Payer: Self-pay | Admitting: Family Medicine

## 2022-06-22 ENCOUNTER — Other Ambulatory Visit: Payer: Self-pay

## 2022-06-22 DIAGNOSIS — G8929 Other chronic pain: Secondary | ICD-10-CM

## 2022-06-22 DIAGNOSIS — M5030 Other cervical disc degeneration, unspecified cervical region: Secondary | ICD-10-CM

## 2022-06-22 NOTE — Telephone Encounter (Signed)
Order send to [facility] per a verbal from Dr. Tamala Julian.

## 2022-06-23 NOTE — Progress Notes (Unsigned)
[place] Phone: [phone] Subjective:   [name], am serving as a scribe for Dr. Hulan Saas.  I'm seeing this patient by the request  of:  Waldemar Dickens, MD  CC: Right shoulder pain follow-up  [id number]  [name] is a 55 y.o. female coming in with complaint of back and neck pain. OMT 06/02/2022. Also f/u for L shoulder MRI. Patient states that she is the same and shoulder continues to be her main issue. Diagnosed with hernia since last visit.   Medications patient has been prescribed: None     MRI L shoulder 06/18/2022 IMPRESSION: 1. Minimal tendinosis of the anterior most aspect of the supraspinatus tendon. 2.  No acute osseous injury of the left shoulder. Patient though did have some mild hypoechoic changes and some moderate arthritic is of the acromioclavicular joint.     Reviewed prior external information including notes and imaging from previsou exam, outside providers and external EMR if available.   As well as notes that were available from care everywhere and other healthcare systems.  Past medical history, social, surgical and family history all reviewed in electronic medical record.  No pertanent information unless stated regarding to the chief complaint.   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 POSITIVE muscle aches  Objective  Blood pressure 114/78, pulse 70, height '5\' 6"'$  (1.676 m), weight 176 lb (79.8 kg), SpO2 99 %.   General: No apparent distress alert and oriented x3 mood and affect normal, dressed appropriately.  HEENT: Pupils equal, extraocular movements intact  Respiratory: Patient's speak in full sentences and does not appear short of breath  Cardiovascular: No lower extremity edema, non tender, no erythema  Neck exam still has some limited range of motion.  Severe tenderness though over the acromioclavicular joint.  Patient does have a mild positive crossover but does have voluntary guarding in Neer and [place] as well 5 out of 5 strength of the rotator cuff  After verbal consent patient was prepped with alcohol swab and with a 25-gauge half inch needle injected into the left acromioclavicular joint.  Total of 0.5 cc of 0.5% Marcaine and 0.5 cc of Kenalog 40 mg/mL used.  No blood loss.  Band-Aid placed.  Postinjection instructions given.     Assessment and Plan:  Acromioclavicular joint pain Injection given today, tolerated the procedure well, did have some improvement in the pain by the time patient was sleeping.  We discussed with patient about icing regimen and home exercises.  We discussed which activities to do and which ones to avoid.  We discussed that there could be a possibility of some of the pain being from her cervical neck as well but hopefully not.  Follow-up again in 6 to 8 weeks. Total time reviewing patient's chart, previous  imaging including MRI of the cervical spine and MRI of the shoulder 33 minutes      The above documentation has been reviewed and is accurate and complete Lyndal Pulley, DO          Note: This dictation was prepared with Dragon dictation along with smaller phrase technology. Any transcriptional errors that result from this process are unintentional.

## 2022-06-24 ENCOUNTER — Ambulatory Visit: Payer: 59 | Admitting: Family Medicine

## 2022-06-24 DIAGNOSIS — M25512 Pain in left shoulder: Secondary | ICD-10-CM | POA: Diagnosis not present

## 2022-06-24 DIAGNOSIS — M25519 Pain in unspecified shoulder: Secondary | ICD-10-CM | POA: Insufficient documentation

## 2022-06-24 NOTE — Assessment & Plan Note (Signed)
Injection given today, tolerated the procedure well, did have some improvement in the pain by the time patient was sleeping.  We discussed with patient about icing regimen and home exercises.  We discussed which activities to do and which ones to avoid.  We discussed that there could be a possibility of some of the pain being from her cervical neck as well but hopefully not.  Follow-up again in 6 to 8 weeks.

## 2022-06-24 NOTE — Patient Instructions (Signed)
Injection today If its an IR laser I think its a good idea Setup PT in 10 days and then consider doing it for other modalities We will continue to watch the neck See you again in 7-8 weeks

## 2022-06-28 ENCOUNTER — Ambulatory Visit
Admission: RE | Admit: 2022-06-28 | Discharge: 2022-06-28 | Disposition: A | Discharge status: Home / Self Care | Payer: 59 | Source: Ambulatory Visit | Attending: Obstetrics and Gynecology | Admitting: Obstetrics and Gynecology

## 2022-06-28 DIAGNOSIS — Z1231 Encounter for screening mammogram for malignant neoplasm of breast: Secondary | ICD-10-CM

## 2022-07-05 NOTE — Progress Notes (Signed)
[name] Phone: [phone] Subjective:   [name], am serving as a scribe for Dr. Hulan Saas.  I'm seeing this patient by the request  of:  Mary Dickens, MD  CC: right shoulder   [id number]  06/24/2022 Injection given today, tolerated the procedure well, did have some improvement in the pain by the time patient was sleeping.  We discussed with patient about icing regimen and home exercises.  We discussed which activities to do and which ones to avoid.  We discussed that there could be a possibility of some of the pain being from her cervical neck as well but hopefully not.  Follow-up again in 6 to 8 weeks.    Update 07/06/2022 [name] is a 56 y.o. female coming in with complaint of R shoulder pain. Patient states that she is about 75% better. Did 4 infrared txs which were helpful. Pain is no longer in anterior shoulder but rather in L scapula. Pain 1/10. Having hernia surgery this morning.        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   Family history of breast cancer    Family history of prostate cancer    Interstitial cystitis    mild   Neoplasm of appendix    Thyroid disease    Past Surgical History:  Procedure Laterality Date   APPENDECTOMY  08/2006   BREAST CYST ASPIRATION Right 09/2020   BREAST CYST ASPIRATION Right 2018   COLONOSCOPY  04/17/2007   TUBAL LIGATION  82/4235   UMBILICAL HERNIA REPAIR  10/2016   Social History   Socioeconomic History   Marital status: Married    Spouse name: Not on file   Number of children: 2   Years of education: Not on file   Highest education level: Not on file  Occupational History   Not on file  Tobacco Use   Smoking status: Never   Smokeless tobacco: Never  Vaping Use   Vaping Use: Never used  Substance and Sexual Activity   Alcohol use: Yes    Alcohol/week:  5.0 standard drinks of alcohol    Types: 5 Glasses of wine per week    Comment: occasional   Drug use: No   Sexual activity: Not on file  Other Topics Concern   Not on file  Social History Narrative   Not on file   Social Determinants of Health   Financial Resource Strain: Not on file  Food Insecurity: Not on file  Transportation Needs: Not on file  Physical Activity: Not on file  Stress: Not on file  Social Connections: Not on file   Allergies  Allergen Reactions   Elemental Sulfur Hives   Sulfa Antibiotics Hives and Rash    Reaction not listed   Family History  Problem Relation Age of Onset   Breast cancer Mother 98       had hysterectomy at 76   Skin cancer Brother        had a few removed over lifetime, one was on chest.  Is now 66   Stomach cancer Maternal Grandmother    Lung cancer Paternal Grandmother 65       dx. early 78's died 51   Lung cancer Paternal Grandfather 61       dx in 102's, died at 20   Prostate cancer Maternal Uncle 69  died shortly after in 78's.  Metastatic- spread to brain and many other locations   Colon cancer Neg Hx    Esophageal cancer Neg Hx    Pancreatic cancer Neg Hx    Rectal cancer Neg Hx     Current Outpatient Medications (Endocrine & Metabolic):    ARMOUR THYROID PO, Take 60 mg by mouth 2 (two) times daily.    liothyronine (CYTOMEL) 25 MCG tablet, Take 50 mcg by mouth 2 (two) times daily.    predniSONE (DELTASONE) 20 MG tablet, Take 1 tablet (20 mg total) by mouth daily with breakfast.   progesterone (PROMETRIUM) 200 MG capsule, Take 400 mg by mouth 2 (two) times daily.   Current Outpatient Medications (Respiratory):    fexofenadine (ALLEGRA) 60 MG tablet, Take 60 mg by mouth once.   levocetirizine (XYZAL) 5 MG tablet, Take 5 mg by mouth every evening.   Current Outpatient Medications (Hematological):    Cyanocobalamin (VITAMIN B-12 PO), Take 1 mg by mouth daily.   IRON PO, Take 29 mg by mouth daily.  Current  Outpatient Medications (Other):    cholecalciferol (VITAMIN D) 400 units TABS tablet, Take 400 Units by mouth.   doxycycline (VIBRA-TABS) 100 MG tablet, Take 1 tablet (100 mg total) by mouth 2 (two) times daily.   EVENING PRIMROSE OIL PO, Take 1 tablet by mouth daily.   hydrOXYzine (ATARAX) 10 MG tablet, Take 1 tablet (10 mg total) by mouth 3 (three) times daily as needed.   MAGNESIUM CITRATE PO*, Take 1 tablet by mouth daily.   Multiple Vitamins-Minerals (MULTIVITAMIN ADULT PO), Take 1 tablet by mouth daily.   pantoprazole (PROTONIX) 40 MG tablet, Take 40 mg by mouth daily.   Probiotic Product (PROBIOTIC PO), Take 1 tablet by mouth daily.   tiZANidine (ZANAFLEX) 2 MG tablet, Take 1 tablet (2 mg total) by mouth at bedtime.   Vitamin D, Ergocalciferol, (DRISDOL) 50000 units CAPS capsule, TAKE ONE CAPSULE WEEKLY * These medications belong to multiple therapeutic classes and are listed under each applicable group.   Reviewed prior external information including notes and imaging from  primary care provider As well as notes that were available from care everywhere and other healthcare systems.  Past medical history, social, surgical and family history all reviewed in electronic medical record.  No pertanent information unless stated regarding to the chief complaint.   Review of Systems:  No headache, visual changes, nausea, vomiting, diarrhea, constipation, dizziness, abdominal pain, skin rash, fevers, chills, night sweats, weight loss, swollen lymph nodes, body aches, joint swelling, chest pain, shortness of breath, mood changes. POSITIVE muscle aches  Objective  Blood pressure 108/72, height '5\' 6"'$  (1.676 m), weight 176 lb (79.8 kg).   General: No apparent distress alert and oriented x3 mood and affect normal, dressed appropriately.  HEENT: Pupils equal, extraocular movements intact  Respiratory: Patient's speak in full sentences and does not appear short of breath  Cardiovascular: No lower  extremity edema, non tender, no erythema  Neck exam does have some loss of lordosis.  Some limited sidebending bilaterally.  More tightness on the left side of the neck and the left parascapular region.  Osteopathic findings C2 flexed rotated and side bent left C4 flexed rotated and side bent left C6 flexed rotated and side bent left T3 extended rotated and side bent left inhaled third rib T9 extended rotated and side bent left inhaled rib L2 flexed rotated and side bent right Sacrum right on right\    Impression and Recommendations:  The above documentation has been reviewed and is accurate and complete Lyndal Pulley, DO

## 2022-07-08 ENCOUNTER — Other Ambulatory Visit: Payer: Self-pay | Admitting: Obstetrics and Gynecology

## 2022-07-08 DIAGNOSIS — Z803 Family history of malignant neoplasm of breast: Secondary | ICD-10-CM

## 2022-07-12 ENCOUNTER — Ambulatory Visit: Payer: Self-pay

## 2022-07-12 ENCOUNTER — Ambulatory Visit: Payer: 59 | Admitting: Family Medicine

## 2022-07-12 VITALS — BP 108/72 | Ht 66.0 in | Wt 176.0 lb

## 2022-07-12 DIAGNOSIS — M9908 Segmental and somatic dysfunction of rib cage: Secondary | ICD-10-CM

## 2022-07-12 DIAGNOSIS — M25511 Pain in right shoulder: Secondary | ICD-10-CM

## 2022-07-12 DIAGNOSIS — M545 Low back pain, unspecified: Secondary | ICD-10-CM

## 2022-07-12 DIAGNOSIS — M9901 Segmental and somatic dysfunction of cervical region: Secondary | ICD-10-CM | POA: Diagnosis not present

## 2022-07-12 DIAGNOSIS — M9904 Segmental and somatic dysfunction of sacral region: Secondary | ICD-10-CM

## 2022-07-12 DIAGNOSIS — M9902 Segmental and somatic dysfunction of thoracic region: Secondary | ICD-10-CM

## 2022-07-12 DIAGNOSIS — M9905 Segmental and somatic dysfunction of pelvic region: Secondary | ICD-10-CM

## 2022-07-12 DIAGNOSIS — M5030 Other cervical disc degeneration, unspecified cervical region: Secondary | ICD-10-CM

## 2022-07-12 DIAGNOSIS — M9903 Segmental and somatic dysfunction of lumbar region: Secondary | ICD-10-CM

## 2022-07-12 DIAGNOSIS — G8929 Other chronic pain: Secondary | ICD-10-CM

## 2022-07-12 DIAGNOSIS — M999 Biomechanical lesion, unspecified: Secondary | ICD-10-CM

## 2022-07-12 NOTE — Assessment & Plan Note (Signed)
Discussed HEP  Discussed that usually will take about 6 weeks till he sees again.  Degenerative disc disease of the cervical spine can continue to give her some difficulty.  We discussed taking a hiatus from any heavy lifting with some of the exercises until she is released by her surgeon.  Follow-up with me again in 6 to 8 weeks

## 2022-07-12 NOTE — Assessment & Plan Note (Signed)

## 2022-07-13 ENCOUNTER — Encounter: Payer: Self-pay | Admitting: Family Medicine

## 2022-08-06 HISTORY — PX: HERNIA REPAIR: SHX51

## 2022-08-24 ENCOUNTER — Telehealth: Payer: Self-pay | Admitting: Family Medicine

## 2022-08-24 NOTE — Telephone Encounter (Signed)
Sent patient MyChart message.

## 2022-08-24 NOTE — Telephone Encounter (Signed)
Patient called stating that yesterday she was leaning over in her car to grab something and her right knee cap dislocated and moved to the interior side. She said that stayed out for a minute or two and then she was able to get it back into place and it made a loud pop. She said that she is able to walk on it but it feels weak and not 100%. She does have a Helix stabilizing brace that she is wearing. She asked if she would be okay to wait until she is scheduled to see Dr Tamala Julian (09/08/22) or if she should have it looked at before?

## 2022-08-24 NOTE — Telephone Encounter (Signed)
If she is able to walk and uses the compression I think this would be okay.  I would ice it 20 minutes every 4 hours for some time being a couple days.  And if worsening in the next 48 hours have her call back otherwise she should be fine

## 2022-09-01 NOTE — Progress Notes (Signed)
[name] Phone: [phone] Subjective:    I'm seeing this patient by the request  of:  Waldemar Dickens, MD  CC: Back pain follow-up  [id number]  [name] is a 56 y.o. female coming in with complaint of back and neck pain. OMT 11/62023. Also f/u for R shoulder pain. Patient states overall doing relatively well.  Having right and left shoulder pain.  Has been trying to work with physical therapy as well as getting dry needling and massage.  States the last time with the manipulation was significantly helpful.        Reviewed prior external information including notes and imaging from previsou exam, outside providers and external EMR if available.   As well as notes that were available from care everywhere and other healthcare systems.  Past medical history, social, surgical and family history all reviewed in electronic medical record.  No pertanent information unless stated regarding to the chief complaint.   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 POSITIVE muscle aches, headache  Objective  Blood pressure 132/82, pulse 90, height '5\' 6"'$  (1.676 m), weight 186 lb (84.4 kg), SpO2 98 %.   General: No apparent distress alert and oriented x3 mood and affect normal, dressed appropriately.  HEENT: Pupils equal,  extraocular movements intact  Respiratory: Patient's speak in full sentences and does not appear short of breath  Cardiovascular: No lower extremity edema, non tender, no erythema  Stomach exam shows that patient does have what appears to be a potential seroma superior to the surgical site in the umbilicus.  Nontender though.  Osteopathic findings  C2 flexed rotated and side bent right C5 flexed rotated and side bent left T3 extended rotated and side bent left  inhaled rib T5 extended rotated and side bent left inhaled. L2 flexed rotated and side bent right Sacrum right on right       Assessment and Plan:  Degenerative cervical disc Degenerative disc disease that likely is contributing to some of the scapular dyskinesis.  Working with different providers which I do think is very helpful.  Doing great with the modalities.  Discussed which activities to do and which ones to avoid.  Increase activity slowly.  Follow-up again in 6 to 8 weeks.    Nonallopathic problems  Decision today to treat with OMT was based on Physical Exam  After verbal consent patient was treated with HVLA, ME, FPR techniques in cervical, rib, thoracic, lumbar, and sacral  areas  Patient tolerated the procedure well with improvement in symptoms  Patient given exercises, stretches and lifestyle modifications  See medications in patient instructions if given  Patient will follow up in 4-8 weeks     The above documentation has been reviewed and is accurate and complete Lyndal Pulley, DO  Note: This dictation was prepared with Dragon dictation along with smaller phrase technology. Any transcriptional errors that result from this process are unintentional.

## 2022-09-08 ENCOUNTER — Ambulatory Visit: Payer: 59 | Admitting: Family Medicine

## 2022-09-08 VITALS — BP 132/82 | HR 90 | Ht 66.0 in | Wt 186.0 lb

## 2022-09-08 DIAGNOSIS — M9903 Segmental and somatic dysfunction of lumbar region: Secondary | ICD-10-CM

## 2022-09-08 DIAGNOSIS — M9904 Segmental and somatic dysfunction of sacral region: Secondary | ICD-10-CM | POA: Diagnosis not present

## 2022-09-08 DIAGNOSIS — M5030 Other cervical disc degeneration, unspecified cervical region: Secondary | ICD-10-CM

## 2022-09-08 DIAGNOSIS — M9902 Segmental and somatic dysfunction of thoracic region: Secondary | ICD-10-CM | POA: Diagnosis not present

## 2022-09-08 DIAGNOSIS — M9908 Segmental and somatic dysfunction of rib cage: Secondary | ICD-10-CM | POA: Diagnosis not present

## 2022-09-08 DIAGNOSIS — M9901 Segmental and somatic dysfunction of cervical region: Secondary | ICD-10-CM | POA: Diagnosis not present

## 2022-09-08 NOTE — Patient Instructions (Signed)
Great to see you Happy new year Keep working with your team See me in 4-5 weeks

## 2022-09-08 NOTE — Assessment & Plan Note (Signed)
Degenerative disc disease that likely is contributing to some of the scapular dyskinesis.  Working with different providers which I do think is very helpful.  Doing great with the modalities.  Discussed which activities to do and which ones to avoid.  Increase activity slowly.  Follow-up again in 6 to 8 weeks.

## 2022-09-28 ENCOUNTER — Other Ambulatory Visit: Payer: Self-pay | Admitting: Family Medicine

## 2022-09-28 ENCOUNTER — Ambulatory Visit
Admission: RE | Admit: 2022-09-28 | Discharge: 2022-09-28 | Disposition: A | Discharge status: Home / Self Care | Payer: 59 | Source: Ambulatory Visit | Attending: Family Medicine | Admitting: Family Medicine

## 2022-09-28 DIAGNOSIS — R052 Subacute cough: Secondary | ICD-10-CM

## 2022-09-28 NOTE — Progress Notes (Signed)
Cough and respiratory

## 2022-10-07 NOTE — Progress Notes (Deleted)
  [place] Phone: [phone] Subjective:    I'm seeing this patient by the request  of:  Waldemar Dickens, MD  CC:   [id number]  [name] is a 57 y.o. female coming in with complaint of back and neck pain. OTM 09/08/2022. Patient states   Medications patient has been prescribed: None  Taking:         Reviewed prior external information including notes and imaging from previsou exam, outside providers and external EMR if available.   As well as notes that were available from care everywhere and other healthcare systems.  Past medical history, social, surgical and family history all reviewed in electronic medical record.  No pertanent information unless stated regarding to the chief complaint.   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 POSITIVE muscle aches  Objective  There were no vitals taken for this visit.   General: No apparent distress alert and oriented x3 mood and affect normal, dressed appropriately.  HEENT: Pupils equal, extraocular movements intact  Respiratory: Patient's speak in full sentences and does not appear short of breath  Cardiovascular: No lower extremity edema, non tender, no erythema  Gait MSK:  Back   Osteopathic findings  C2 flexed rotated and side bent right C6 flexed  rotated and side bent left T3 extended rotated and side bent right inhaled rib T9 extended rotated and side bent left L2 flexed rotated and side bent right Sacrum right on right       Assessment and Plan:  No problem-specific Assessment & Plan notes found for this encounter.    Nonallopathic problems  Decision today to treat with OMT was based on Physical Exam  After verbal consent patient was treated with HVLA, ME, FPR techniques in cervical, rib, thoracic, lumbar, and sacral  areas  Patient tolerated the procedure well with improvement in symptoms  Patient given exercises, stretches and lifestyle modifications  See medications in patient instructions if given  Patient will follow up in 4-8 weeks             Note: This dictation was prepared with Dragon dictation along with smaller phrase technology. Any transcriptional errors that result from this process are unintentional.

## 2022-10-13 ENCOUNTER — Ambulatory Visit: Payer: 59 | Admitting: Family Medicine

## 2022-10-14 NOTE — Progress Notes (Signed)
[place] [address] [place] Phone: [phone] Subjective:   [name], am serving as a scribe for Dr. Hulan Saas.  I'm seeing this patient by the request  of:  Waldemar Dickens, MD  CC: Neck and back pain follow-up  [id number]  [name] is a 57 y.o. female coming in with complaint of back and neck pain. OMT 09/08/2022. Patient states doing ok. Since last visit has been doing the best for the longest up until this past Wednesday. She went to PT and they really aggravated her left shoulder. No new issues.  Medications patient has been prescribed: None  Taking:         Reviewed prior external information including notes and imaging from previsou exam, outside providers and external EMR if available.   As well as notes that were available from care everywhere and other healthcare systems.  Past medical history, social, surgical and family history all reviewed in electronic medical record.  No pertanent information unless stated regarding to the chief complaint.   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 POSITIVE muscle aches  Objective  Blood pressure 116/74, pulse 95, height 5' 6"$  (1.676 m), weight 183 lb (83 kg), SpO2 95 %.   General: No apparent distress alert and oriented x3 mood and affect normal, dressed  appropriately.  HEENT: Pupils equal, extraocular movements intact  Respiratory: Patient's speak in full sentences and does not appear short of breath  Cardiovascular: No lower extremity edema, non tender, no erythema  Gait MSK:  Back   Osteopathic findings  C2 flexed rotated and side bent right C6 flexed rotated and side bent left  T3 extended rotated and side bent left inhaled rib L2 flexed rotated and side bent right L5 flexed rotated and side bent left Sacrum right on right       Assessment and Plan:  Degenerative cervical disc Significant arthritic changes noted.  Discussed with patient about sidebending.  Discussed icing regimen and home exercises.  Discussed FABER test left greater than right to be more tight as well but will monitor.  Will continue to work on core strengthening.  Patient hopefully will continue to make some improvement.  Did have significant improvement after the manipulation therapy after evaluation today.  Follow-up again in 6 to 8 weeks  Left shoulder pain Will continue to monitor.  Has had trouble with trigger points and can do injections or VAC injection again if necessary.    Nonallopathic problems  Decision today to treat with OMT was based on Physical Exam  After verbal consent patient was treated with HVLA, ME, FPR techniques in cervical, rib, thoracic, lumbar, and sacral  areas  Patient tolerated the procedure well with improvement in symptoms  Patient given exercises, stretches and lifestyle modifications  See medications in patient instructions  if given  Patient will follow up in 4-8 weeks     The above documentation has been reviewed and is accurate and complete Mary Pulley, DO         Note: This dictation was prepared with Dragon dictation along with smaller phrase technology. Any transcriptional errors that result from this process are unintentional.

## 2022-10-18 ENCOUNTER — Encounter: Payer: Self-pay | Admitting: Family Medicine

## 2022-10-18 ENCOUNTER — Ambulatory Visit: Payer: 59 | Admitting: Family Medicine

## 2022-10-18 VITALS — BP 116/74 | HR 95 | Ht 66.0 in | Wt 183.0 lb

## 2022-10-18 DIAGNOSIS — M9902 Segmental and somatic dysfunction of thoracic region: Secondary | ICD-10-CM | POA: Diagnosis not present

## 2022-10-18 DIAGNOSIS — M9904 Segmental and somatic dysfunction of sacral region: Secondary | ICD-10-CM

## 2022-10-18 DIAGNOSIS — M25512 Pain in left shoulder: Secondary | ICD-10-CM

## 2022-10-18 DIAGNOSIS — M9903 Segmental and somatic dysfunction of lumbar region: Secondary | ICD-10-CM

## 2022-10-18 DIAGNOSIS — M5030 Other cervical disc degeneration, unspecified cervical region: Secondary | ICD-10-CM

## 2022-10-18 DIAGNOSIS — M9908 Segmental and somatic dysfunction of rib cage: Secondary | ICD-10-CM

## 2022-10-18 DIAGNOSIS — G8929 Other chronic pain: Secondary | ICD-10-CM

## 2022-10-18 DIAGNOSIS — M9901 Segmental and somatic dysfunction of cervical region: Secondary | ICD-10-CM | POA: Diagnosis not present

## 2022-10-18 NOTE — Assessment & Plan Note (Signed)
Significant arthritic changes noted.  Discussed with patient about sidebending.  Discussed icing regimen and home exercises.  Discussed FABER test left greater than right to be more tight as well but will monitor.  Will continue to work on core strengthening.  Patient hopefully will continue to make some improvement.  Did have significant improvement after the manipulation therapy after evaluation today.  Follow-up again in 6 to 8 weeks

## 2022-10-18 NOTE — Patient Instructions (Signed)
Good to see you Okay to be active Keep hands in peripheral vision See you again in 6 weeks

## 2022-10-18 NOTE — Assessment & Plan Note (Signed)
Will continue to monitor.  Has had trouble with trigger points and can do injections or VAC injection again if necessary.

## 2022-11-01 ENCOUNTER — Ambulatory Visit
Admission: RE | Admit: 2022-11-01 | Discharge: 2022-11-01 | Disposition: A | Discharge status: Home / Self Care | Payer: 59 | Source: Ambulatory Visit | Attending: Obstetrics and Gynecology | Admitting: Obstetrics and Gynecology

## 2022-11-01 DIAGNOSIS — Z803 Family history of malignant neoplasm of breast: Secondary | ICD-10-CM

## 2022-11-01 MED ORDER — GADOPICLENOL 0.5 MMOL/ML IV SOLN
7.0000 mL | Freq: Once | INTRAVENOUS | Status: AC | PRN
  Administered 2022-11-01: 7 mL via INTRAVENOUS

## 2022-11-23 ENCOUNTER — Encounter: Payer: Self-pay | Admitting: Family Medicine

## 2022-11-23 ENCOUNTER — Ambulatory Visit: Payer: 59 | Admitting: Family Medicine

## 2022-11-23 ENCOUNTER — Other Ambulatory Visit: Payer: Self-pay

## 2022-11-23 VITALS — BP 112/78 | HR 76 | Ht 66.0 in | Wt 183.0 lb

## 2022-11-23 DIAGNOSIS — M79672 Pain in left foot: Secondary | ICD-10-CM | POA: Diagnosis not present

## 2022-11-23 DIAGNOSIS — S8392XA Sprain of unspecified site of left knee, initial encounter: Secondary | ICD-10-CM | POA: Diagnosis not present

## 2022-11-23 DIAGNOSIS — M9903 Segmental and somatic dysfunction of lumbar region: Secondary | ICD-10-CM

## 2022-11-23 DIAGNOSIS — M9901 Segmental and somatic dysfunction of cervical region: Secondary | ICD-10-CM

## 2022-11-23 DIAGNOSIS — M5030 Other cervical disc degeneration, unspecified cervical region: Secondary | ICD-10-CM | POA: Diagnosis not present

## 2022-11-23 DIAGNOSIS — M9904 Segmental and somatic dysfunction of sacral region: Secondary | ICD-10-CM

## 2022-11-23 DIAGNOSIS — M9902 Segmental and somatic dysfunction of thoracic region: Secondary | ICD-10-CM

## 2022-11-23 DIAGNOSIS — G5762 Lesion of plantar nerve, left lower limb: Secondary | ICD-10-CM | POA: Diagnosis not present

## 2022-11-23 DIAGNOSIS — M9908 Segmental and somatic dysfunction of rib cage: Secondary | ICD-10-CM | POA: Diagnosis not present

## 2022-11-23 NOTE — Assessment & Plan Note (Signed)
Chronic with mild exacerbation.  Seems to be secondary to potentially some compensation.  Discussed posture and ergonomics, discussed which activities to do and which ones to avoid.  Increase activity slowly otherwise.  Follow-up again in 6 to 8 weeks

## 2022-11-23 NOTE — Patient Instructions (Signed)
Do prescribed exercises at least 3x a week Ice after activity Avoid twisting PRP on thumb at next appointment on 18th

## 2022-11-23 NOTE — Assessment & Plan Note (Signed)
Seems to have a mild sprain noted.  Discussed which activities to do and which ones to avoid. Discussed with patient about topical anti-inflammatories Patient will monitor it and if any worsening pain seek medical attention but likely will do well with conservative therapy.

## 2022-11-23 NOTE — Assessment & Plan Note (Signed)
Patient is mated over 2 years since the last PRP and hopefully will do very well with that again.  We discussed that there are 2 different possibility of areas where patient could be having discomfort and pain and we will do PRP again at a later date

## 2022-11-23 NOTE — Progress Notes (Signed)
[place] Phone: [phone] Subjective:   [name], am serving as a scribe for Dr. Hulan Saas.  I'm seeing this patient by the request  of:  Waldemar Dickens, MD  CC: Foot pain, knee pain, back and neck pain  [id number]  [name] is a 57 y.o. female coming in with complaint of back and neck pain. OMT 10/18/2022. Patient states that shoulder is improving with physical therapy. Pain 1/10.   Patient states that her L foot neuroma is back. Under 2nd metatarsal. Constant pressure recently but pain started to increase 3 months ago. Would like to consider PRP again for foot.   Two weeks ago patient slipped on dog urine. Patient twisted her L knee and states that she felt she hyperextended it. Stairs are most difficult. Pain in patella. Iced for 3 days. Also feels like her L SI joint is out of alignment due to fall.   Medications patient has been prescribed:   Taking:         Reviewed prior external information including notes and imaging from previsou exam, outside providers and external EMR if available.   As well as notes that were available from care everywhere and other healthcare systems.  Past medical history, social, surgical and family history all reviewed in electronic medical record.  No pertanent information unless stated regarding to the chief complaint.   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 POSITIVE muscle aches  Objective  Blood pressure 112/78, pulse 76, height 5\' 6"  (1.676 m), weight 183 lb (83 kg), SpO2 98 %.   General: No apparent distress alert and oriented x3 mood and affect normal, dressed appropriately.  HEENT: Pupils equal, extraocular movements intact  Respiratory: Patient's speak in full sentences and does not appear short of breath  Cardiovascular: No lower extremity edema, non tender, no erythema  Medical Chelle shows pes planus noted with overpronation.  Patient does have a positive squeeze test on the left foot.  Tenderness to palpation on the dorsal aspect of the foot.  Neck exam still has significant loss of lordosis noted.  Patient is some tenderness to palpation noted.  Limited muscular skeletal ultrasound was performed and interpreted by Hulan Saas, M  Limited ultrasound shows some mild hypoechoic changes noted between the second and third and third and fourth metatarsal areas.  Left knee has a very small amount of hypoechoic changes consistent with a trace effusion noted.  Patient does have some narrowing of the patellofemoral joint noted. Impression: 2 neuromas of the left foot Also consistent knee sprain  Osteopathic findings  C2 flexed rotated and side bent right C5 flexed rotated and side bent left T3 extended rotated and side bent right inhaled rib T8 extended rotated and side bent left L2 flexed rotated and side  bent right L5 flexed rotated and side bent right Sacrum right on right       Assessment and Plan:  Morton's neuroma of left foot Patient is mated over 2 years since the last PRP and hopefully will do very well with that again.  We discussed that there are 2 different possibility of areas where patient could be having discomfort and pain and we will do PRP again at a later date  Degenerative cervical  disc Chronic with mild exacerbation.  Seems to be secondary to potentially some compensation.  Discussed posture and ergonomics, discussed which activities to do and which ones to avoid.  Increase activity slowly otherwise.  Follow-up again in 6 to 8 weeks  Left knee sprain Seems to have a mild sprain noted.  Discussed which activities to do and which ones to avoid. Discussed with patient about topical anti-inflammatories Patient will monitor it and if any worsening pain seek medical attention but likely will do well with conservative therapy.    Nonallopathic problems  Decision today to treat with OMT was based on Physical Exam  After verbal consent patient was treated with HVLA, ME, FPR techniques in cervical, rib, thoracic, lumbar, and sacral  areas  Patient tolerated the procedure well with improvement in symptoms  Patient given exercises, stretches and lifestyle modifications  See medications in patient instructions if given  Patient will follow up in 4-8 weeks     The above documentation has been reviewed and is accurate and complete Lyndal Pulley, DO         Note: This dictation was prepared with Dragon dictation along with smaller phrase technology. Any transcriptional errors that result from this process are unintentional.

## 2022-12-22 NOTE — Progress Notes (Unsigned)
  [facility] [address] [place] [address] Phone: [phone] Subjective:   [name], am serving as a scribe for Dr. Antoine Primas.  I'm seeing this patient by the request  of:  Ozella Rocks, MD  CC: Foot pain follow-up  [id number]  [name] is a 57 y.o. female coming in with complaint of L foot pain. Patient here for PRP for neuroma. Patient states that she has tingling at night but her pain is not at the intensity that it was previously.     Allergies  Allergen Reactions   Elemental Sulfur Hives   Sulfa Antibiotics Hives and Rash    Reaction not listed   Family History  Problem Relation Age of Onset   Breast cancer Mother 6       had hysterectomy at 90   Skin cancer Brother        had a few removed over lifetime, one was on chest.  Is now 73   Stomach cancer Maternal Grandmother    Lung cancer Paternal Grandmother 49       dx. early 57's died 72   Lung cancer Paternal Grandfather 74       dx in 105's, died at 4   Prostate cancer Maternal Uncle 40       died shortly after in 88's.  Metastatic- spread to brain and many other locations   Colon cancer Neg Hx    Esophageal cancer Neg Hx    Pancreatic cancer Neg Hx    Rectal cancer Neg Hx      Objective  Blood pressure 110/80, pulse 67, height  (1.676 m), weight 186 lb (84.4 kg), SpO2 98 %.   General: No apparent distress alert and oriented x3 mood and affect normal,  Procedure: Real-time Ultrasound Guided Injection of right foot neuroma Device: GE Logiq Q7 Ultrasound guided injection is preferred based studies that show increased duration, increased effect, greater accuracy, decreased procedural pain, increased response rate, and decreased cost with ultrasound guided versus blind injection.  Verbal informed consent obtained.  Time-out conducted.  Noted no overlying erythema, induration, or other signs of local infection.  Skin prepped in a sterile  fashion.  Local anesthesia: Topical Ethyl chloride.  With sterile technique and under real time ultrasound guidance: With a 21-gauge 2 inch needle injected with 0.5 cc of 0.5% Marcaine and then injected with 3 cc of PRP.  This was repeated in another neuroma in the second and third intermetatarsal spacing. Completed without difficulty  Pain immediately resolved suggesting accurate placement of the medication.  Advised to call if fevers/chills, erythema, induration, drainage, or persistent bleeding.  Impression: Technically successful ultrasound guided injection.   Impression and Recommendations:    The above documentation has been reviewed and is accurate and complete Judi Saa, DO

## 2022-12-23 ENCOUNTER — Encounter: Payer: Self-pay | Admitting: Family Medicine

## 2022-12-23 ENCOUNTER — Ambulatory Visit (INDEPENDENT_AMBULATORY_CARE_PROVIDER_SITE_OTHER): Payer: Self-pay | Admitting: Family Medicine

## 2022-12-23 ENCOUNTER — Ambulatory Visit: Payer: Self-pay

## 2022-12-23 ENCOUNTER — Ambulatory Visit: Payer: 59 | Admitting: Family Medicine

## 2022-12-23 VITALS — BP 110/80 | HR 67 | Ht 66.0 in | Wt 186.0 lb

## 2022-12-23 VITALS — Ht 66.0 in | Wt 186.0 lb

## 2022-12-23 DIAGNOSIS — M9901 Segmental and somatic dysfunction of cervical region: Secondary | ICD-10-CM | POA: Diagnosis not present

## 2022-12-23 DIAGNOSIS — M9904 Segmental and somatic dysfunction of sacral region: Secondary | ICD-10-CM | POA: Diagnosis not present

## 2022-12-23 DIAGNOSIS — M25512 Pain in left shoulder: Secondary | ICD-10-CM | POA: Diagnosis not present

## 2022-12-23 DIAGNOSIS — G8929 Other chronic pain: Secondary | ICD-10-CM | POA: Diagnosis not present

## 2022-12-23 DIAGNOSIS — G5762 Lesion of plantar nerve, left lower limb: Secondary | ICD-10-CM

## 2022-12-23 DIAGNOSIS — M9908 Segmental and somatic dysfunction of rib cage: Secondary | ICD-10-CM | POA: Diagnosis not present

## 2022-12-23 DIAGNOSIS — M9903 Segmental and somatic dysfunction of lumbar region: Secondary | ICD-10-CM

## 2022-12-23 DIAGNOSIS — M5030 Other cervical disc degeneration, unspecified cervical region: Secondary | ICD-10-CM

## 2022-12-23 DIAGNOSIS — M9902 Segmental and somatic dysfunction of thoracic region: Secondary | ICD-10-CM

## 2022-12-23 DIAGNOSIS — M79672 Pain in left foot: Secondary | ICD-10-CM

## 2022-12-23 NOTE — Patient Instructions (Signed)
No ice or IBU for 3 days Heat and Tylenol are ok See me again in 6 weeks 

## 2022-12-23 NOTE — Progress Notes (Signed)
[facility] [address] [place] [address] Phone: [phone] Subjective:    I'm seeing this patient by the request  of:  Ozella Rocks, MD  CC: Back and neck pain follow-up  [id number]  [name] is a 57 y.o. female coming in with complaint of back and neck pain. She walked a 5k this weekend and feels out of balance. Would like manipulation for the L hip and ankle pain.   Patient would like an injection in L shoulder. Pain in back of shoulder.   Medications patient has been prescribed:   Taking:         Reviewed prior external information including notes and imaging from previsou exam, outside providers and external EMR if available.   As well as notes that were available from care everywhere and other healthcare systems.  Past medical history, social, surgical and family history all reviewed in electronic medical record.  No pertanent information unless stated regarding to the chief complaint.   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 POSITIVE muscle aches  Objective  Height 5\' 6"  (1.676 m), weight 186 lb (84.4 kg).   General: No apparent distress alert and oriented x3 mood and affect normal, dressed appropriately.  HEENT: Pupils equal, extraocular movements intact  Respiratory: Patient's speak in full sentences and does not appear  short of breath  Cardiovascular: No lower extremity edema, non tender, no erythema  Patient neck exam does have some mild loss of lordosis.  Tightness noted on the right side of the paraspinal musculature.  Patient does have tightness in the parascapular area as well right greater than left.  Right shoulder does have significant tightness noted as well.  Severe tenderness to palpation in the left acromioclavicular joint.  Osteopathic findings  C2 flexed rotated and side bent right C6 flexed rotated and side bent left T3 extended rotated and side bent right inhaled rib T9 extended rotated and side bent left L2 flexed rotated and side bent right Sacrum right on right   Procedure: Real-time Ultrasound Guided Injection of left acromioclavicular joint Device: GE Logiq Q7 Ultrasound guided injection is preferred based studies that show increased duration, increased effect, greater accuracy, decreased procedural pain, increased response rate, and decreased cost with ultrasound guided versus blind injection.  Verbal informed consent obtained.  Time-out conducted.  Noted no overlying erythema, induration, or other signs of local infection.  Skin prepped in a sterile fashion.  Local anesthesia: Topical Ethyl chloride.  With sterile technique and under real time ultrasound guidance: With a 25-gauge half inch needle injected with 0.5 cc of 0.5% Marcaine and 0.5 cc of Kenalog 40 mg/mL Completed without difficulty  Pain immediately resolved suggesting accurate placement of the medication.  Advised to call if fevers/chills, erythema, induration, drainage, or persistent bleeding.  Impression: Technically successful ultrasound guided injection.  Assessment and Plan:  Acromioclavicular joint pain Patient given injection and tolerated the procedure well.  He did do very well for the last 6 months.  Hopefully this will make a significant improvement again.  Does have underlying degenerative disc  disease of the cervical spine will need to continue to monitor.  Follow-up again in 6 to 8 weeks  Degenerative cervical disc Chronic problem with exacerbation.  Discussed gabapentin and prednisone with patient traveling here in the near future.  Increase activity slowly otherwise.  Follow-up again in 6 to 8 weeks.    Nonallopathic problems  Decision today to treat with OMT was based on Physical Exam  After verbal consent patient was treated with HVLA, ME, FPR techniques in cervical, rib, thoracic, lumbar, and sacral  areas  Patient tolerated the procedure well with improvement in symptoms  Patient given exercises, stretches and lifestyle modifications  See medications in patient instructions if given  Patient will follow up in 4-8 weeks    The above documentation has been reviewed and is accurate and complete Judi Saa, DO          Note: This dictation was prepared with Dragon dictation along with smaller phrase technology. Any transcriptional errors that result from this process are unintentional.

## 2022-12-23 NOTE — Assessment & Plan Note (Signed)
Chronic problem with exacerbation.  Discussed gabapentin and prednisone with patient traveling here in the near future.  Increase activity slowly otherwise.  Follow-up again in 6 to 8 weeks.

## 2022-12-23 NOTE — Assessment & Plan Note (Signed)
Patient given injection and tolerated the procedure well.  He did do very well for the last 6 months.  Hopefully this will make a significant improvement again.  Does have underlying degenerative disc disease of the cervical spine will need to continue to monitor.  Follow-up again in 6 to 8 weeks

## 2022-12-23 NOTE — Assessment & Plan Note (Signed)
Repeat injection given today of the PRP.  Post PRP instructions given.  Patient had 2 areas of the injections today and hopefully this will be beneficial.  Discussed which activities to do and which ones to avoid, increase activity slowly otherwise.  Follow-up with me again in 6 to 8 weeks

## 2022-12-29 ENCOUNTER — Other Ambulatory Visit: Payer: 59

## 2023-01-10 NOTE — Progress Notes (Unsigned)
[facility] [address] [place] [address] Phone: [phone] Subjective:   Bruce Donath, am serving as a scribe for Dr. Antoine Primas.  I'm seeing this patient by the request  of:  Ozella Rocks, MD  CC: Back and neck pain follow-up  [id number]  [name] is a 57 y.o. female coming in with complaint of back and neck pain. OMT 12/23/2022. Also f/u for L shoulder pain. Patient states that she is doing a lot better. She did a lot of walking while traveling. Foot felt pretty good.   Medications patient has been prescribed:   Taking:         Reviewed prior external information including notes and imaging from previsou exam, outside providers and external EMR if available.   As well as notes that were available from care everywhere and other healthcare systems.  Past medical history, social, surgical and family history all reviewed in electronic medical record.  No pertanent information unless stated regarding to the chief complaint.   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 POSITIVE muscle aches  Objective  Blood pressure 112/74, pulse 76, height 5\' 6"  (1.676 m), weight 181 lb (82.1 kg), SpO2 97 %.   General: No apparent distress alert and oriented x3 mood and affect normal, dressed appropriately.  HEENT: Pupils equal, extraocular  movements intact  Respiratory: Patient's speak in full sentences and does not appear short of breath  Cardiovascular: No lower extremity edema, non tender, no erythema  Back exam does have some loss of lordosis noted.  Some tenderness to palpation in the paraspinal musculature.  Tightness with FABER bilaterally.  Some limitation of sidebending of the neck bilaterally.  Osteopathic findings  C2 flexed rotated and side bent right C7 flexed rotated and side bent left T3 extended rotated and side bent right inhaled rib T8 extended rotated and side bent left inhaled rib L2 flexed rotated and side bent right L3 flexed rotated and side bent left Sacrum right on right       Assessment and Plan:  Degenerative cervical disc Degenerative disc disease.  Discussed icing regimen and home exercises, discussed which activities to do and which ones to avoid, increase activity slowly.  Discussed continuing to increase activity and with patient now being more in town and less travel likely will be able to do so.  Follow-up with me again in 6 to 8 weeks otherwise    Nonallopathic problems  Decision today to treat with OMT was based on Physical Exam  After verbal consent patient was treated with HVLA, ME, FPR techniques in cervical, rib, thoracic, lumbar, and sacral  areas  Patient tolerated the procedure well with improvement in symptoms  Patient given exercises, stretches and lifestyle modifications  See medications in patient instructions if given  Patient will follow up in 4-8  weeks     The above documentation has been reviewed and is accurate and complete Lyndal Pulley, DO         Note: This dictation was prepared with Dragon dictation along with smaller phrase technology. Any transcriptional errors that result from this process are unintentional.

## 2023-01-12 ENCOUNTER — Ambulatory Visit: Payer: 59 | Admitting: Family Medicine

## 2023-01-12 VITALS — BP 112/74 | HR 76 | Ht 66.0 in | Wt 181.0 lb

## 2023-01-12 DIAGNOSIS — M9908 Segmental and somatic dysfunction of rib cage: Secondary | ICD-10-CM | POA: Diagnosis not present

## 2023-01-12 DIAGNOSIS — M9902 Segmental and somatic dysfunction of thoracic region: Secondary | ICD-10-CM

## 2023-01-12 DIAGNOSIS — M5030 Other cervical disc degeneration, unspecified cervical region: Secondary | ICD-10-CM | POA: Diagnosis not present

## 2023-01-12 DIAGNOSIS — M9904 Segmental and somatic dysfunction of sacral region: Secondary | ICD-10-CM

## 2023-01-12 DIAGNOSIS — M9903 Segmental and somatic dysfunction of lumbar region: Secondary | ICD-10-CM | POA: Diagnosis not present

## 2023-01-12 DIAGNOSIS — M9901 Segmental and somatic dysfunction of cervical region: Secondary | ICD-10-CM

## 2023-01-12 NOTE — Patient Instructions (Signed)
Doing much better Happy with where you are Have fun in [place] See me in 6-7 weeks

## 2023-01-12 NOTE — Assessment & Plan Note (Signed)
Degenerative disc disease.  Discussed icing regimen and home exercises, discussed which activities to do and which ones to avoid, increase activity slowly.  Discussed continuing to increase activity and with patient now being more in town and less travel likely will be able to do so.  Follow-up with me again in 6 to 8 weeks otherwise

## 2023-01-26 ENCOUNTER — Other Ambulatory Visit: Payer: Self-pay | Admitting: Internal Medicine

## 2023-01-26 DIAGNOSIS — R1030 Lower abdominal pain, unspecified: Secondary | ICD-10-CM

## 2023-02-01 ENCOUNTER — Ambulatory Visit
Admission: RE | Admit: 2023-02-01 | Discharge: 2023-02-01 | Disposition: A | Discharge status: Home / Self Care | Payer: 59 | Source: Ambulatory Visit | Attending: Internal Medicine | Admitting: Internal Medicine

## 2023-02-01 DIAGNOSIS — R1030 Lower abdominal pain, unspecified: Secondary | ICD-10-CM

## 2023-02-01 MED ORDER — IOPAMIDOL (ISOVUE-300) INJECTION 61%
100.0000 mL | Freq: Once | INTRAVENOUS | Status: AC | PRN
  Administered 2023-02-01: 100 mL via INTRAVENOUS

## 2023-02-21 NOTE — Progress Notes (Signed)
[facility] [address] [place] [address] Phone: [phone] Subjective:   INadine Counts, am serving as a scribe for Dr. Antoine Primas.  I'm seeing this patient by the request  of:  Ozella Rocks, MD  CC: Back and neck pain follow-up  [id number]  [name] is a 57 y.o. female coming in with complaint of back and neck pain. OMT 01/12/2023. Patient states same per usual. Would like check on L foot where she had PRP. Was better but is starting to feel numb and achy.  Medications patient has been prescribed:   Taking:         Reviewed prior external information including notes and imaging from previsou exam, outside providers and external EMR if available.   As well as notes that were available from care everywhere and other healthcare systems.  Past medical history, social, surgical and family history all reviewed in electronic medical record.  No pertanent information unless stated regarding to the chief complaint.   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 POSITIVE muscle aches  Objective  Blood pressure 106/72, pulse 89, height 5\' 6"  (1.676 m), weight 173 lb (78.5 kg), SpO2 97 %.   General: No apparent distress alert and oriented x3 mood and affect normal, dressed appropriately.  HEENT: Pupils equal, extraocular movements intact   Respiratory: Patient's speak in full sentences and does not appear short of breath  Cardiovascular: No lower extremity edema, non tender, no erythema  Gait mild antalgic favoring the left foot MSK:  Back does have some loss of lordosis noted.  Patient's neck does have some crepitus noted.  Negative Spurling's. Foot exam does have some tenderness to palpation and positive squeeze test noted.  Not as severe as previous exam but still has the pain.  Foot does have the breakdown of the longitudinal and transverse arch is noted.  Procedure: Real-time Ultrasound Guided Injection of left second and third neuroma Device: GE Logiq Q7 Ultrasound guided injection is preferred based studies that show increased duration, increased effect, greater accuracy, decreased procedural pain, increased response rate, and decreased cost with ultrasound guided versus blind injection.  Verbal informed consent obtained.  Time-out conducted.  Noted no overlying erythema, induration, or other signs of local infection.  Skin prepped in a sterile fashion.  Local anesthesia: Topical Ethyl chloride.  With sterile technique and under real time ultrasound guidance: With a 25-gauge half inch needle injected with 0.5 cc of 0.5% Marcaine and 0.5 cc of Kenalog 40 mg/mL Completed without difficulty  Pain immediately resolved suggesting accurate placement of the medication.  Advised to call if fevers/chills, erythema, induration, drainage, or persistent bleeding.  Impression: Technically successful ultrasound guided injection.  Osteopathic findings  C5 flexed rotated and side bent right C7 flexed rotated and side bent left T3 extended rotated and side  bent right inhaled rib T9 extended rotated and side bent left L1 flexed rotated and side bent right Sacrum right on right     Assessment and Plan:  Degenerative cervical disc Degenerative disc disease.  Discussed which activities to do and which ones to avoid, increase  activity slowly otherwise.  Patient does respond well to osteopathic manipulation so far.  Will continue to work on core strengthening.  Follow-up again in 6 to 8 weeks    Nonallopathic problems  Decision today to treat with OMT was based on Physical Exam  After verbal consent patient was treated with HVLA, ME, FPR techniques in cervical, rib, thoracic, lumbar, and sacral  areas  Patient tolerated the procedure well with improvement in symptoms  Patient given exercises, stretches and lifestyle modifications  See medications in patient instructions if given  Patient will follow up in 4-8 weeks    The above documentation has been reviewed and is accurate and complete Judi Saa, DO          Note: This dictation was prepared with Dragon dictation along with smaller phrase technology. Any transcriptional errors that result from this process are unintentional.

## 2023-02-28 ENCOUNTER — Ambulatory Visit: Payer: 59 | Admitting: Family Medicine

## 2023-02-28 ENCOUNTER — Other Ambulatory Visit: Payer: Self-pay

## 2023-02-28 VITALS — BP 106/72 | HR 89 | Ht 66.0 in | Wt 173.0 lb

## 2023-02-28 DIAGNOSIS — M9904 Segmental and somatic dysfunction of sacral region: Secondary | ICD-10-CM

## 2023-02-28 DIAGNOSIS — M9903 Segmental and somatic dysfunction of lumbar region: Secondary | ICD-10-CM

## 2023-02-28 DIAGNOSIS — G5762 Lesion of plantar nerve, left lower limb: Secondary | ICD-10-CM

## 2023-02-28 DIAGNOSIS — M5030 Other cervical disc degeneration, unspecified cervical region: Secondary | ICD-10-CM

## 2023-02-28 DIAGNOSIS — M9901 Segmental and somatic dysfunction of cervical region: Secondary | ICD-10-CM

## 2023-02-28 DIAGNOSIS — M79672 Pain in left foot: Secondary | ICD-10-CM | POA: Diagnosis not present

## 2023-02-28 DIAGNOSIS — M9902 Segmental and somatic dysfunction of thoracic region: Secondary | ICD-10-CM

## 2023-02-28 DIAGNOSIS — M9908 Segmental and somatic dysfunction of rib cage: Secondary | ICD-10-CM

## 2023-02-28 NOTE — Assessment & Plan Note (Signed)
Steroid injection given today secondary to the inflammation noted.  Discussed icing regimen of home exercises, discussed which activities to do and which ones to avoid.  Increase activity slowly over the course of next several weeks.  Follow-up with me again in 6 to 8 weeks.

## 2023-02-28 NOTE — Assessment & Plan Note (Signed)
Degenerative disc disease.  Discussed which activities to do and which ones to avoid, increase activity slowly otherwise.  Patient does respond well to osteopathic manipulation so far.  Will continue to work on core strengthening.  Follow-up again in 6 to 8 weeks

## 2023-02-28 NOTE — Patient Instructions (Addendum)
See you again in 10-12 weeks Ice after activity

## 2023-03-14 ENCOUNTER — Encounter: Payer: Self-pay | Admitting: Family Medicine

## 2023-03-14 ENCOUNTER — Other Ambulatory Visit: Payer: Self-pay

## 2023-03-14 ENCOUNTER — Telehealth: Payer: Self-pay

## 2023-03-14 ENCOUNTER — Ambulatory Visit: Payer: 59 | Admitting: Family Medicine

## 2023-03-14 ENCOUNTER — Ambulatory Visit (INDEPENDENT_AMBULATORY_CARE_PROVIDER_SITE_OTHER): Payer: 59

## 2023-03-14 VITALS — BP 124/82 | HR 90 | Ht 66.0 in | Wt 170.0 lb

## 2023-03-14 DIAGNOSIS — M25562 Pain in left knee: Secondary | ICD-10-CM

## 2023-03-14 DIAGNOSIS — M1712 Unilateral primary osteoarthritis, left knee: Secondary | ICD-10-CM

## 2023-03-14 DIAGNOSIS — G8929 Other chronic pain: Secondary | ICD-10-CM

## 2023-03-14 NOTE — Progress Notes (Unsigned)
   I, Stevenson Clinch, CMA acting as a scribe for Clementeen Graham, MD.  [name] is a 57 y.o. female who presents to [facility] at [facility] today for L knee pain. Pt was previously seen by Dr. Katrinka Blazing on 02/28/23 for L foot, L shoulder pain and OMT.  Today, pt c/o L knee pain x 2 months. ZOX:WRUE on wet spot on the floor at [place]. [place] landing on the knee. Pt locates pain to medial and lateral aspects. Swelling present. Pain with flexion and ambulation. Leaving the country next Wednesday for 2 weeks. Wearing Body Helix compression sleeve.   L Knee swelling: yes Mechanical symptoms: no Aggravates: flexion, ambulation Treatments tried: Body Helix knee compression, Arnica, ice, DNJO cream  Dx imaging: 11/13/19 Bilat standing knee XR  Pertinent review of systems: ***  Relevant historical information: ***   Exam:  LMP  (LMP Unknown)  General: Well Developed, well nourished, and in no acute distress.   MSK: ***    Lab and Radiology Results No results found for this or any previous visit (from the past 72 hour(s)). No results found.     Assessment and Plan: 57 y.o. female with ***   PDMP not reviewed this encounter. No orders of the defined types were placed in this encounter.  No orders of the defined types were placed in this encounter.    Discussed warning signs or symptoms. Please see discharge instructions. Patient expresses understanding.   ***

## 2023-03-14 NOTE — Telephone Encounter (Signed)
Check coverage for Visco for LEFT knee OA.

## 2023-03-14 NOTE — Telephone Encounter (Signed)
Check benefits for Zilretta for LEFT knee OA.

## 2023-03-14 NOTE — Patient Instructions (Addendum)
Thank you for coming in today.   Please get an Xray today before you leave   Please use Voltaren gel (Generic Diclofenac Gel) up to 4x daily for pain as needed.  This is available over-the-counter as both the name brand Voltaren gel and the generic diclofenac gel.   You should hear from MRI scheduling within 1 week. If you do not hear please let me know.    Have a fabulous trip!!

## 2023-03-15 NOTE — Telephone Encounter (Signed)
VOB initiated for Zilretta for LEFT knee OA 

## 2023-03-16 NOTE — Telephone Encounter (Signed)
VOB initiated for GELSYN-3 for LEFT knee OA (also checking Zilretta).

## 2023-03-17 NOTE — Telephone Encounter (Signed)
Did Dr Denyse Amass want to wait for the MRI before starting injections? (Zilretta and Gelsyn approved)

## 2023-03-17 NOTE — Telephone Encounter (Signed)
Forwarding to Dr. Denyse Amass to advise.   Per visit note 03/14/23:  Assessment and Plan: 57 y.o. female with acute exacerbation of left knee pain.  She injured her knee about 2 months ago and is still symptomatic.  She had a reexacerbation recently.  I am concerned she may have a meniscus tear or even a ACL tear.  Plan for steroid injection today to have temporary control of pain.  She is leaving the country for about 2 weeks.  Will go ahead and order an MRI and have that done when she gets back.  That should help determine options for her knee.  For now we will work on authorization of hyaluronic acid injections and Zilretta injections as a potential backup plan to proceed with injections before she leaves for her trip to [place] in September.

## 2023-03-17 NOTE — Telephone Encounter (Signed)
Gelsyn for LEFT knee OA (Also checking Zilretta)  Primary Insurance: [employer] Co-pay: $30 Gelsyn co-insurance: 15% Deductible: $8.06 of $500 met - must be met for coverage to apply Prior Auth NOT required

## 2023-03-17 NOTE — Telephone Encounter (Signed)
Zilretta for LEFT knee OA (Also checking Gelsyn)  Primary Insurance: [employer]  Co-pay: n/a Zilretta co-insurance: 0% Deductible: $$8.06 of $500 met - must be met for coverage to apply Prior Auth NOT required

## 2023-03-18 NOTE — Telephone Encounter (Signed)
Gelsyn for LEFT knee OA (Also checking Zilretta - HOLD until after MRI)   Primary Insurance: [employer] Co-pay: $30 Gelsyn co-insurance: 15% Deductible: $8.06 of $500 met - must be met for coverage to apply Prior Auth NOT required

## 2023-03-18 NOTE — Progress Notes (Signed)
Left knee x-ray shows mild arthritis.

## 2023-03-18 NOTE — Telephone Encounter (Signed)
I think we should do the MRI before the injections. The injections are a backup plan before you go on your trip this September.

## 2023-03-22 NOTE — Telephone Encounter (Signed)
Holding until after MRI (scheduled for 8/4).

## 2023-04-10 ENCOUNTER — Ambulatory Visit (INDEPENDENT_AMBULATORY_CARE_PROVIDER_SITE_OTHER): Payer: 59

## 2023-04-10 DIAGNOSIS — G8929 Other chronic pain: Secondary | ICD-10-CM | POA: Diagnosis not present

## 2023-04-10 DIAGNOSIS — M25562 Pain in left knee: Secondary | ICD-10-CM

## 2023-04-18 NOTE — Telephone Encounter (Signed)
Appt with Dr. Katrinka Blazing 04/27/23.   MRI completed, pending review.

## 2023-04-21 NOTE — Progress Notes (Signed)
[facility] [address] [place] [address] Phone: [phone] Subjective:   Bruce Donath, am serving as a scribe for Dr. Antoine Primas.  I'm seeing this patient by the request  of:  Ozella Rocks, MD  CC: knee pain follow up   [id number]  [name] is a 57 y.o. female coming in with complaint of back and neck pain. OMT 02/28/2023. F/u for L foot pain. Also saw Dr. Denyse Amass in July for L knee pain. Patient states that foot pain is improving. The knee is better with cortisone injection. Minimal pain. Has 2 trips coming up in September and would like visco injection injection.   L shoulder pain is 5/10 and is constant. Pain seems to be manageable at this time.   Would like skin checked. Distal tibia. Using lamisil over the area and it has not improved in 3 weeks.   Medications patient has been prescribed: None     Patient did have an MRI of the left knee showing the patient did have a mild MCL strain but was also found to have more advanced patellofemoral arthritis than anticipated.   Reviewed prior external information including notes and imaging from previsou exam, outside providers and external EMR if available.   As well as notes that were available from care everywhere and other healthcare systems.  Past medical history, social, surgical and family history all reviewed in electronic medical record.  No pertanent information unless stated regarding to the chief complaint.   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 POSITIVE muscle aches  Objective  Blood pressure 120/84, pulse 73, height 5\' 6"  (1.676 m), weight 160 lb (72.6 kg), SpO2 98%.   General: No apparent distress alert and oriented x3 mood and affect normal, dressed appropriately.  HEENT: Pupils equal, extraocular movements intact  Respiratory: Patient's speak in full sentences and does not appear short of breath  Cardiovascular: No lower extremity edema, non tender, no erythema  Neck exam still has some loss of lordosis.  Some tenderness noted in the paraspinal musculature but nothing severe.  Negative Spurling's. Left shoulder does have positive impingement noted.  5 out of 5 strength of the rotator cuff.  Knee exam bilaterally do have crepitus.  Does have some worsening crepitus though on the right side but more tenderness on the left side of the knee.  Osteopathic findings  C2 flexed rotated and side bent right T3 extended rotated and side bent right inhaled rib L2 flexed rotated and side bent right Sacrum right on right  Procedure: Real-time Ultrasound Guided Injection of left glenohumeral joint Device: GE Logiq E  Ultrasound guided injection is preferred based studies that show increased duration, increased effect, greater accuracy, decreased procedural pain, increased response rate with ultrasound guided versus blind injection.  Verbal informed consent obtained.  Time-out  conducted.  Noted no overlying erythema, induration, or other signs of local infection.  Skin prepped in a sterile fashion.  Local anesthesia: Topical Ethyl chloride.  With sterile technique and under real time ultrasound guidance:  Joint visualized.  21g 2 inch needle inserted posterior approach. Pictures taken for needle placement. Patient did have injection of 2 cc of 0.5%  Marcaine, and 1cc of Kenalog 40 mg/dL. Completed without difficulty  Pain immediately resolved suggesting accurate placement of the medication.  Advised to call if fevers/chills, erythema, induration, drainage, or persistent bleeding.  Impression: Technically successful ultrasound guided injection.  After informed written and verbal consent, patient was seated on exam table. Left knee was prepped with alcohol swab and utilizing anterolateral approach, patient's left knee space was injected with 60 mg per 3 mL of Durolane (sodium hyaluronate) in a prefilled syringe was injected easily into the knee through a 22-gauge needle..Patient tolerated the procedure well without immediate complications.     Assessment and Plan:  No problem-specific Assessment & Plan notes found for this encounter.    Nonallopathic problems  Decision today to treat with OMT was based on Physical Exam  After verbal consent patient was treated with HVLA, ME, FPR techniques in cervical, rib, thoracic, lumbar, and sacral  areas  Patient tolerated the procedure well with improvement in symptoms  Patient given exercises, stretches and lifestyle modifications  See medications in patient instructions if given  Patient will follow up in 4-8 weeks     The above documentation has been reviewed and is accurate and complete Judi Saa, DO         Note: This dictation was prepared with Dragon dictation along with smaller phrase technology. Any transcriptional errors that result from this process are unintentional.

## 2023-04-25 NOTE — Progress Notes (Signed)
Left knee MRI shows a mild MCL strain that will get better on its own and arthritis especially worse underneath the kneecap that is going to be a more difficult problem to fix. Will continue to work on authorization for the gel shots and for Zilretta injection.  We should do 1 of these before your trip in September.

## 2023-04-27 ENCOUNTER — Ambulatory Visit: Payer: 59 | Admitting: Family Medicine

## 2023-04-27 ENCOUNTER — Encounter: Payer: Self-pay | Admitting: Family Medicine

## 2023-04-27 ENCOUNTER — Other Ambulatory Visit: Payer: Self-pay

## 2023-04-27 VITALS — BP 120/84 | HR 73 | Ht 66.0 in | Wt 160.0 lb

## 2023-04-27 DIAGNOSIS — M9908 Segmental and somatic dysfunction of rib cage: Secondary | ICD-10-CM

## 2023-04-27 DIAGNOSIS — M1712 Unilateral primary osteoarthritis, left knee: Secondary | ICD-10-CM | POA: Diagnosis not present

## 2023-04-27 DIAGNOSIS — M25512 Pain in left shoulder: Secondary | ICD-10-CM | POA: Diagnosis not present

## 2023-04-27 DIAGNOSIS — M9901 Segmental and somatic dysfunction of cervical region: Secondary | ICD-10-CM

## 2023-04-27 DIAGNOSIS — G8929 Other chronic pain: Secondary | ICD-10-CM

## 2023-04-27 DIAGNOSIS — M9904 Segmental and somatic dysfunction of sacral region: Secondary | ICD-10-CM | POA: Diagnosis not present

## 2023-04-27 DIAGNOSIS — M5030 Other cervical disc degeneration, unspecified cervical region: Secondary | ICD-10-CM

## 2023-04-27 DIAGNOSIS — M170 Bilateral primary osteoarthritis of knee: Secondary | ICD-10-CM | POA: Insufficient documentation

## 2023-04-27 DIAGNOSIS — M9903 Segmental and somatic dysfunction of lumbar region: Secondary | ICD-10-CM | POA: Diagnosis not present

## 2023-04-27 DIAGNOSIS — M79672 Pain in left foot: Secondary | ICD-10-CM

## 2023-04-27 DIAGNOSIS — M9902 Segmental and somatic dysfunction of thoracic region: Secondary | ICD-10-CM

## 2023-04-27 MED ORDER — SODIUM HYALURONATE 60 MG/3ML IX PRSY
60.0000 mg | PREFILLED_SYRINGE | Freq: Once | INTRA_ARTICULAR | Status: AC
  Administered 2023-04-27: 60 mg via INTRA_ARTICULAR

## 2023-04-27 NOTE — Assessment & Plan Note (Signed)
Chronic problem but seems to be exacerbated recently.  MRI did show the patellofemoral arthritis.  Patient states avoid any type of surgical intervention.  Discussed which activities to do and which ones to avoid.  Increase activity slowly otherwise.  Follow-up again in 6 to 8 weeks.  Could be a candidate for PRP or even steroid injections if necessary.  Hopefully viscosupplementation make significant improvement long-term.

## 2023-04-27 NOTE — Patient Instructions (Addendum)
Visco injection today for L knee Injected L shoulder Have a great trip! See me again in 6 weeks

## 2023-04-27 NOTE — Assessment & Plan Note (Signed)
Chronic problem that likely has some exacerbation secondary to the tenderness.  Discussed which activities to do and which ones to avoid.  Increase activity slowly.  Follow-up again in 6 to 8 weeks

## 2023-04-27 NOTE — Assessment & Plan Note (Signed)
Chronic problem with not responding at the moment.  MRI of the shoulder on the left side previously only centered at tendinopathy.  Given injection and hopefully this will be beneficial.  Warned of potential side effects.  Discussed icing regimen.  Follow-up again in 6 to 8 weeks otherwise.

## 2023-05-02 NOTE — Telephone Encounter (Signed)
Pt received Durolane injectio on 04/27/23 with Dr. Katrinka Blazing.

## 2023-05-03 NOTE — Telephone Encounter (Signed)
Pt received Durolane for LEFT knee OA on 04/27/23. Can consider repeat injection on or after 10/29/23.

## 2023-05-18 ENCOUNTER — Other Ambulatory Visit: Payer: Self-pay | Admitting: Obstetrics and Gynecology

## 2023-05-18 DIAGNOSIS — Z1231 Encounter for screening mammogram for malignant neoplasm of breast: Secondary | ICD-10-CM

## 2023-05-27 NOTE — Progress Notes (Unsigned)
    Aleen Sells D.[facility] [address] [place] [address] Phone: [phone]   Assessment and Plan:     There are no diagnoses linked to this encounter.  ***   Pertinent previous records reviewed include ***   Follow Up: ***     Subjective:   I, [name], am serving as a Neurosurgeon for Doctor Richardean Sale  Chief Complaint: left shoulder pain   HPI:   05/30/2023 Patient is a 57 year old female complaining of left shoulder pain. Patient states  Relevant Historical Information: ***  Additional pertinent review of systems negative.   Current Outpatient Medications:    ARMOUR THYROID PO, Take 60 mg by mouth 2 (two) times daily. , Disp: , Rfl:    cholecalciferol (VITAMIN D) 400 units TABS tablet, Take 400 Units by mouth., Disp: , Rfl:    fexofenadine (ALLEGRA) 60 MG tablet, Take 60 mg by mouth once., Disp: , Rfl:    levocetirizine (XYZAL) 5 MG tablet, Take 5 mg by mouth every evening., Disp: , Rfl:    liothyronine (CYTOMEL) 25 MCG tablet, Take 50 mcg by mouth 2 (two) times daily. , Disp: , Rfl: 3   MAGNESIUM CITRATE PO, Take 1 tablet by mouth daily., Disp: , Rfl:    Multiple Vitamins-Minerals (MULTIVITAMIN ADULT PO), Take 1 tablet by mouth daily., Disp: , Rfl:    Probiotic Product (PROBIOTIC PO), Take 1 tablet by mouth daily., Disp: , Rfl:    progesterone (PROMETRIUM) 200 MG capsule, Take 400 mg by mouth 2 (two) times daily., Disp: , Rfl:    Vitamin D, Ergocalciferol, (DRISDOL) 50000 units CAPS capsule, TAKE ONE CAPSULE WEEKLY, Disp: 12 capsule, Rfl: 0   Objective:     There were no vitals filed for this visit.    There is no height or weight on file to calculate BMI.    Physical Exam:    ***   Electronically signed by:  Aleen Sells [facility] 7:56 AM 05/27/23

## 2023-05-30 ENCOUNTER — Ambulatory Visit: Payer: 59 | Admitting: Sports Medicine

## 2023-05-30 VITALS — BP 126/82 | HR 88 | Ht 66.0 in | Wt 157.0 lb

## 2023-05-30 DIAGNOSIS — M25512 Pain in left shoulder: Secondary | ICD-10-CM | POA: Diagnosis not present

## 2023-05-30 DIAGNOSIS — M546 Pain in thoracic spine: Secondary | ICD-10-CM | POA: Diagnosis not present

## 2023-05-30 DIAGNOSIS — M9902 Segmental and somatic dysfunction of thoracic region: Secondary | ICD-10-CM

## 2023-05-30 DIAGNOSIS — M9908 Segmental and somatic dysfunction of rib cage: Secondary | ICD-10-CM

## 2023-05-30 DIAGNOSIS — G8929 Other chronic pain: Secondary | ICD-10-CM

## 2023-05-30 DIAGNOSIS — M9901 Segmental and somatic dysfunction of cervical region: Secondary | ICD-10-CM | POA: Diagnosis not present

## 2023-05-30 MED ORDER — MELOXICAM 15 MG PO TABS: 15.0000 mg | ORAL_TABLET | Freq: Every day | ORAL | 0 refills | Status: DC

## 2023-05-30 NOTE — Patient Instructions (Signed)
-   Start meloxicam 15 mg daily x2 weeks.  May use remaining meloxicam as needed once daily for pain control.  Do not to use additional NSAIDs while taking meloxicam.  May use Tylenol [phone] mg 2 to 3 times a day for breakthrough pain. 2 week follow up with Dr. Katrinka Blazing

## 2023-06-10 NOTE — Progress Notes (Unsigned)
[facility] [address] [place] [address] Phone: [phone] Subjective:   Bruce Donath, am serving as a scribe for Dr. Antoine Primas.  I'm seeing this patient by the request  of:  Ozella Rocks, MD  CC: Back and neck pain follow-up  [id number]  [name] is a 57 y.o. female coming in with complaint of back and neck pain. OMT 04/18/2023. Patient states that she had increase in pain in L shoulder due to rib pain from recent travel.  Still having some left shoulder pain but nothing severe.  Feet seem to be doing well, knees seem to be doing okay.  Medications patient has been prescribed: None  Taking:         Reviewed prior external information including notes and imaging from previsou exam, outside providers and external EMR if available.   As well as notes that were available from care everywhere and other healthcare systems.  Past medical history, social, surgical and family history all reviewed in electronic medical record.  No pertanent information unless stated regarding to the chief complaint.   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 POSITIVE muscle aches  Objective  Blood pressure 124/84, pulse 84, height 5\' 6"  (1.676 m), weight 150 lb (68 kg), SpO2 97%.   General: No apparent distress alert and oriented x3 mood and  affect normal, dressed appropriately.  HEENT: Pupils equal, extraocular movements intact  Respiratory: Patient's speak in full sentences and does not appear short of breath  Cardiovascular: No lower extremity edema, non tender, no erythema  Gait relatively normal MSK:  Back does have some tenderness to palpation of the paraspinal musculature.  Tightness noted with FABER test right greater than left. Neck exam still has some tenderness to palpation with sidebending bilaterally.  Osteopathic findings  C2 flexed rotated and side bent right C7 flexed rotated and side bent left T3 extended rotated and side bent right inhaled rib T7 extended rotated and side bent left L2 flexed rotated and side bent right Sacrum right on right       Assessment and Plan:  Degenerative cervical disc Known degenerative disc disease but has responded well to the osteopathic ambulation.  I do think patient did very well with all of her increasing activity.  Discussed which activities to do and which ones to avoid.  Increase activity slowly.  Discussed icing regimen and home exercises, increase activity slowly otherwise.  Follow-up with me again in 6 to 8 weeks    Nonallopathic problems  Decision today to treat with OMT was based on Physical Exam  After verbal consent patient was treated with HVLA, ME, FPR techniques in cervical, rib, thoracic, lumbar, and sacral  areas  Patient tolerated the procedure well with improvement in symptoms  Patient given exercises, stretches and lifestyle modifications  See medications  in patient instructions if given  Patient will follow up in 4-8 weeks     The above documentation has been reviewed and is accurate and complete Judi Saa, DO         Note: This dictation was prepared with Dragon dictation along with smaller phrase technology. Any transcriptional errors that result from this process are unintentional.

## 2023-06-13 ENCOUNTER — Ambulatory Visit: Payer: 59 | Admitting: Family Medicine

## 2023-06-13 ENCOUNTER — Encounter: Payer: Self-pay | Admitting: Family Medicine

## 2023-06-13 VITALS — BP 124/84 | HR 84 | Ht 66.0 in | Wt 150.0 lb

## 2023-06-13 DIAGNOSIS — M9902 Segmental and somatic dysfunction of thoracic region: Secondary | ICD-10-CM

## 2023-06-13 DIAGNOSIS — M9904 Segmental and somatic dysfunction of sacral region: Secondary | ICD-10-CM | POA: Diagnosis not present

## 2023-06-13 DIAGNOSIS — M9903 Segmental and somatic dysfunction of lumbar region: Secondary | ICD-10-CM | POA: Diagnosis not present

## 2023-06-13 DIAGNOSIS — M9901 Segmental and somatic dysfunction of cervical region: Secondary | ICD-10-CM | POA: Diagnosis not present

## 2023-06-13 DIAGNOSIS — M5030 Other cervical disc degeneration, unspecified cervical region: Secondary | ICD-10-CM | POA: Diagnosis not present

## 2023-06-13 DIAGNOSIS — M9908 Segmental and somatic dysfunction of rib cage: Secondary | ICD-10-CM

## 2023-06-13 NOTE — Assessment & Plan Note (Signed)
Known degenerative disc disease but has responded well to the osteopathic ambulation.  I do think patient did very well with all of her increasing activity.  Discussed which activities to do and which ones to avoid.  Increase activity slowly.  Discussed icing regimen and home exercises, increase activity slowly otherwise.  Follow-up with me again in 6 to 8 weeks

## 2023-06-13 NOTE — Patient Instructions (Signed)
Glad you are doing well See me again in 7-8 weeks

## 2023-07-06 ENCOUNTER — Ambulatory Visit
Admission: RE | Admit: 2023-07-06 | Discharge: 2023-07-06 | Disposition: A | Discharge status: Home / Self Care | Payer: 59 | Source: Ambulatory Visit | Attending: Obstetrics and Gynecology | Admitting: Obstetrics and Gynecology

## 2023-07-06 DIAGNOSIS — Z1231 Encounter for screening mammogram for malignant neoplasm of breast: Secondary | ICD-10-CM

## 2023-07-26 NOTE — Progress Notes (Unsigned)
[facility] [address] [place] [address] Phone: [phone] Subjective:   [name], am serving as a scribe for Dr. Antoine Primas.  I'm seeing this patient by the request  of:  Ozella Rocks, MD  CC: back and neck pain follow up   [id number]  [name] is a 57 y.o. female coming in with complaint of back and neck pain. OMT 06/13/2023. Patient states same per usual. May need injection in L shoulder, last one Aug 2024. No other concerns.  Medications patient has been prescribed: None           Reviewed prior external information including notes and imaging from previsou exam, outside providers and external EMR if available.   As well as notes that were available from care everywhere and other healthcare systems.  Past medical history, social, surgical and family history all reviewed in electronic medical record.  No pertanent information unless stated regarding to the chief complaint.   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 POSITIVE muscle aches  Objective  Blood pressure 126/84, pulse 92, height 5\' 6"  (1.676 m), weight 145 lb (65.8 kg), SpO2 96%.   General: No apparent distress alert and oriented x3 mood and affect normal, dressed appropriately.  HEENT: Pupils equal, extraocular movements intact  Respiratory: Patient's  speak in full sentences and does not appear short of breath  Cardiovascular: No lower extremity edema, non tender, no erythema  Gait MSK:  Back does have some loss lordosis noted.  Some tenderness to palpation mostly around the neck noted.  Discussed icing regimen and home exercises.  Osteopathic findings  C3 flexed rotated and side bent right C7 flexed rotated and side bent left T3 extended rotated and side bent right inhaled rib T9 extended rotated and side bent left L2 flexed rotated and side bent right Sacrum right on right  After informed written and verbal consent, patient was seated on exam table. Left shoulder was prepped with alcohol swab and utilizing posterior approach, patient's left glenohumeral space was injected with 4:1  marcaine 0.5%: Kenalog 40mg /dL. Patient tolerated the procedure well without immediate complications.  After verbal consent patient was prepped with alcohol swab and with a 25-gauge half inch needle injected into the left acromioclavicular joint.  Total of 0.5 cc of 0.5% Marcaine and 0.5 cc of Kenalog 40 mg/mL used.  No blood loss.  Band-Aid placed.  Postinjection instructions given   Assessment and Plan:  Acromioclavicular joint pain Patient given injection and tolerated the procedure well, discussed icing regimen and home exercises.  Hopeful that this will make significant improvement in her patient's pain.  Will be very distracted with patient's father on hospice.  Follow-up with me again in 6 to 8 weeks  Degenerative cervical disc Degenerative disc disease, does have more tightness noted of the neck  and the upper back likely some of it secondary to exacerbation with stress.  Discussed which activities to do and which ones to avoid.  Follow-up with me again in 6 to 8 weeks.  Left shoulder pain Chronic problem with exacerbation.  Discussed which activities to do and which ones to avoid.  Follow-up again in 6 to 8 weeks.    Nonallopathic  problems  Decision today to treat with OMT was based on Physical Exam  After verbal consent patient was treated with HVLA, ME, FPR techniques in cervical, rib, thoracic, lumbar, and sacral  areas  Patient tolerated the procedure well with improvement in symptoms  Patient given exercises, stretches and lifestyle modifications  See medications in patient instructions if given  Patient will follow up in 4-8 weeks    The above documentation has been reviewed and is accurate and complete Judi Saa, DO          Note: This dictation was prepared with Dragon dictation along with smaller phrase technology. Any transcriptional errors that result from this process are unintentional.

## 2023-07-27 ENCOUNTER — Encounter: Payer: Self-pay | Admitting: Family Medicine

## 2023-07-27 ENCOUNTER — Ambulatory Visit: Payer: 59 | Admitting: Family Medicine

## 2023-07-27 VITALS — BP 126/84 | HR 92 | Ht 66.0 in | Wt 145.0 lb

## 2023-07-27 DIAGNOSIS — M5030 Other cervical disc degeneration, unspecified cervical region: Secondary | ICD-10-CM | POA: Diagnosis not present

## 2023-07-27 DIAGNOSIS — M9908 Segmental and somatic dysfunction of rib cage: Secondary | ICD-10-CM

## 2023-07-27 DIAGNOSIS — M9901 Segmental and somatic dysfunction of cervical region: Secondary | ICD-10-CM | POA: Diagnosis not present

## 2023-07-27 DIAGNOSIS — G8929 Other chronic pain: Secondary | ICD-10-CM

## 2023-07-27 DIAGNOSIS — M9902 Segmental and somatic dysfunction of thoracic region: Secondary | ICD-10-CM

## 2023-07-27 DIAGNOSIS — M9904 Segmental and somatic dysfunction of sacral region: Secondary | ICD-10-CM

## 2023-07-27 DIAGNOSIS — M25512 Pain in left shoulder: Secondary | ICD-10-CM

## 2023-07-27 DIAGNOSIS — M9903 Segmental and somatic dysfunction of lumbar region: Secondary | ICD-10-CM

## 2023-07-27 NOTE — Assessment & Plan Note (Signed)
Chronic problem with exacerbation.  Discussed which activities to do and which ones to avoid.  Follow-up again in 6 to 8 weeks.

## 2023-07-27 NOTE — Assessment & Plan Note (Signed)
Patient given injection and tolerated the procedure well, discussed icing regimen and home exercises.  Hopeful that this will make significant improvement in her patient's pain.  Will be very distracted with patient's father on hospice.  Follow-up with me again in 6 to 8 weeks

## 2023-07-27 NOTE — Patient Instructions (Signed)
Good to see you! Happy Holidays See you again in 6-8 weeks

## 2023-07-27 NOTE — Assessment & Plan Note (Signed)
Degenerative disc disease, does have more tightness noted of the neck and the upper back likely some of it secondary to exacerbation with stress.  Discussed which activities to do and which ones to avoid.  Follow-up with me again in 6 to 8 weeks.

## 2023-08-22 NOTE — Progress Notes (Unsigned)
  [facility] [address] [place] [address] Phone: [phone] Subjective:   INadine Counts, am serving as a scribe for Dr. Antoine Primas.  I'm seeing this patient by the request  of:  Ozella Rocks, MD  CC: Back and neck pain follow-up  [id number]  [name] is a 57 y.o. female coming in with complaint of back and neck pain. OMT 07/27/2023. Also f/u for L shoulder pain. Patient states same per usual. Has had a bunch a knots between shoulder blades. Went to massage therapist and doing better. No new concerns.  Medications patient has been prescribed: None  Taking:         Reviewed prior external information including notes and imaging from previsou exam, outside providers and external EMR if available.   As well as notes that were available from care everywhere and other healthcare systems.  Past medical history, social, surgical and family history all reviewed in electronic medical record.  No pertanent information unless stated regarding to the chief complaint.   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 POSITIVE muscle aches  Objective  Blood pressure 112/82, pulse 80, height 5\' 6"  (1.676 m), weight 144 lb (65.3 kg), SpO2 99%.   General: No apparent distress alert and oriented x3 mood and affect normal, dressed appropriately.   HEENT: Pupils equal, extraocular movements intact  Respiratory: Patient's speak in full sentences and does not appear short of breath  Cardiovascular: No lower extremity edema, non tender, no erythema  Gait relatively normal MSK:  Back does have some loss of lordosis noted.  Some tenderness to palpation of the paraspinal musculature.  Osteopathic findings  C2 flexed rotated and side bent right C5 flexed rotated and side bent left C7 flexed rotated and side bent left T5 extended rotated and side bent left inhaled rib L2 flexed rotated and side bent right Sacrum right on right       Assessment and Plan:  No problem-specific Assessment & Plan notes found for this encounter.    Nonallopathic problems  Decision today to treat with OMT was based on Physical Exam  After verbal consent patient was treated with HVLA, ME, FPR techniques in cervical, rib, thoracic, lumbar, and sacral  areas  Patient tolerated the procedure well with improvement in symptoms  Patient given exercises, stretches and lifestyle modifications  See medications in patient instructions if given  Patient will follow up in 4-8 weeks     The above documentation has been reviewed and is accurate and complete Judi Saa, DO         Note: This dictation was prepared with Dragon dictation along with smaller phrase technology. Any transcriptional errors that result from this process are unintentional.

## 2023-08-24 ENCOUNTER — Ambulatory Visit: Payer: 59 | Admitting: Family Medicine

## 2023-08-24 ENCOUNTER — Encounter: Payer: Self-pay | Admitting: Family Medicine

## 2023-08-24 VITALS — BP 112/82 | HR 80 | Ht 66.0 in | Wt 144.0 lb

## 2023-08-24 DIAGNOSIS — M9901 Segmental and somatic dysfunction of cervical region: Secondary | ICD-10-CM | POA: Diagnosis not present

## 2023-08-24 DIAGNOSIS — M9908 Segmental and somatic dysfunction of rib cage: Secondary | ICD-10-CM | POA: Diagnosis not present

## 2023-08-24 DIAGNOSIS — M5030 Other cervical disc degeneration, unspecified cervical region: Secondary | ICD-10-CM | POA: Diagnosis not present

## 2023-08-24 DIAGNOSIS — M9904 Segmental and somatic dysfunction of sacral region: Secondary | ICD-10-CM

## 2023-08-24 DIAGNOSIS — M9902 Segmental and somatic dysfunction of thoracic region: Secondary | ICD-10-CM | POA: Diagnosis not present

## 2023-08-24 DIAGNOSIS — M9903 Segmental and somatic dysfunction of lumbar region: Secondary | ICD-10-CM

## 2023-08-24 NOTE — Patient Instructions (Signed)
Good to see you! Doing wonderful Let's see you beginning of February to see how first 30 days are going

## 2023-08-24 NOTE — Assessment & Plan Note (Signed)
Continues some tightness noted.  He recently did have a loss of her family.  Patient is doing remarkably well with that though.  Discussed with patient about continuing to home exercises and icing regimen.  Increase activity slowly otherwise.  Follow-up again in 6 to 8 weeks

## 2023-08-25 ENCOUNTER — Other Ambulatory Visit: Payer: Self-pay | Admitting: Family Medicine

## 2023-08-25 MED ORDER — AZITHROMYCIN 500 MG PO TABS: 500.0000 mg | ORAL_TABLET | Freq: Every day | ORAL | 0 refills | Status: DC

## 2023-09-22 NOTE — Progress Notes (Signed)
[facility] [address] [place] [address] Phone: [phone] Subjective:   INadine Counts, am serving as a scribe for Dr. Antoine Primas.  I'm seeing this patient by the request  of:  Ozella Rocks, MD  CC: Back and neck pain follow-up  [id number]  [name] is a 58 y.o. female coming in with complaint of back and neck pain. OMT 08/24/2023. Patient states same per usual. Talk TMJ.  Patient feels like she is in a much better place than usual.  Has been doing very well with more balance in her life.  Feels like that has helped the pain.  Very minimal pain at this time.  Medications patient has been prescribed: None       Reviewed prior external information including notes and imaging from previsou exam, outside providers and external EMR if available.   As well as notes that were available from care everywhere and other healthcare systems.  Past medical history, social, surgical and family history all reviewed in electronic medical record.  No pertanent information unless stated regarding to the chief complaint.   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 POSITIVE muscle aches  Objective  Blood pressure 112/76, pulse 93, height 5\' 6"  (1.676 m), weight 142 lb (64.4 kg), SpO2 97%.   General: No apparent distress alert and oriented x3 mood  and affect normal, dressed appropriately.  HEENT: Pupils equal, extraocular movements intact  Respiratory: Patient's speak in full sentences and does not appear short of breath  Cardiovascular: No lower extremity edema, non tender, no erythema  Gait MSK:  Back does have some loss lordosis.  Neck exam does have some limited sidebending.  Patient does have some crepitus noted. Patient is tender to palpation over the TMJs even with herself palpating her own.  Osteopathic findings  C2 flexed rotated and side bent right C6 flexed rotated and side bent right T3 extended rotated and side bent right inhaled rib T9 extended rotated and side bent left L2 flexed rotated and side bent right Sacrum right on right       Assessment and Plan:  Neck muscle spasm Patient has done remarkably well.  He is having more tightness noted in the neck a little bit but no significant pain.  Patient responded extremely well to osteopathic manipulation.  Doing very well at the moment with less stress.  Very happy with the result so far.  Follow-up again in 6 to 8 weeks  TMJ arthralgia Patient is having difficulty opening her mouth.  Was seen by her regular dentist given name and number to a specialist TMJ.    Nonallopathic problems  Decision today to treat with OMT was based on Physical Exam  After verbal consent patient was treated with HVLA, ME, FPR techniques in cervical, rib, thoracic, lumbar, and sacral  areas  Patient tolerated the procedure well with improvement in symptoms  Patient given exercises, stretches and lifestyle modifications  See medications in patient instructions if given  Patient will follow up in 4-8 weeks     The above documentation has been reviewed and is accurate and complete Judi Saa, DO         Note: This dictation was prepared with Dragon dictation along with smaller phrase technology. Any transcriptional errors that result from this process are  unintentional.

## 2023-09-28 ENCOUNTER — Ambulatory Visit: Payer: 59 | Admitting: Family Medicine

## 2023-09-28 ENCOUNTER — Encounter: Payer: Self-pay | Admitting: Family Medicine

## 2023-09-28 VITALS — BP 112/76 | HR 93 | Ht 66.0 in | Wt 142.0 lb

## 2023-09-28 DIAGNOSIS — M9903 Segmental and somatic dysfunction of lumbar region: Secondary | ICD-10-CM

## 2023-09-28 DIAGNOSIS — M62838 Other muscle spasm: Secondary | ICD-10-CM | POA: Diagnosis not present

## 2023-09-28 DIAGNOSIS — M9908 Segmental and somatic dysfunction of rib cage: Secondary | ICD-10-CM

## 2023-09-28 DIAGNOSIS — M26623 Arthralgia of bilateral temporomandibular joint: Secondary | ICD-10-CM | POA: Diagnosis not present

## 2023-09-28 DIAGNOSIS — M9901 Segmental and somatic dysfunction of cervical region: Secondary | ICD-10-CM

## 2023-09-28 DIAGNOSIS — M9904 Segmental and somatic dysfunction of sacral region: Secondary | ICD-10-CM | POA: Diagnosis not present

## 2023-09-28 DIAGNOSIS — M9902 Segmental and somatic dysfunction of thoracic region: Secondary | ICD-10-CM | POA: Diagnosis not present

## 2023-09-28 DIAGNOSIS — M26629 Arthralgia of temporomandibular joint, unspecified side: Secondary | ICD-10-CM | POA: Insufficient documentation

## 2023-09-28 NOTE — Assessment & Plan Note (Signed)
Patient is having difficulty opening her mouth.  Was seen by her regular dentist given name and number to a specialist TMJ.

## 2023-09-28 NOTE — Patient Instructions (Signed)
Good to see you! So glad where you are right now See you again in 6-8 weeks

## 2023-09-28 NOTE — Assessment & Plan Note (Signed)
Patient has done remarkably well.  He is having more tightness noted in the neck a little bit but no significant pain.  Patient responded extremely well to osteopathic manipulation.  Doing very well at the moment with less stress.  Very happy with the result so far.  Follow-up again in 6 to 8 weeks

## 2023-10-10 NOTE — Telephone Encounter (Signed)
VOB initiated for Durolane for LEFT knee OA

## 2023-10-11 NOTE — Telephone Encounter (Addendum)
Medical [employer] and [employer] St[employer]  Prior Auth for [facility] APPROVED PA#  [id number] Valid: 10/11/23-10/10/24        Pharmacy Benefit  Prior Auth for [facility] APPROVED [facility] [place] PA# [id number] Valid: 10/11/23-10/11/23

## 2023-10-11 NOTE — Telephone Encounter (Signed)
 DUROLANE for LEFT knee OA  OK to schedule on or after 10/29/23  Medical Benefit  Primary Insurance: [facility] Choice Plus POS Co-pay: $30 Co-insurance: 15% Deductible: $0 of $500 met - must be met for coverage to apply Prior Auth: APPROVED PA#     [id number] Valid: 10/11/23-10/10/24  Pharmacy Benefit  Primary Insurance: [facility] / [employer] Prior Auth APPROVED PA# [id number] Valid: 10/11/23-10/11/23   Knee Injection History 03/14/23 - Kenalog  LEFT 04/27/23 - Durolane LEFT

## 2023-10-11 NOTE — Telephone Encounter (Signed)
 Holding until needed

## 2023-10-11 NOTE — Telephone Encounter (Signed)
 [employer] - Prior Authorization NOT required  Pharmacy Benefit - Prior Authorization REQUIRED

## 2023-10-14 ENCOUNTER — Telehealth: Payer: Self-pay

## 2023-10-14 NOTE — Telephone Encounter (Signed)
 Patient is scheduled 11/14/23  DUROLANE for LEFT knee OA  OK to schedule on or after 10/29/23  Medical Benefit  Primary Insurance: [facility] Choice Plus POS Co-pay: $30 Co-insurance: 15% Deductible: $0 of $500 met - must be met for coverage to apply Prior Auth: APPROVED PA# [id number] Valid: 10/11/23-10/10/24  Pharmacy Benefit  Primary Insurance: [facility] / [employer] Prior Auth APPROVED PA# [id number] Valid: 10/11/23-10/11/23

## 2023-10-14 NOTE — Telephone Encounter (Signed)
 Noted.

## 2023-10-24 ENCOUNTER — Other Ambulatory Visit: Payer: Self-pay | Admitting: Family Medicine

## 2023-10-24 DIAGNOSIS — K117 Disturbances of salivary secretion: Secondary | ICD-10-CM

## 2023-10-24 DIAGNOSIS — R4189 Other symptoms and signs involving cognitive functions and awareness: Secondary | ICD-10-CM

## 2023-10-24 DIAGNOSIS — R413 Other amnesia: Secondary | ICD-10-CM

## 2023-10-24 NOTE — Addendum Note (Signed)
Addended by: Ozella Rocks on: 10/24/2023 04:33 PM   Modules accepted: Orders

## 2023-10-24 NOTE — Progress Notes (Signed)
8 mo of increasing bilat jaw tension w/ associated difficulty w/ mastication and now speech. L>>>R.  Intermittent drooling R side of mouth. Occasional word searching.

## 2023-10-24 NOTE — Addendum Note (Signed)
Addended by: Ozella Rocks on: 10/24/2023 04:22 PM   Modules accepted: Orders

## 2023-10-25 ENCOUNTER — Ambulatory Visit
Admission: RE | Admit: 2023-10-25 | Discharge: 2023-10-25 | Disposition: A | Discharge status: Home / Self Care | Payer: 59 | Source: Ambulatory Visit | Attending: Family Medicine | Admitting: Family Medicine

## 2023-10-25 DIAGNOSIS — K117 Disturbances of salivary secretion: Secondary | ICD-10-CM

## 2023-10-25 DIAGNOSIS — R4189 Other symptoms and signs involving cognitive functions and awareness: Secondary | ICD-10-CM

## 2023-10-25 DIAGNOSIS — R413 Other amnesia: Secondary | ICD-10-CM

## 2023-10-25 MED ORDER — IOPAMIDOL (ISOVUE-300) INJECTION 61%
100.0000 mL | Freq: Once | INTRAVENOUS | Status: AC | PRN
  Administered 2023-10-25: 75 mL via INTRAVENOUS

## 2023-11-11 NOTE — Progress Notes (Signed)
 [facility] [address] [place] [address] Phone: [phone] Subjective:    I'm seeing this patient by the request  of:  Ozella Rocks, MD  CC: Multiple joint complaint follow-up  [id number]  [name] is a 58 y.o. female coming in with complaint of foot, shoulder and knee pain. Patient here for PRP for foot. Patient states shoulder injection and OMT today. PRP in March 24th.       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   Family history of breast cancer    Family history of prostate cancer    Interstitial cystitis    mild   Neoplasm of appendix    Thyroid disease    Past Surgical History:  Procedure Laterality Date   APPENDECTOMY  08/2006   BREAST CYST ASPIRATION Right 09/2020   BREAST CYST ASPIRATION Right 2018   COLONOSCOPY  04/17/2007   TUBAL LIGATION  11/1998   UMBILICAL HERNIA REPAIR  10/2016   Social History   Socioeconomic History   Marital status: Married    Spouse name: Not on file   Number of children: 2   Years of education: Not on file   Highest education level: Not on file  Occupational History   Not on file  Tobacco Use   Smoking status: Never   Smokeless tobacco: Never  Vaping Use   Vaping status: Never Used  Substance and Sexual Activity   Alcohol use: Yes    Alcohol/week: 5.0 standard drinks of alcohol    Types: 5 Glasses of wine per week    Comment: occasional   Drug use: No   Sexual activity: Not on file  Other Topics Concern   Not on file  Social History Narrative   Not on file   Social Drivers of Health   Financial Resource Strain: Not on file  Food Insecurity: Not on file  Transportation Needs: Not on file  Physical Activity: Not on file  Stress: Not on file  Social Connections: Not on file   Allergies  Allergen Reactions   Elemental Sulfur Hives   Sulfa Antibiotics Hives and Rash    Reaction not listed    Family History  Problem Relation Age of Onset   Breast cancer Mother 56       had hysterectomy at 63   Prostate cancer Maternal Uncle 70       died shortly after in 37's.  Metastatic- spread to brain and many other locations   Stomach cancer Maternal Grandmother    Lung cancer Paternal Grandmother 56       dx. early 85's died 77   Lung cancer Paternal Grandfather 53       dx in 52's, died at 48   Breast cancer Cousin 30   Skin cancer Brother        had a few removed over lifetime, one was on chest.  Is now 37   Breast cancer Maternal Aunt    Breast cancer Maternal Aunt    Colon cancer Neg Hx    Esophageal cancer Neg Hx    Pancreatic cancer Neg Hx    Rectal cancer Neg Hx     Current Outpatient Medications (Endocrine & Metabolic):    ARMOUR THYROID PO, Take 60 mg by mouth 2 (two) times daily.    liothyronine (CYTOMEL) 25 MCG tablet, Take 50 mcg by mouth 2 (two) times daily.  progesterone (PROMETRIUM) 200 MG capsule, Take 400 mg by mouth 2 (two) times daily.   Current Outpatient Medications (Respiratory):    fexofenadine (ALLEGRA) 60 MG tablet, Take 60 mg by mouth once.   levocetirizine (XYZAL) 5 MG tablet, Take 5 mg by mouth every evening.  Current Outpatient Medications (Analgesics):    meloxicam (MOBIC) 15 MG tablet, Take 1 tablet (15 mg total) by mouth daily.   Current Outpatient Medications (Other):    azithromycin (ZITHROMAX) 500 MG tablet, Take 1 tablet (500 mg total) by mouth daily.   cholecalciferol (VITAMIN D) 400 units TABS tablet, Take 400 Units by mouth.   MAGNESIUM CITRATE PO*, Take 1 tablet by mouth daily.   Multiple Vitamins-Minerals (MULTIVITAMIN ADULT PO), Take 1 tablet by mouth daily.   Probiotic Product (PROBIOTIC PO), Take 1 tablet by mouth daily.   Vitamin D, Ergocalciferol, (DRISDOL) 50000 units CAPS capsule, TAKE ONE CAPSULE WEEKLY * These medications belong to multiple therapeutic classes and are listed under each applicable group.   Reviewed  prior external information including notes and imaging from  primary care provider As well as notes that were available from care everywhere and other healthcare systems.  Past medical history, social, surgical and family history all reviewed in electronic medical record.  No pertanent information unless stated regarding to the chief complaint.   Review of Systems:  No headache, visual changes, nausea, vomiting, diarrhea, constipation, dizziness, abdominal pain, skin rash, fevers, chills, night sweats, weight loss, swollen lymph nodes, body aches, joint swelling, chest pain, shortness of breath, mood changes. POSITIVE muscle aches  Objective  Blood pressure 116/74, pulse 66, height 5\' 6"  (1.676 m), weight 145 lb (65.8 kg), SpO2 98%.   General: No apparent distress alert and oriented x3 mood and affect normal, dressed appropriately.  HEENT: Pupils equal, extraocular movements intact  Respiratory: Patient's speak in full sentences and does not appear short of breath  Cardiovascular: No lower extremity edema, non tender, no erythema  Patient's left shoulder does have significant impingement noted.  Patient does have tightness noted of the neck with sidebending bilaterally.  Patient does have tightness noted in the lower back and seems to be right greater than left.  Osteopathic findings \ C2 flexed rotated and side bent right C4 flexed rotated and side bent left C6 flexed rotated and side bent left T3 extended rotated and side bent right inhaled third rib T9 extended rotated and side bent left L2 flexed rotated and side bent right Sacrum right on right   After informed written and verbal consent, patient was seated on exam table. Left shoulder was prepped with alcohol swab and utilizing posterior approach, patient's right glenohumeral space was injected with 4:1  marcaine 0.5%: Kenalog 40mg /dL. Patient tolerated the procedure well without immediate complications.   Impression and  Recommendations:    Left shoulder pain Patient given injection and tolerated the procedure well, has responded to this in the past as well.  Hopeful that patient will make significant progress.  Discussed icing regimen and home exercises, discussed medications and patient has been on prednisone previously.  Follow-up again in 6 to 8 weeks otherwise.  Morton neuroma, left Consider possible PRP again.    Decision today to treat with OMT was based on Physical Exam  After verbal consent patient was treated with HVLA, ME, FPR techniques in cervical, thoracic, rib, lumbar and sacral areas, all areas are chronic   Patient tolerated the procedure well with improvement in symptoms  Patient given exercises, stretches and  lifestyle modifications  See medications in patient instructions if given  Patient will follow up in 4-8 weeks   The above documentation has been reviewed and is accurate and complete Judi Saa, DO

## 2023-11-14 ENCOUNTER — Ambulatory Visit: Admitting: Family Medicine

## 2023-11-14 ENCOUNTER — Ambulatory Visit: Payer: 59 | Admitting: Family Medicine

## 2023-11-14 ENCOUNTER — Encounter: Payer: Self-pay | Admitting: Family Medicine

## 2023-11-14 VITALS — BP 116/74 | HR 66 | Ht 66.0 in | Wt 145.0 lb

## 2023-11-14 DIAGNOSIS — M9902 Segmental and somatic dysfunction of thoracic region: Secondary | ICD-10-CM

## 2023-11-14 DIAGNOSIS — G5762 Lesion of plantar nerve, left lower limb: Secondary | ICD-10-CM

## 2023-11-14 DIAGNOSIS — M9904 Segmental and somatic dysfunction of sacral region: Secondary | ICD-10-CM | POA: Diagnosis not present

## 2023-11-14 DIAGNOSIS — M9908 Segmental and somatic dysfunction of rib cage: Secondary | ICD-10-CM | POA: Diagnosis not present

## 2023-11-14 DIAGNOSIS — G8929 Other chronic pain: Secondary | ICD-10-CM

## 2023-11-14 DIAGNOSIS — M5030 Other cervical disc degeneration, unspecified cervical region: Secondary | ICD-10-CM

## 2023-11-14 DIAGNOSIS — M9903 Segmental and somatic dysfunction of lumbar region: Secondary | ICD-10-CM

## 2023-11-14 DIAGNOSIS — M25512 Pain in left shoulder: Secondary | ICD-10-CM

## 2023-11-14 DIAGNOSIS — M9901 Segmental and somatic dysfunction of cervical region: Secondary | ICD-10-CM | POA: Diagnosis not present

## 2023-11-14 NOTE — Assessment & Plan Note (Signed)
 Consider possible PRP again.

## 2023-11-14 NOTE — Patient Instructions (Signed)
 MSK appointment in 6-8 weeks Like strength training, but keeps hands in peripheral vision Gel approval for other knee

## 2023-11-14 NOTE — Assessment & Plan Note (Signed)
 Patient given injection and tolerated the procedure well, has responded to this in the past as well.  Hopeful that patient will make significant progress.  Discussed icing regimen and home exercises, discussed medications and patient has been on prednisone previously.  Follow-up again in 6 to 8 weeks otherwise.

## 2023-11-14 NOTE — Assessment & Plan Note (Signed)
 Known arthritic changes of the neck.  Discussed posture and ergonomics.  Responds well to manipulation.  Follow-up again in 6 to 8 weeks

## 2023-11-21 ENCOUNTER — Ambulatory Visit
Admission: RE | Admit: 2023-11-21 | Discharge: 2023-11-21 | Disposition: A | Discharge status: Home / Self Care | Payer: 59 | Source: Ambulatory Visit | Attending: Family Medicine | Admitting: Family Medicine

## 2023-11-21 DIAGNOSIS — R4189 Other symptoms and signs involving cognitive functions and awareness: Secondary | ICD-10-CM

## 2023-11-21 DIAGNOSIS — K117 Disturbances of salivary secretion: Secondary | ICD-10-CM

## 2023-11-21 DIAGNOSIS — R413 Other amnesia: Secondary | ICD-10-CM

## 2023-11-21 MED ORDER — IOPAMIDOL (ISOVUE-370) INJECTION 76%
80.0000 mL | Freq: Once | INTRAVENOUS | Status: AC | PRN
  Administered 2023-11-21: 80 mL via INTRAVENOUS

## 2023-11-28 ENCOUNTER — Ambulatory Visit: Payer: Self-pay | Admitting: Family Medicine

## 2023-11-28 ENCOUNTER — Other Ambulatory Visit: Payer: Self-pay

## 2023-11-28 ENCOUNTER — Telehealth: Payer: Self-pay

## 2023-11-28 VITALS — BP 118/78 | HR 74 | Ht 66.0 in

## 2023-11-28 DIAGNOSIS — M79672 Pain in left foot: Secondary | ICD-10-CM

## 2023-11-28 DIAGNOSIS — G5762 Lesion of plantar nerve, left lower limb: Secondary | ICD-10-CM

## 2023-11-28 NOTE — Telephone Encounter (Signed)
 Patient needs an appointment once medication is stocked.   Durolane approved for right knee.  Patient has a self funded [employer] POS calendar year plan with an effective date of 09/07/2023 to 12/312025. Plan follows [employer] guidelines. Plan covers at 85% of allowable amount for Durolane [id number] and 100% of allowable amount for procedures 20610/20611. Deductible must be met before coverage applies. Patient has a $30 copay whether, or not an office visit is billed. Only one copay applies per date of service. If out of pocket is met, coverage goes to 100% and copay will no longer apply. No pre-cert and no referrals needed. Medical notes must be submitted with the claim. Include medical necessity, diagnosis, and all clinicals

## 2023-11-28 NOTE — Patient Instructions (Signed)
 No Ice or IBU Heat and Tylenol are ok See me again in 6-8 weeks

## 2023-11-28 NOTE — Progress Notes (Unsigned)
  [facility] [address] [place] [address] Phone: [phone] Subjective:   Bruce Donath, am serving as a scribe for Dr. Antoine Primas.  I'm seeing this patient by the request  of:  Ozella Rocks, MD  CC: Foot pain  [id number]  TONILYNN BIEKER is a 58 y.o. female coming in with complaint of foot pain.  Has had PRP for neuromas previously.    Procedure: Real-time Ultrasound Guided Injection of left fourth and fifth neuromas Device: GE Logiq Q7 Ultrasound guided injection is preferred based studies that show increased duration, increased effect, greater accuracy, decreased procedural pain, increased response rate, and decreased cost with ultrasound guided versus blind injection.  Verbal informed consent obtained.  Time-out conducted.  Noted no overlying erythema, induration, or other signs of local infection.  Skin prepped in a sterile fashion.  Local anesthesia: Topical Ethyl chloride.  With sterile technique and under real time ultrasound guidance: With a 21-gauge 2 inch needle injected initially with 0.5 cc of 0.5% Marcaine and then injected with 3 cc of PRP into the third fourth intermetatarsal space approximately where the neuroma was noted.  This was then repeated for another 3 cc in the fourth and fifth intermetatarsal space. Completed without difficulty  Pain immediately resolved suggesting accurate placement of the medication.  Advised to call if fevers/chills, erythema, induration, drainage, or persistent bleeding.  Impression: Technically successful ultrasound guided injection.         Note: This dictation was prepared with Dragon dictation along with smaller phrase technology. Any transcriptional errors that result from this process are unintentional.

## 2023-11-29 ENCOUNTER — Encounter: Payer: Self-pay | Admitting: Family Medicine

## 2023-11-29 NOTE — Assessment & Plan Note (Signed)
Post PRP handout given.

## 2023-11-29 NOTE — Telephone Encounter (Signed)
 Added to 5/5 appointment note.

## 2024-01-05 NOTE — Progress Notes (Addendum)
 [name] [address] [place] [address] Phone: [phone] Subjective:   IBryan Caprio, am serving as a scribe for Dr. Ronnell Coins.  I'm seeing this patient by the request  of:  [name], MD  CC: right knee and back pain follow up   [id number]  [name] is a 58 y.o. female coming in with complaint of R knee pain. Durolane approved for R knee. Patient states bilateral knee injections and OMT today. Injection in R shoulder not doing well like the time before. Slowly getting better.       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   Family history of breast cancer    Family history of prostate cancer    Interstitial cystitis    mild   Neoplasm of appendix    Thyroid  disease    Past Surgical History:  Procedure Laterality Date   APPENDECTOMY  08/2006   BREAST CYST ASPIRATION Right 09/2020   BREAST CYST ASPIRATION Right 2018   COLONOSCOPY  04/17/2007   TUBAL LIGATION  11/1998   UMBILICAL HERNIA REPAIR  10/2016   Social History   Socioeconomic History   Marital status: Married    Spouse name: Not on file   Number of children: 2   Years of education: Not on file   Highest education level: Not on file  Occupational History   Not on file  Tobacco Use   Smoking status: Never   Smokeless tobacco: Never  Vaping Use   Vaping status: Never Used  Substance and Sexual Activity   Alcohol use: Yes    Alcohol/week: 5.0 standard drinks of alcohol    Types: 5 Glasses of wine per week    Comment: occasional   Drug use: No   Sexual activity: Not on file  Other Topics Concern   Not on file  Social History Narrative   Not on file   Social Drivers of Health   Financial Resource Strain: Not on file  Food Insecurity: Not on file  Transportation Needs: Not on file  Physical Activity: Not on file  Stress: Not on file  Social Connections: Not on file    Allergies  Allergen Reactions   Elemental Sulfur Hives   Sulfa Antibiotics Hives and Rash    Reaction not listed   Family History  Problem Relation Age of Onset   Breast cancer Mother 68       had hysterectomy at 53   Prostate cancer Maternal Uncle 70       died shortly after in 54's.  Metastatic- spread to brain and many other locations   Stomach cancer Maternal Grandmother    Lung cancer Paternal Grandmother 48       dx. early 29's died 49   Lung cancer Paternal Grandfather 69       dx in 90's, died at 44   Breast cancer Cousin 30   Skin cancer Brother        had a few removed over lifetime, one was on chest.  Is now 62   Breast cancer Maternal Aunt    Breast cancer Maternal Aunt    Colon cancer Neg Hx    Esophageal cancer Neg Hx    Pancreatic cancer Neg Hx    Rectal cancer Neg Hx     Current Outpatient Medications (Endocrine & Metabolic):    ARMOUR THYROID  PO, Take 60 mg by  mouth 2 (two) times daily.    progesterone  (PROMETRIUM ) 200 MG capsule, Take 400 mg by mouth 2 (two) times daily.   Current Outpatient Medications (Respiratory):    fexofenadine (ALLEGRA) 60 MG tablet, Take 60 mg by mouth once.  Current Outpatient Medications (Analgesics):    meloxicam  (MOBIC ) 15 MG tablet, Take 1 tablet (15 mg total) by mouth daily.   Current Outpatient Medications (Other):    cholecalciferol (VITAMIN D ) 400 units TABS tablet, Take 400 Units by mouth.   MAGNESIUM CITRATE PO*, Take 1 tablet by mouth daily.   Multiple Vitamins-Minerals (MULTIVITAMIN ADULT PO), Take 1 tablet by mouth daily.   Probiotic Product (PROBIOTIC PO), Take 1 tablet by mouth daily. * These medications belong to multiple therapeutic classes and are listed under each applicable group.   Reviewed prior external information including notes and imaging from  primary care provider As well as notes that were available from care everywhere and other healthcare systems.  Past medical history, social, surgical  and family history all reviewed in electronic medical record.  No pertanent information unless stated regarding to the chief complaint.   Review of Systems:  No headache, visual changes, nausea, vomiting, diarrhea, constipation, dizziness, abdominal pain, skin rash, fevers, chills, night sweats, weight loss, swollen lymph nodes, body aches, joint swelling, chest pain, shortness of breath, mood changes. POSITIVE muscle aches  Objective  Blood pressure 112/80, pulse 92, height 5\' 6"  (1.676 m), weight 141 lb (64 kg), SpO2 98%.   General: No apparent distress alert and oriented x3 mood and affect normal, dressed appropriately.  HEENT: Pupils equal, extraocular movements intact  Respiratory: Patient's speak in full sentences and does not appear short of breath  Cardiovascular: No lower extremity edema, non tender, no erythema  Right knee exam shows crepitus noted at the patellofemoral joint bilaterally though actually. Back exam shows patient does have some tightness noted at the neck especially with sidebending bilaterally.  Seems to be both the left and the right area.  Osteopathic findings C2 flexed rotated and side bent right C4 flexed rotated and side bent left C6 flexed rotated and side bent left T3 extended rotated and side bent right inhaled third rib T9 extended rotated and side bent left L2 flexed rotated and side bent right Sacrum right on right  After informed written and verbal consent, patient was seated on exam table. Right knee was prepped with alcohol swab and utilizing anterolateral approach, patient's right knee space was injected with 60 mg per 3 mL of Durolane (sodium hyaluronate) in a prefilled syringe was injected easily into the knee through a 22-gauge needle..Patient tolerated the procedure well without immediate complications.    After informed written and verbal consent, patient was seated on exam table. Left knee was prepped with alcohol swab and utilizing  anterolateral approach, patient's left knee space was injected with 60 mg per 3 mL of Durolane (sodium hyaluronate) in a prefilled syringe was injected easily into the knee through a 22-gauge needle..Patient tolerated the procedure well without immediate complications.  After verbal consent patient was prepped with alcohol swab and with a 25-gauge half inch needle injected into the acromioclavicular joint with a total of 0.5 cc of 0.5% Marcaine and 0.5 cc of Kenalog  40 mg/mL.  No blood loss.  Band-Aid placed.  Postinjection instructions given Impression and Recommendations:  Degenerative arthritis of left knee Chronic, with worsening symptoms.  Has responded well to viscosupplementation previously.  9 months out from previous injections.  Hopeful that will make  significant improvement again.  Discussed icing regimen and home exercises, which activities to do and which ones to avoid.  Increase activity slowly.  Discussed icing regimen.  Follow-up with me again in 6 to 8 weeks otherwise.  Patellofemoral arthritis of right knee Chronic problem with or exacerbation.  Did well with the contralateral side so we will try again on this side for the first time.  I do believe patient will respond well.  Follow-up again in 6 to 8 weeks  Neck muscle spasm Has done great with posture and ergonomics, and is increasing activity.  Discussed icing regimen.  Follow-up again in 6 to 8 weeks    Decision today to treat with OMT was based on Physical Exam  After verbal consent patient was treated with HVLA, ME, FPR techniques in cervical, thoracic, rib, lumbar and sacral areas, all areas are chronic   Patient tolerated the procedure well with improvement in symptoms  Patient given exercises, stretches and lifestyle modifications  See medications in patient instructions if given  Patient will follow up in 4-8 weeks  The above documentation has been reviewed and is accurate and complete Reshonda Koerber M Adean Milosevic,  DO

## 2024-01-09 ENCOUNTER — Ambulatory Visit: Admitting: Family Medicine

## 2024-01-09 ENCOUNTER — Encounter: Payer: Self-pay | Admitting: Family Medicine

## 2024-01-09 VITALS — BP 112/80 | HR 92 | Ht 66.0 in | Wt 141.0 lb

## 2024-01-09 DIAGNOSIS — M25511 Pain in right shoulder: Secondary | ICD-10-CM | POA: Diagnosis not present

## 2024-01-09 DIAGNOSIS — M9908 Segmental and somatic dysfunction of rib cage: Secondary | ICD-10-CM | POA: Diagnosis not present

## 2024-01-09 DIAGNOSIS — M9904 Segmental and somatic dysfunction of sacral region: Secondary | ICD-10-CM

## 2024-01-09 DIAGNOSIS — M9901 Segmental and somatic dysfunction of cervical region: Secondary | ICD-10-CM

## 2024-01-09 DIAGNOSIS — M1712 Unilateral primary osteoarthritis, left knee: Secondary | ICD-10-CM

## 2024-01-09 DIAGNOSIS — M9903 Segmental and somatic dysfunction of lumbar region: Secondary | ICD-10-CM

## 2024-01-09 DIAGNOSIS — M1711 Unilateral primary osteoarthritis, right knee: Secondary | ICD-10-CM | POA: Diagnosis not present

## 2024-01-09 DIAGNOSIS — M25512 Pain in left shoulder: Secondary | ICD-10-CM

## 2024-01-09 DIAGNOSIS — M9902 Segmental and somatic dysfunction of thoracic region: Secondary | ICD-10-CM

## 2024-01-09 DIAGNOSIS — M62838 Other muscle spasm: Secondary | ICD-10-CM

## 2024-01-09 MED ORDER — SODIUM HYALURONATE 60 MG/3ML IX PRSY
120.0000 mg | PREFILLED_SYRINGE | Freq: Once | INTRA_ARTICULAR | Status: AC
  Administered 2024-01-09: 120 mg via INTRA_ARTICULAR

## 2024-01-09 NOTE — Assessment & Plan Note (Signed)
 Chronic, with worsening symptoms.  Has responded well to viscosupplementation previously.  9 months out from previous injections.  Hopeful that will make significant improvement again.  Discussed icing regimen and home exercises, which activities to do and which ones to avoid.  Increase activity slowly.  Discussed icing regimen.  Follow-up with me again in 6 to 8 weeks otherwise.

## 2024-01-09 NOTE — Assessment & Plan Note (Signed)
 Chronic problem with or exacerbation.  Did well with the contralateral side so we will try again on this side for the first time.  I do believe patient will respond well.  Follow-up again in 6 to 8 weeks

## 2024-01-09 NOTE — Patient Instructions (Signed)
 Good to see you! Injection in knees and shoulder See you again in 6-7 weeks

## 2024-01-09 NOTE — Assessment & Plan Note (Signed)
 Has done great with posture and ergonomics, and is increasing activity.  Discussed icing regimen.  Follow-up again in 6 to 8 weeks

## 2024-01-18 NOTE — Assessment & Plan Note (Signed)
 Repeat injection given today.  Chronic problem with exacerbation.  Has been nearly 6 months.  Discussed keeping hands within peripheral vision.  Follow-up again in 2 to 3 months

## 2024-02-17 NOTE — Progress Notes (Unsigned)
 [name] [address] [place] [address] Phone: [phone] Subjective:   [name], am serving as a scribe for Dr. Ronnell Coins.  I'm seeing this patient by the request  of:  [name], MD  CC: Back and neck pain follow-up, knee pain and shoulder pain  [id number]  [name] is a 58 y.o. female coming in with complaint of back and neck pain. OMT on 01/09/2024 Patient states that she is having pain beneath R scapula for past 2 months that seems to be worsening. Tries to stretch to alleviate pain but had to take Advil last night. Also having R sided neck tenderness.   Gel helped R knee but did not help L knee. Painful at end ranges over lateral aspect.    Taking:    Viscosupplementation was given Jan 09, 2024     Reviewed prior external information including notes and imaging from previsou exam, outside providers and external EMR if available.   As well as notes that were available from care everywhere and other healthcare systems.  Past medical history, social, surgical and family history all reviewed in electronic medical record.  No pertanent information unless stated regarding to the chief complaint.   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 POSITIVE muscle aches  Objective  Blood pressure 128/82, pulse 64, height 5' 6  (1.676 m), weight 134 lb (60.8 kg), SpO2 98%.   General: No apparent distress alert and oriented x3 mood and affect normal, dressed appropriately.  HEENT: Pupils equal, extraocular movements intact  Respiratory: Patient's speak in full sentences and does not appear short of breath  Cardiovascular: No lower extremity edema, non tender, no erythema  Gait MSK:  Back does have some loss of lordosis noted.  Some tenderness to palpation in the paraspinal musculature.  Tightness in the neck and more on the right side.  Tightness in the right parascapular area with what appears to be a slipped rib 2. Shoulder exam shows Knee exam shows arthritic changes noted on the left side.  Patellofemoral arthritis noted.  Crepitus noted.  Osteopathic findings  C2 flexed rotated and side bent right C6 flexed rotated and side bent left T5 extended rotated and side bent right inhaled rib T8 extended rotated and side bent left inhaled rib L2 flexed rotated and side bent right L3 flexed rotated and side bent left Sacrum right on right   After informed written and verbal consent, patient was seated on exam table. Left knee was prepped with alcohol swab and utilizing anterolateral approach, patient's left knee space was injected with 4:1  marcaine 0.5%: Kenalog  40mg /dL. Patient tolerated the procedure well without immediate complications.    Assessment and Plan:  Degenerative arthritis of left knee Patient given injection and tolerated the procedure well, discussed icing regimen of home exercises, patient  did not respond well to the viscosupplementation.  Is traveling and will be doing a lot of hiking so decided on this.  Discussed the possibility of PRP.  Follow-up again in 6 to 8 weeks    Nonallopathic problems  Decision today to treat with OMT was based on Physical Exam  After verbal consent patient was treated with HVLA, ME, FPR techniques in cervical, rib, thoracic, lumbar, and sacral  areas  Patient  tolerated the procedure well with improvement in symptoms  Patient given exercises, stretches and lifestyle modifications  See medications in patient instructions if given  Patient will follow up in 4-8 weeks     The above documentation has been reviewed and is accurate and complete Jeffie Spivack M Jenavie Stanczak, DO         Note: This dictation was prepared with Dragon dictation along with smaller phrase technology. Any transcriptional errors that result from this process are unintentional.

## 2024-02-20 ENCOUNTER — Ambulatory Visit: Admitting: Family Medicine

## 2024-02-20 ENCOUNTER — Encounter: Payer: Self-pay | Admitting: Family Medicine

## 2024-02-20 VITALS — BP 128/82 | HR 64 | Ht 66.0 in | Wt 134.0 lb

## 2024-02-20 DIAGNOSIS — M1712 Unilateral primary osteoarthritis, left knee: Secondary | ICD-10-CM

## 2024-02-20 DIAGNOSIS — M9908 Segmental and somatic dysfunction of rib cage: Secondary | ICD-10-CM

## 2024-02-20 DIAGNOSIS — M9903 Segmental and somatic dysfunction of lumbar region: Secondary | ICD-10-CM | POA: Diagnosis not present

## 2024-02-20 DIAGNOSIS — M9902 Segmental and somatic dysfunction of thoracic region: Secondary | ICD-10-CM

## 2024-02-20 DIAGNOSIS — M9901 Segmental and somatic dysfunction of cervical region: Secondary | ICD-10-CM | POA: Diagnosis not present

## 2024-02-20 DIAGNOSIS — M9904 Segmental and somatic dysfunction of sacral region: Secondary | ICD-10-CM | POA: Diagnosis not present

## 2024-02-20 DIAGNOSIS — M5030 Other cervical disc degeneration, unspecified cervical region: Secondary | ICD-10-CM

## 2024-02-20 MED ORDER — PREDNISONE 20 MG PO TABS: 40.0000 mg | ORAL_TABLET | Freq: Every day | ORAL | 0 refills | Status: DC

## 2024-02-20 NOTE — Assessment & Plan Note (Signed)
 Known arthritic changes but doing relatively well.  Has lost another good amount of weight and I do think is helping some of the neck and back pain.  Continue to be active.  Responded extremely well to osteopathic manipulation.  Follow-up again in 6 to 8 weeks

## 2024-02-20 NOTE — Patient Instructions (Signed)
 Prednisone  40mg  for 5 days See me again in 6 weeks

## 2024-02-20 NOTE — Assessment & Plan Note (Signed)
 Patient given injection and tolerated the procedure well, discussed icing regimen of home exercises, patient did not respond well to the viscosupplementation.  Is traveling and will be doing a lot of hiking so decided on this.  Discussed the possibility of PRP.  Follow-up again in 6 to 8 weeks

## 2024-03-19 ENCOUNTER — Encounter: Payer: Self-pay | Admitting: Family Medicine

## 2024-03-19 ENCOUNTER — Ambulatory Visit: Admitting: Family Medicine

## 2024-03-19 VITALS — BP 122/70 | HR 77 | Ht 66.0 in | Wt 134.8 lb

## 2024-03-19 DIAGNOSIS — M62838 Other muscle spasm: Secondary | ICD-10-CM

## 2024-03-19 DIAGNOSIS — M9901 Segmental and somatic dysfunction of cervical region: Secondary | ICD-10-CM | POA: Diagnosis not present

## 2024-03-19 DIAGNOSIS — M542 Cervicalgia: Secondary | ICD-10-CM | POA: Diagnosis not present

## 2024-03-19 DIAGNOSIS — M9902 Segmental and somatic dysfunction of thoracic region: Secondary | ICD-10-CM | POA: Diagnosis not present

## 2024-03-19 DIAGNOSIS — M9908 Segmental and somatic dysfunction of rib cage: Secondary | ICD-10-CM | POA: Diagnosis not present

## 2024-03-19 DIAGNOSIS — M9904 Segmental and somatic dysfunction of sacral region: Secondary | ICD-10-CM | POA: Diagnosis not present

## 2024-03-19 DIAGNOSIS — M9903 Segmental and somatic dysfunction of lumbar region: Secondary | ICD-10-CM

## 2024-03-19 MED ORDER — PREDNISONE 20 MG PO TABS: 40.0000 mg | ORAL_TABLET | Freq: Every day | ORAL | 0 refills | Status: DC

## 2024-03-19 MED ORDER — METHOCARBAMOL 500 MG PO TABS: 500.0000 mg | ORAL_TABLET | Freq: Three times a day (TID) | ORAL | 0 refills | Status: DC

## 2024-03-19 MED ORDER — KETOROLAC TROMETHAMINE 60 MG/2ML IM SOLN
60.0000 mg | Freq: Once | INTRAMUSCULAR | Status: AC
  Administered 2024-03-19: 60 mg via INTRAMUSCULAR

## 2024-03-19 NOTE — Progress Notes (Signed)
 [name] [address] [place] [address] Phone: [phone] Subjective:   [name] am a scribe for Dr. Claudene.   I'm seeing this patient by the request  of:  [name], MD  CC: Neck and shoulder pain  [name]  [name] is a 58 y.o. female coming in with complaint of back and neck pain last seen a month ago and had more degenerative arthritis.  Patient states that back and neck is awful today. 7/10 pain scale. Traveled and had a small fall. Started Prednoisone three days ago. In a lot of pain despite using voltaren, arnica, and other meds.   Medications patient has been prescribed:   Taking:         Reviewed prior external information including notes and imaging from previsou exam, outside providers and external EMR if available.   As well as notes that were available from care everywhere and other healthcare systems.  Past medical history, social, surgical and family history all reviewed in electronic medical record.  No pertanent information unless stated regarding to the chief complaint.   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 POSITIVE muscle aches  Objective  Blood pressure 122/70, pulse 77, height 5' 6 (1.676 m), weight 134 lb 12.8 oz (61.1 kg), SpO2 (!) 80%.   General: No apparent distress alert and  oriented x3 mood and affect normal, dressed appropriately.  HEENT: Pupils equal, extraocular movements intact  Respiratory: Patient's speak in full sentences and does not appear short of breath  Cardiovascular: No lower extremity edema, non tender, no erythema  Gait MSK:  Back does have some loss lordosis but the neck seems to be most tight.  Difficulty with flexion and extension noted.  Patient has limited range of motion noted in all planes.  Some limited some voluntary guarding.  Osteopathic findings  C2 flexed rotated and side bent right C6 flexed rotated and side bent left C7 flexed rotated and side bent left T2 extended rotated and side bent left with inhaled rib T3 extended rotated and side bent right inhaled rib T9 extended rotated and side bent left L2 flexed rotated and side bent right Sacrum right on right       Assessment and Plan:  Neck muscle spasm Chronic problem with exacerbation.  Discussed which activities to do and which ones to avoid.  Increase activity slowly.  Discussed icing regimen.  Follow-up with me again in 6 to 8 weeks otherwise.  Toradol  given.  Patient wanted to hold on the Depo-Medrol  because patient had difficulty sleeping last time.  Given Robaxin  to try as well muscle relaxer to see if we can get some improvement with this seeming to be more muscular than that and nerve related at this time.  All bony things such as an epidural at the moment.  Follow-up with me again 6 to 8 weeks.    Nonallopathic problems  Decision today to treat with OMT was based on Physical Exam  After verbal consent patient was treated with HVLA, ME, FPR techniques in cervical, rib, thoracic, lumbar, and sacral  areas avoided any HVLA on the neck secondary to tightness.  Patient tolerated the procedure well with improvement in symptoms  Patient given exercises, stretches and lifestyle modifications  See medications in patient instructions if given  Patient will follow up  in 4-8 weeks     The above documentation has been reviewed and is accurate and complete Jereld Presti M Mita Vallo, DO         Note: This dictation was prepared with Dragon dictation along with smaller phrase technology. Any transcriptional errors that result from this process are unintentional.

## 2024-03-19 NOTE — Assessment & Plan Note (Addendum)
 Chronic problem with exacerbation.  Discussed which activities to do and which ones to avoid.  Increase activity slowly.  Discussed icing regimen.  Follow-up with me again in 6 to 8 weeks otherwise.  Toradol  given.  Patient wanted to hold on the Depo-Medrol  because patient had difficulty sleeping last time.  Given Robaxin  to try as well muscle relaxer to see if we can get some improvement with this seeming to be more muscular than that and nerve related at this time.  All bony things such as an epidural at the moment.  Follow-up with me again 6 to 8 weeks.

## 2024-03-19 NOTE — Patient Instructions (Addendum)
 Good to see you. Toradol  60 mg in buttocks today.  Sending in prednisone  just in case.  Start Robaxin .  See me again as scheduled.

## 2024-04-03 NOTE — Progress Notes (Unsigned)
 [name] [address] [place] [address] Phone: [phone] Subjective:   [name], am serving as a scribe for Dr. Arthea Claudene.  I'm seeing this patient by the request  of:  [name], MD  CC: back and neck pain follow up   [name]  [name] is a 58 y.o. female coming in with complaint of back and neck pain. OMT on 03/19/2024. Patient states that she is doing a lot better. Has been getting massages and acupuncture which have been helpful. Not in pain at all today.   L knee is stiff with sitting for prolonged period. It is better after getting steroid injection.   Primary doc recommends [employer], an online strength program.   Medications patient has been prescribed: robaxin  prednisone   Taking:         Reviewed prior external information including notes and imaging from previsou exam, outside providers and external EMR if available.   As well as notes that were available from care everywhere and other healthcare systems.  Past medical history, social, surgical and family history all reviewed in electronic medical record.  No pertanent information unless stated regarding to the chief complaint.   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 POSITIVE muscle aches  Objective  Blood pressure 110/72, pulse 70, height  5' 6 (1.676 m), weight 137 lb (62.1 kg), SpO2 98%.   General: No apparent distress alert and oriented x3 mood and affect normal, dressed appropriately.  HEENT: Pupils equal, extraocular movements intact  Respiratory: Patient's speak in full sentences and does not appear short of breath  Cardiovascular: No lower extremity edema, non tender, no erythema  Gait antalgic gait knees bilaterally does have some crepitus noted.  Left knee does have some instability noted of the MCL it feels like.  Right knee does have significant lateral tracking of the patella.  Positive patellar grind test noted. MSK:  Back does have some loss lordosis noted.  Neck exam does have some limited sidebending bilaterally.  Osteopathic findings  C3 flexed rotated and side bent right C7 flexed rotated and side bent left T3 extended rotated and side bent right inhaled rib T7 extended rotated and side bent left L2 flexed rotated and side bent right Sacrum right on right    Assessment and Plan:  Degenerative arthritis of left knee Known degenerative arthritic changes noted.  Discussed with patient about potential compression.  Now having instability though since she is falling.  Having more difficulty even with her right knee now.  Known arthritic changes but unfortunately could be missing a possible ligamentous injury that I do think we could change medical management.  Patient is going to get x-rays and MRIs of the knees bilaterally to further evaluate.  Patient wants to be active but finding it difficult secondary to these  knees continuing to give her instability and swelling.  Patellofemoral arthritis of right knee Viscosupplementation was not helpful.  Continues to have discomfort and pain.  Affecting daily activities.  Will further evaluate with MRI.  Past medical history significant for cancer.    Nonallopathic problems  Decision today to treat with OMT was based on Physical Exam  After verbal consent patient  was treated with HVLA, ME, FPR techniques in cervical, rib, thoracic, lumbar, and sacral  areas  Patient tolerated the procedure well with improvement in symptoms  Patient given exercises, stretches and lifestyle modifications  See medications in patient instructions if given  Patient will follow up in 4-8 weeks    The above documentation has been reviewed and is accurate and complete [name] M Aamiyah Derrick, DO          Note: This dictation was prepared with Dragon dictation along with smaller phrase technology. Any transcriptional errors that result from this process are unintentional.

## 2024-04-04 ENCOUNTER — Ambulatory Visit: Admitting: Family Medicine

## 2024-04-04 ENCOUNTER — Encounter: Payer: Self-pay | Admitting: Family Medicine

## 2024-04-04 ENCOUNTER — Ambulatory Visit (INDEPENDENT_AMBULATORY_CARE_PROVIDER_SITE_OTHER)

## 2024-04-04 VITALS — BP 110/72 | HR 70 | Ht 66.0 in | Wt 137.0 lb

## 2024-04-04 DIAGNOSIS — G8929 Other chronic pain: Secondary | ICD-10-CM | POA: Diagnosis not present

## 2024-04-04 DIAGNOSIS — M1712 Unilateral primary osteoarthritis, left knee: Secondary | ICD-10-CM

## 2024-04-04 DIAGNOSIS — M9903 Segmental and somatic dysfunction of lumbar region: Secondary | ICD-10-CM | POA: Diagnosis not present

## 2024-04-04 DIAGNOSIS — M1711 Unilateral primary osteoarthritis, right knee: Secondary | ICD-10-CM | POA: Diagnosis not present

## 2024-04-04 DIAGNOSIS — M5030 Other cervical disc degeneration, unspecified cervical region: Secondary | ICD-10-CM

## 2024-04-04 DIAGNOSIS — M9904 Segmental and somatic dysfunction of sacral region: Secondary | ICD-10-CM

## 2024-04-04 DIAGNOSIS — M25561 Pain in right knee: Secondary | ICD-10-CM

## 2024-04-04 DIAGNOSIS — M9902 Segmental and somatic dysfunction of thoracic region: Secondary | ICD-10-CM

## 2024-04-04 DIAGNOSIS — M25562 Pain in left knee: Secondary | ICD-10-CM | POA: Diagnosis not present

## 2024-04-04 DIAGNOSIS — M9908 Segmental and somatic dysfunction of rib cage: Secondary | ICD-10-CM

## 2024-04-04 DIAGNOSIS — M9901 Segmental and somatic dysfunction of cervical region: Secondary | ICD-10-CM

## 2024-04-04 NOTE — Assessment & Plan Note (Signed)
 Viscosupplementation was not helpful.  Continues to have discomfort and pain.  Affecting daily activities.  Will further evaluate with MRI.  Past medical history significant for cancer.

## 2024-04-04 NOTE — Assessment & Plan Note (Signed)
 Chronic problem noted, discussed icing regimen and home exercises, discussed which activities to do in which ones to avoid.  Increase activity slowly.  Discussed icing regimen.  Follow-up with me again 6 to 8 weeks.  Did respond well to her massages.  No arthritic changes we will continue to monitor.

## 2024-04-04 NOTE — Patient Instructions (Addendum)
 [email or web address] Xray today MRI today See me again in 2 months

## 2024-04-04 NOTE — Assessment & Plan Note (Signed)
 Known degenerative arthritic changes noted.  Discussed with patient about potential compression.  Now having instability though since she is falling.  Having more difficulty even with her right knee now.  Known arthritic changes but unfortunately could be missing a possible ligamentous injury that I do think we could change medical management.  Patient is going to get x-rays and MRIs of the knees bilaterally to further evaluate.  Patient wants to be active but finding it difficult secondary to these knees continuing to give her instability and swelling.

## 2024-04-11 ENCOUNTER — Encounter: Payer: Self-pay | Admitting: Family Medicine

## 2024-04-13 ENCOUNTER — Ambulatory Visit: Payer: Self-pay | Admitting: Family Medicine

## 2024-04-15 ENCOUNTER — Ambulatory Visit
Admission: RE | Admit: 2024-04-15 | Discharge: 2024-04-15 | Disposition: A | Discharge status: Home / Self Care | Source: Ambulatory Visit | Attending: Family Medicine | Admitting: Family Medicine

## 2024-04-15 DIAGNOSIS — G8929 Other chronic pain: Secondary | ICD-10-CM

## 2024-05-15 NOTE — Progress Notes (Unsigned)
 [name] [address] [place] [address] Phone: [phone] Subjective:   [name] am a scribe for Dr. Claudene.   I'm seeing this patient by the request  of:  Mary Alm PARAS, MD  CC: Back and neck pain follow-up, bilateral knee pain  [name]  [name] is a 58 y.o. female coming in with complaint of back and neck pain. OMT on 04/04/2024. Patient states left hip a lot of pain (about a week ago). Between shoulders on the left side (started Sunday). Left knee hurts worse than the right. Going up and down stairs are an issue. Going on a trip soon and would like to be proactive. Had MRI of knees since last visit.  Medications patient has been prescribed: robaxin  prednisone   Taking: no         Reviewed prior external information including notes and imaging from previsou exam, outside providers and external EMR if available.   As well as notes that were available from care everywhere and other healthcare systems.  Past medical history, social, surgical and family history all reviewed in electronic medical record.  No pertanent information unless stated regarding to the chief complaint.   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 POSITIVE muscle aches  Objective  Blood pressure 122/70, pulse 88, height 5' 6 (1.676 m), weight 139 lb  (63 kg), SpO2 99%.   General: No apparent distress alert and oriented x3 mood and affect normal, dressed appropriately.  HEENT: Pupils equal, extraocular movements intact  Respiratory: Patient's speak in full sentences and does not appear short of breath  Cardiovascular: No lower extremity edema, non tender, no erythema  MSK:  Back does have some loss of lordosis noted.  Some tenderness to palpation in the neck noted bilaterally with some limited side bending Knee examination of the patellofemoral joint bilaterally and crepitus noted on the right knee compared to the left.  Lateral tracking of the patella Patient's left knee does have just a trace effusion noted.  Severe tenderness to palpation over the greater trochanteric area on the left side.  Positive Deri noted.   Osteopathic findings  C2 flexed rotated and side bent right C5 flexed rotated and side bent left T3 extended rotated and side bent right inhaled rib T9 extended rotated and side bent left L2 flexed rotated and side bent right L3 flexed rotated and side bent left Sacrum right on right   After informed written and verbal consent, patient was seated on exam table. Right knee was prepped with alcohol swab and utilizing anterolateral approach, patient's right knee space was injected with 4:1  marcaine 0.5%: Kenalog  40mg /dL. Patient tolerated the procedure well without immediate complications.  After informed written and verbal consent, patient was seated on exam table. Left knee was prepped with alcohol swab and utilizing anterolateral approach,  patient's left knee space was injected with 4:1  marcaine 0.5%: Kenalog  40mg /dL. Patient tolerated the procedure well without immediate complications.   After verbal consent patient was prepped with alcohol swab and with a 21-gauge 2 inch needle injected into the right greater trochanteric area with 2 cc of 0.5% Marcaine and 1 cc of Kenalog  40 mg/mL.  No blood loss.  Band-Aid placed.   Postinjection instructions given   Assessment and Plan:  Greater trochanteric bursitis of left hip Patient given injection and tolerated the procedure well, discussed icing regimen and home exercises.  Given injection with patient traveling.  Increase activity slowly.  Follow-up with me again in 6 to 8 weeks otherwise.  Degenerative arthritis of knee, bilateral Bilateral injections given today and tolerated the procedure well, discussed icing regimen and home exercises.  Discussed which activities to do in the discussed the MRI in great detail about mostly being patellofemoral arthritis we discussed the potential for unilateral compartment, arthroscopic procedure or potential knee replacement.  Patient wants to see how the injection is doing and will continue to stay active.  Follow-up again 6 to 8 weeks can consider PRP    Nonallopathic problems  Decision today to treat with OMT was based on Physical Exam  After verbal consent patient was treated with HVLA, ME, FPR techniques in cervical, rib, thoracic, lumbar, and sacral  areas  Patient tolerated the procedure well with improvement in symptoms  Patient given exercises, stretches and lifestyle modifications  See medications in patient instructions if given  Patient will follow up in 4-8 weeks    The above documentation has been reviewed and is accurate and complete [name] M [name], DO          Note: This dictation was prepared with Dragon dictation along with smaller phrase technology. Any transcriptional errors that result from this process are unintentional.

## 2024-05-17 ENCOUNTER — Encounter: Payer: Self-pay | Admitting: Family Medicine

## 2024-05-17 ENCOUNTER — Ambulatory Visit: Admitting: Family Medicine

## 2024-05-17 VITALS — BP 122/70 | HR 88 | Ht 66.0 in | Wt 139.0 lb

## 2024-05-17 DIAGNOSIS — M9903 Segmental and somatic dysfunction of lumbar region: Secondary | ICD-10-CM

## 2024-05-17 DIAGNOSIS — M170 Bilateral primary osteoarthritis of knee: Secondary | ICD-10-CM

## 2024-05-17 DIAGNOSIS — M9901 Segmental and somatic dysfunction of cervical region: Secondary | ICD-10-CM

## 2024-05-17 DIAGNOSIS — M9904 Segmental and somatic dysfunction of sacral region: Secondary | ICD-10-CM | POA: Diagnosis not present

## 2024-05-17 DIAGNOSIS — M7062 Trochanteric bursitis, left hip: Secondary | ICD-10-CM | POA: Diagnosis not present

## 2024-05-17 DIAGNOSIS — M9908 Segmental and somatic dysfunction of rib cage: Secondary | ICD-10-CM

## 2024-05-17 DIAGNOSIS — M9902 Segmental and somatic dysfunction of thoracic region: Secondary | ICD-10-CM

## 2024-05-17 NOTE — Assessment & Plan Note (Addendum)
 Bilateral injections given today and tolerated the procedure well, discussed icing regimen and home exercises.  Discussed which activities to do in the discussed the MRI in great detail about mostly being patellofemoral arthritis we discussed the potential for unilateral compartment, arthroscopic procedure or potential knee replacement.  Patient wants to see how the injection is doing and will continue to stay active.  Follow-up again 6 to 8 weeks can consider PRP

## 2024-05-17 NOTE — Assessment & Plan Note (Signed)
 Patient given injection and tolerated the procedure well, discussed icing regimen and home exercises.  Given injection with patient traveling.  Increase activity slowly.  Follow-up with me again in 6 to 8 weeks otherwise.

## 2024-05-17 NOTE — Patient Instructions (Addendum)
 Good to see you. Injections today. See me again in 4 to 5 weeks.

## 2024-06-04 ENCOUNTER — Other Ambulatory Visit: Payer: Self-pay | Admitting: Obstetrics and Gynecology

## 2024-06-04 DIAGNOSIS — Z1231 Encounter for screening mammogram for malignant neoplasm of breast: Secondary | ICD-10-CM

## 2024-06-11 NOTE — Progress Notes (Unsigned)
 [name] [address] [place] [address] Phone: [phone] Subjective:   [name], am serving as a scribe for Dr. Arthea Claudene.  I'm seeing this patient by the request  of:  Mary Alm PARAS, MD  CC: Left shoulder pain  [name]  [name] is a 58 y.o. female coming in with complaint of L shoulder pain.  Has had this for quite some time that seems to intermittently get worse.  Some of it seems to be more cervical patient states that she has been doing PT and massage. States that her muscles are very tight and painful. Pain in L side of neck and into the L trap. Tried taking muscle relaxer's a few nights which were not helpful. Neck pain triggered when picking up granddaughter.       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   Family history of breast cancer    Family history of prostate cancer    Interstitial cystitis    mild   Neoplasm of appendix    Thyroid  disease    Past Surgical History:  Procedure Laterality Date   APPENDECTOMY  08/2006   BREAST CYST ASPIRATION Right 09/2020   BREAST CYST ASPIRATION Right 2018   COLONOSCOPY  04/17/2007   TUBAL LIGATION  11/1998   UMBILICAL HERNIA REPAIR  10/2016   Social History   Socioeconomic History   Marital status: Married    Spouse name: Not on file   Number of children: 2   Years of education: Not on file   Highest education level: Not on file  Occupational History   Not on file  Tobacco Use   Smoking status: Never   Smokeless tobacco: Never  Vaping Use   Vaping status: Never Used  Substance and Sexual Activity   Alcohol use: Yes    Alcohol/week: 5.0 standard drinks of alcohol    Types: 5 Glasses of wine per week    Comment: occasional   Drug use: No   Sexual activity: Not on file  Other Topics Concern   Not on file  Social History Narrative   Not on file   Social Drivers of Health    Financial Resource Strain: Not on file  Food Insecurity: Not on file  Transportation Needs: Not on file  Physical Activity: Not on file  Stress: Not on file  Social Connections: Not on file   Allergies  Allergen Reactions   Elemental Sulfur Hives   Sulfa Antibiotics Hives and Rash    Reaction not listed   Family History  Problem Relation Age of Onset   Breast cancer Mother 64       had hysterectomy at 61   Prostate cancer Maternal Uncle 70       died shortly after in 87's.  Metastatic- spread to brain and many other locations   Stomach cancer Maternal Grandmother    Lung cancer Paternal Grandmother 96       dx. early 29's died 8   Lung cancer Paternal Grandfather 67       dx in 71's, died at 86   Breast cancer Cousin 30   Skin cancer Brother        had a few removed over lifetime, one was on chest.  Is now 67   Breast cancer Maternal Aunt    Breast cancer Maternal Aunt    Colon cancer Neg Hx  Esophageal cancer Neg Hx    Pancreatic cancer Neg Hx    Rectal cancer Neg Hx     Current Outpatient Medications (Endocrine & Metabolic):    ARMOUR THYROID  PO, Take 60 mg by mouth 2 (two) times daily.    progesterone  (PROMETRIUM ) 200 MG capsule, Take 400 mg by mouth 2 (two) times daily.   predniSONE  (DELTASONE ) 20 MG tablet, Take 2 tablets (40 mg total) by mouth daily with breakfast.   Current Outpatient Medications (Respiratory):    fexofenadine (ALLEGRA) 60 MG tablet, Take 60 mg by mouth once.  Current Outpatient Medications (Analgesics):    meloxicam  (MOBIC ) 15 MG tablet, Take 1 tablet (15 mg total) by mouth daily.   Current Outpatient Medications (Other):    cholecalciferol (VITAMIN D ) 400 units TABS tablet, Take 400 Units by mouth.   MAGNESIUM CITRATE PO*, Take 1 tablet by mouth daily.   Multiple Vitamins-Minerals (MULTIVITAMIN ADULT PO), Take 1 tablet by mouth daily.   Probiotic Product (PROBIOTIC PO), Take 1 tablet by mouth daily.   methocarbamol  (ROBAXIN ) 500 MG  tablet, Take 1 tablet (500 mg total) by mouth 3 (three) times daily. * These medications belong to multiple therapeutic classes and are listed under each applicable group.   Reviewed prior external information including notes and imaging from  primary care provider As well as notes that were available from care everywhere and other healthcare systems.  Past medical history, social, surgical and family history all reviewed in electronic medical record.  No pertanent information unless stated regarding to the chief complaint.   Review of Systems:  No headache, visual changes, nausea, vomiting, diarrhea, constipation, dizziness, abdominal pain, skin rash, fevers, chills, night sweats, weight loss, swollen lymph nodes, body aches, joint swelling, chest pain, shortness of breath, mood changes. POSITIVE muscle aches  Objective  Blood pressure 108/82, pulse 89, height 5' 6 (1.676 m), weight 132 lb (59.9 kg), SpO2 97%.   General: No apparent distress alert and oriented x3 mood and affect normal, dressed appropriately.  HEENT: Pupils equal, extraocular movements intact  Respiratory: Patient's speak in full sentences and does not appear short of breath  Cardiovascular: No lower extremity edema, non tender, no erythema  Left shoulder exam showsMultiple trigger points noted in the left shoulder region.  In the rhomboid, trapezius and levator scapula.   After verbal consent patient was prepped with alcohol swab and with a 25-gauge half inch needle injected into 4 distinct trigger points.  Total of 3 cc of 0.5% Marcaine and 1 cc of Kenalog  40 mg/mL.  Minimal blood loss.  Band-Aids placed.  Postinjection instruction given Impression and Recommendations:    The above documentation has been reviewed and is accurate and complete [name] [name], DO

## 2024-06-12 ENCOUNTER — Encounter: Payer: Self-pay | Admitting: Family Medicine

## 2024-06-12 ENCOUNTER — Other Ambulatory Visit: Payer: Self-pay

## 2024-06-12 ENCOUNTER — Ambulatory Visit: Admitting: Family Medicine

## 2024-06-12 VITALS — BP 108/82 | HR 89 | Ht 66.0 in | Wt 132.0 lb

## 2024-06-12 DIAGNOSIS — M25512 Pain in left shoulder: Secondary | ICD-10-CM

## 2024-06-12 NOTE — Patient Instructions (Signed)
 Injected trigger points in L shoulder today

## 2024-06-12 NOTE — Assessment & Plan Note (Signed)
 Patient has responded well to these previously and 3 distinct muscles today.  Hopefully this will make significant proving.  A little concern of some of the lumbar radiculopathy that we will continue to monitor as well.  Also the cervical radiculopathy and the possibility need for epidurals.  Follow-up with me again in

## 2024-06-19 NOTE — Progress Notes (Unsigned)
 [name] [address] [place] [address] Phone: [phone] Subjective:   [name] am a scribe for Dr. Claudene.   I'm seeing this patient by the request  of:  [name], MD  CC: Low back pain  [name]  06/12/2024 Patient has responded well to these previously and 3 distinct muscles today.  Hopefully this will make significant proving.  A little concern of some of the lumbar radiculopathy that we will continue to monitor as well.  Also the cervical radiculopathy and the possibility need for epidurals.  Follow-up with me again in      Updated 06/21/2024 [name] is a 58 y.o. female coming in with complaint of back pain. Here for MSK. Patient states that back and neck are improving. Pain has moved since last time. The physical therapy did the scraping and that helped. Pain is still in the shoulder blade area.       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   Family history of breast cancer    Family history of prostate cancer    Interstitial cystitis    mild   Neoplasm of appendix    Thyroid  disease    Past Surgical History:  Procedure Laterality Date   APPENDECTOMY  08/2006   BREAST CYST ASPIRATION Right 09/2020   BREAST CYST ASPIRATION Right 2018   COLONOSCOPY  04/17/2007   TUBAL LIGATION  11/1998   UMBILICAL HERNIA REPAIR  10/2016   Social History   Socioeconomic History   Marital status: Married    Spouse name: Not on file   Number of children: 2   Years of education: Not on file   Highest education level: Not on file  Occupational History   Not on file  Tobacco Use   Smoking status: Never   Smokeless tobacco: Never  Vaping Use   Vaping status: Never Used  Substance and Sexual Activity   Alcohol use: Yes    Alcohol/week: 5.0 standard drinks of alcohol    Types: 5 Glasses of wine per week    Comment: occasional   Drug use: No    Sexual activity: Not on file  Other Topics Concern   Not on file  Social History Narrative   Not on file   Social Drivers of Health   Financial Resource Strain: Not on file  Food Insecurity: Not on file  Transportation Needs: Not on file  Physical Activity: Not on file  Stress: Not on file  Social Connections: Not on file   Allergies  Allergen Reactions   Elemental Sulfur Hives   Sulfa Antibiotics Hives and Rash    Reaction not listed   Family History  Problem Relation Age of Onset   Breast cancer Mother 66       had hysterectomy at 41   Prostate cancer Maternal Uncle 70       died shortly after in 52's.  Metastatic- spread to brain and many other locations   Stomach cancer Maternal Grandmother    Lung cancer Paternal Grandmother 40       dx. early 64's died 79   Lung cancer Paternal Grandfather 46       dx in 33's, died at 42   Breast cancer Cousin 30   Skin cancer Brother        had a few removed over lifetime, one was on chest.  Is now 90  Breast cancer Maternal Aunt    Breast cancer Maternal Aunt    Colon cancer Neg Hx    Esophageal cancer Neg Hx    Pancreatic cancer Neg Hx    Rectal cancer Neg Hx     Current Outpatient Medications (Endocrine & Metabolic):    ARMOUR THYROID  PO, Take 60 mg by mouth 2 (two) times daily.    predniSONE  (DELTASONE ) 20 MG tablet, Take 2 tablets (40 mg total) by mouth daily with breakfast.   progesterone  (PROMETRIUM ) 200 MG capsule, Take 400 mg by mouth 2 (two) times daily.   Current Outpatient Medications (Respiratory):    fexofenadine (ALLEGRA) 60 MG tablet, Take 60 mg by mouth once.  Current Outpatient Medications (Analgesics):    meloxicam  (MOBIC ) 15 MG tablet, Take 1 tablet (15 mg total) by mouth daily.   Current Outpatient Medications (Other):    cholecalciferol (VITAMIN D ) 400 units TABS tablet, Take 400 Units by mouth.   MAGNESIUM CITRATE PO*, Take 1 tablet by mouth daily.   methocarbamol  (ROBAXIN ) 500 MG tablet, Take  1 tablet (500 mg total) by mouth 3 (three) times daily.   Multiple Vitamins-Minerals (MULTIVITAMIN ADULT PO), Take 1 tablet by mouth daily.   Probiotic Product (PROBIOTIC PO), Take 1 tablet by mouth daily. * These medications belong to multiple therapeutic classes and are listed under each applicable group.   Reviewed prior external information including notes and imaging from  primary care provider As well as notes that were available from care everywhere and other healthcare systems.  Past medical history, social, surgical and family history all reviewed in electronic medical record.  No pertanent information unless stated regarding to the chief complaint.   Review of Systems:  No headache, visual changes, nausea, vomiting, diarrhea, constipation, dizziness, abdominal pain, skin rash, fevers, chills, night sweats, weight loss, swollen lymph nodes, body aches, joint swelling, chest pain, shortness of breath, mood changes. POSITIVE muscle aches  Objective  Blood pressure 110/70, pulse 79, height 5' 6 (1.676 m), weight 131 lb 12.8 oz (59.8 kg), SpO2 98%.   General: No apparent distress alert and oriented x3 mood and affect normal, dressed appropriately.  HEENT: Pupils equal, extraocular movements intact  Respiratory: Patient's speak in full sentences and does not appear short of breath  Cardiovascular: No lower extremity edema, non tender, no erythema  Patient has signal tenderness on neck muscle spasm.  Patient also has some pain in the parascapular area bilaterally.  No mid tenderness.  Osteopathic findings C2 flexed rotated and side bent right C4 flexed rotated and side bent left C7 flexed rotated and side bent left T3 extended rotated and side bent right inhaled third rib T7 extended rotated and side bent left inhaled rib     Impression and Recommendations:    Neck muscle spasm Next visit noted, discussed icing regimen and home exercises, discussed which activities to do and  which ones to avoid.  Increase activity slowly.  Discussed icing regimen.  Follow-up again in 6 to 8 weeks    Decision today to treat with OMT was based on Physical Exam  After verbal consent patient was treated with  ME, FPR techniques in cervical, thoracic, rib, lumbar and sacral areas, all areas are chronic   Patient tolerated the procedure well with improvement in symptoms  Patient given exercises, stretches and lifestyle modifications  See medications in patient instructions if given  Patient will follow up in 4-8 weeks  The above documentation has been reviewed and is accurate and complete  Myiah Petkus M Aydan Levitz, DO

## 2024-06-21 ENCOUNTER — Ambulatory Visit: Admitting: Family Medicine

## 2024-06-21 ENCOUNTER — Encounter: Payer: Self-pay | Admitting: Family Medicine

## 2024-06-21 VITALS — BP 110/70 | HR 79 | Ht 66.0 in | Wt 131.8 lb

## 2024-06-21 DIAGNOSIS — M62838 Other muscle spasm: Secondary | ICD-10-CM

## 2024-06-21 DIAGNOSIS — M9902 Segmental and somatic dysfunction of thoracic region: Secondary | ICD-10-CM

## 2024-06-21 DIAGNOSIS — M9904 Segmental and somatic dysfunction of sacral region: Secondary | ICD-10-CM

## 2024-06-21 DIAGNOSIS — M9901 Segmental and somatic dysfunction of cervical region: Secondary | ICD-10-CM | POA: Diagnosis not present

## 2024-06-21 DIAGNOSIS — M9903 Segmental and somatic dysfunction of lumbar region: Secondary | ICD-10-CM

## 2024-06-21 DIAGNOSIS — M9908 Segmental and somatic dysfunction of rib cage: Secondary | ICD-10-CM

## 2024-06-21 NOTE — Patient Instructions (Addendum)
 Good to see you. I think we are making progress. See me again in 6 to 8 weeks.

## 2024-06-21 NOTE — Assessment & Plan Note (Signed)
 Next visit noted, discussed icing regimen and home exercises, discussed which activities to do and which ones to avoid.  Increase activity slowly.  Discussed icing regimen.  Follow-up again in 6 to 8 weeks

## 2024-06-22 ENCOUNTER — Ambulatory Visit: Admitting: Family Medicine

## 2024-07-06 ENCOUNTER — Ambulatory Visit
Admission: RE | Admit: 2024-07-06 | Discharge: 2024-07-06 | Disposition: A | Discharge status: Home / Self Care | Source: Ambulatory Visit | Attending: Obstetrics and Gynecology | Admitting: Obstetrics and Gynecology

## 2024-07-06 DIAGNOSIS — Z1231 Encounter for screening mammogram for malignant neoplasm of breast: Secondary | ICD-10-CM

## 2024-07-24 NOTE — Progress Notes (Unsigned)
 [name] [address] [place] [address] Phone: [phone] Subjective:   [name], am serving as a scribe for Dr. Arthea Claudene.  I'm seeing this patient by the request  of:  [name], MD  CC: Back and neck pain follow-up  [name]  JUDEEN GERALDS is a 58 y.o. female coming in with complaint of back and neck pain. OMT on 06/21/2024. Patient states that she has been doing PT with Norleen Grana. Less pain. Ribs on R side are out of place today.   Patella on R knee has been moving more. Has a brace but she is trying to use chopat straps which are more helpful. Pain in L knee is starting to increase again. Wants to know if we should wait until January for PRP as she would like foot injection at that time as well.     Medications patient has been prescribed:   Taking:         Reviewed prior external information including notes and imaging from previsou exam, outside providers and external EMR if available.   As well as notes that were available from care everywhere and other healthcare systems.  Past medical history, social, surgical and family history all reviewed in electronic medical record.  No pertanent information unless stated regarding to the chief complaint.   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 POSITIVE muscle  aches  Objective  Blood pressure 124/84, pulse 86, height 5' 6 (1.676 m), weight 126 lb (57.2 kg), SpO2 98%.   General: No apparent distress alert and oriented x3 mood and affect normal, dressed appropriately.  HEENT: Pupils equal, extraocular movements intact  Respiratory: Patient's speak in full sentences and does not appear short of breath  Cardiovascular: No lower extremity edema, non tender, no erythema  Neck exam shows some loss of lordosis noted.  Some tenderness to palpation in the paraspinal musculature.  Osteopathic findings  C2 flexed rotated and side bent right C4 flexed rotated and side bent left T4 extended rotated and side bent right inhaled rib L2 flexed rotated and side bent right Left L3 flexed rotated and side bent left Sacrum right on right       Assessment and Plan:  Degenerative cervical disc Has done remarkably well.  Not having as much swelling.  Patient I think with less stresses also playing and feeling better.  Discussed which activities to do and which ones to avoid.  Increase activity slowly.  Follow-up again in 6 weeks.  Will consider doing PRP on the foot and left knee.    Nonallopathic problems  Decision today to treat with OMT was based on Physical Exam  After verbal consent patient was treated with HVLA, ME, FPR techniques in cervical, rib, thoracic, lumbar, and sacral  areas  Patient tolerated the procedure well with  improvement in symptoms  Patient given exercises, stretches and lifestyle modifications  See medications in patient instructions if given  Patient will follow up in 4-8 weeks     The above documentation has been reviewed and is accurate and complete Alexine Pilant M Kethan Papadopoulos, DO         Note: This dictation was prepared with Dragon dictation along with smaller phrase technology. Any transcriptional errors that result from this process are unintentional.

## 2024-07-25 ENCOUNTER — Encounter: Payer: Self-pay | Admitting: Family Medicine

## 2024-07-25 ENCOUNTER — Ambulatory Visit: Admitting: Family Medicine

## 2024-07-25 VITALS — BP 124/84 | HR 86 | Ht 66.0 in | Wt 126.0 lb

## 2024-07-25 DIAGNOSIS — M9903 Segmental and somatic dysfunction of lumbar region: Secondary | ICD-10-CM

## 2024-07-25 DIAGNOSIS — M5030 Other cervical disc degeneration, unspecified cervical region: Secondary | ICD-10-CM

## 2024-07-25 DIAGNOSIS — M9901 Segmental and somatic dysfunction of cervical region: Secondary | ICD-10-CM

## 2024-07-25 DIAGNOSIS — M9904 Segmental and somatic dysfunction of sacral region: Secondary | ICD-10-CM | POA: Diagnosis not present

## 2024-07-25 DIAGNOSIS — M9908 Segmental and somatic dysfunction of rib cage: Secondary | ICD-10-CM

## 2024-07-25 DIAGNOSIS — M9902 Segmental and somatic dysfunction of thoracic region: Secondary | ICD-10-CM

## 2024-07-25 NOTE — Patient Instructions (Signed)
 Good to see you! Glad everything is working out See you in January for [facility] and MSK

## 2024-07-25 NOTE — Assessment & Plan Note (Signed)
 Has done remarkably well.  Not having as much swelling.  Patient I think with less stresses also playing and feeling better.  Discussed which activities to do and which ones to avoid.  Increase activity slowly.  Follow-up again in 6 weeks.  Will consider doing PRP on the foot and left knee.

## 2024-08-01 ENCOUNTER — Other Ambulatory Visit: Payer: Self-pay | Admitting: Obstetrics and Gynecology

## 2024-08-01 DIAGNOSIS — Z803 Family history of malignant neoplasm of breast: Secondary | ICD-10-CM

## 2024-08-16 ENCOUNTER — Ambulatory Visit: Admitting: Allergy and Immunology

## 2024-08-16 ENCOUNTER — Encounter: Payer: Self-pay | Admitting: Allergy and Immunology

## 2024-08-16 VITALS — BP 118/72 | HR 80 | Resp 16 | Ht 65.7 in | Wt 130.4 lb

## 2024-08-16 DIAGNOSIS — J301 Allergic rhinitis due to pollen: Secondary | ICD-10-CM

## 2024-08-16 DIAGNOSIS — H1010 Acute atopic conjunctivitis, unspecified eye: Secondary | ICD-10-CM

## 2024-08-16 DIAGNOSIS — J4520 Mild intermittent asthma, uncomplicated: Secondary | ICD-10-CM

## 2024-08-16 DIAGNOSIS — J3089 Other allergic rhinitis: Secondary | ICD-10-CM

## 2024-08-16 MED ORDER — AIRSUPRA 90-80 MCG/ACT IN AERO: 2.0000 | INHALATION_SPRAY | RESPIRATORY_TRACT | 1 refills | Status: AC | PRN

## 2024-08-16 MED ORDER — RYALTRIS 665-25 MCG/ACT NA SUSP: NASAL | 5 refills | Status: AC

## 2024-08-16 NOTE — Progress Notes (Signed)
 [place] - [place] - [place] - [place] - [place]   Dear Dr. Remonia,  Thank you for referring [name] to the [facility] [place]  on 08/16/2024.   Below is a summation of this patient's evaluation and recommendations.  Thank you for your referral. I will keep you informed about this patient's response to treatment.   If you have any questions please do not hesitate to contact me.   Sincerely,  Camellia DOROTHA Denis, MD Allergy / Immunology [facility]     ______________________________________________________________________    NEW PATIENT NOTE  Referring Provider: [name], MD Primary Provider: [name], MD Date of office visit: 08/16/2024    Subjective:   Chief Complaint:  [name] (DOB: 08-27-1966) is a 58 y.o. female who presents to the clinic on 08/16/2024 with a chief complaint of Allergic Rhinitis  .     HPI: Bari presents to this clinic in evaluation of allergies.  She has a long history of allergies dating back several decades for which she underwent immunotherapy which she discontinued about 10 years ago.  Immunotherapy appeared to help some of her symptoms and she was doing relatively well while using Flonase and some antihistamines but unfortunately something changed in October 2025.  Since that point in time she has had a lot more nasal congestion and sneezing and blowing her nose and itchy eyes.  There is not really an obvious provoking factor giving rise to this issue as her environment has not changed.  However, it should be noted that she did acquire a new dog in March 2025 after having an interval of 6 months without any dog exposure.  She does continue to use a rescue inhaler very rarely.  She does not really have a history of cold air induced bronchospastic symptoms or exercise-induced bronchospastic symptoms.  It sounds as though viral respiratory  tract infections may be a trigger for setting off her distant history of asthma.  She was on Pulmicort for a while but she discontinued that agent in the middle of 2025.  She definitely has a reactivity to cats and that is an obvious precipitant for getting her nose and her eyes flared up.  She has never had a problem with dog exposure.  She does not receive the flu vaccine.  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   Family history of breast cancer    Family history of prostate cancer    Interstitial cystitis    mild   Neoplasm of appendix    Osteopenia    Thyroid  disease     Past Surgical History:  Procedure Laterality Date   APPENDECTOMY  08/2006   BREAST CYST ASPIRATION Right 09/2020   BREAST CYST ASPIRATION Right 2018   COLONOSCOPY  04/17/2007   HERNIA REPAIR  08/2022   TONSILLECTOMY     TUBAL LIGATION  11/1998   UMBILICAL HERNIA REPAIR  10/2016    Allergies as of 08/16/2024       Reactions   Elemental Sulfur Hives   Sulfa Antibiotics Hives, Rash   Reaction not listed        Medication List    Armour Thyroid  60 MG tablet Generic drug: thyroid  Take by mouth.   CALCIUM-D PO Take by mouth daily.   D3 + K2 PO Take by mouth daily.   fexofenadine 180 MG tablet Commonly  known as: ALLEGRA Take 180 mg by mouth.   Flonase Sensimist 27.5 MCG/SPRAY nasal spray Generic drug: fluticasone Place 2 sprays into the nose daily.   MULTIVITAMIN ADULT PO Take 1 tablet by mouth daily.   NUTRITIONAL SUPPLEMENT PO Take by mouth daily. Estogene control   progesterone  200 MG capsule Commonly known as: PROMETRIUM  Take 400 mg by mouth daily.   PROGESTERONE  PO Take 225 mg by mouth daily.   spironolactone 50 MG tablet Commonly known as: ALDACTONE Take 50 mg by mouth daily.   UNABLE TO FIND in the morning and at bedtime. Bi- Est and Testosterone     Review of systems negative except as noted in HPI / PMHx  or noted below:  Review of Systems  Constitutional: Negative.   HENT: Negative.    Eyes: Negative.   Respiratory: Negative.    Cardiovascular: Negative.   Gastrointestinal: Negative.   Genitourinary: Negative.   Musculoskeletal: Negative.   Skin: Negative.   Neurological: Negative.   Endo/Heme/Allergies: Negative.   Psychiatric/Behavioral: Negative.      Family History  Problem Relation Age of Onset   Breast cancer Mother 87       had hysterectomy at 26   COPD Father    Lung cancer Father    Skin cancer Brother        had a few removed over lifetime, one was on chest.  Is now 39   Breast cancer Maternal Aunt    Breast cancer Maternal Aunt    Prostate cancer Maternal Uncle 70       died shortly after in 79's.  Metastatic- spread to brain and many other locations   Stomach cancer Maternal Grandmother    Lung cancer Paternal Grandmother 74       dx. early 90's died 7   Lung cancer Paternal Grandfather 73       dx in 45's, died at 71   Breast cancer Cousin 30   Colon cancer Neg Hx    Esophageal cancer Neg Hx    Pancreatic cancer Neg Hx    Rectal cancer Neg Hx     Social History   Socioeconomic History   Marital status: Married    Spouse name: Not on file   Number of children: 2   Years of education: Not on file   Highest education level: Not on file  Occupational History   Not on file  Tobacco Use   Smoking status: Never   Smokeless tobacco: Never  Vaping Use   Vaping status: Never Used  Substance and Sexual Activity   Alcohol use: Yes    Alcohol/week: 5.0 standard drinks of alcohol    Types: 5 Glasses of wine per week    Comment: occasional   Drug use: No   Sexual activity: Not on file  Other Topics Concern   Not on file  Social History Narrative   Not on file   Social Drivers of Health   Tobacco Use: Low Risk (08/16/2024)   Patient History    Smoking Tobacco Use: Never    Smokeless Tobacco Use: Never    Passive Exposure: Not on file  Financial  Resource Strain: Not on file  Food Insecurity: Not on file  Transportation Needs: Not on file  Physical Activity: Not on file  Stress: Not on file  Social Connections: Not on file  Intimate Partner Violence: Not on file  Depression (EYV7-0): Not on file  Alcohol Screen: Not on file  Housing: Not on file  Utilities: Not on file  Health Literacy: Not on file    Environmental and Social history  Lives in a house with a dry environment, a dog located inside the household, carpet in the bedroom, plastic on the bed, plastic on the pillow, and no smoking ongoing inside the household.  She works from home with [employer].  Objective:   Vitals:   08/16/24 1411  BP: 118/72  Pulse: 80  Resp: 16  SpO2: 98%   Height: 5' 5.7 (166.9 cm) Weight: 130 lb 6.4 oz (59.1 kg)  Physical Exam Constitutional:      Appearance: She is not diaphoretic.  HENT:     Head: Normocephalic.     Right Ear: Tympanic membrane, ear canal and external ear normal.     Left Ear: Tympanic membrane, ear canal and external ear normal.     Nose: Nose normal. No mucosal edema or rhinorrhea.     Mouth/Throat:     Pharynx: Uvula midline. No oropharyngeal exudate.  Eyes:     Conjunctiva/sclera: Conjunctivae normal.  Neck:     Thyroid : No thyromegaly.     Trachea: Trachea normal. No tracheal tenderness or tracheal deviation.  Cardiovascular:     Rate and Rhythm: Normal rate and regular rhythm.     Heart sounds: Normal heart sounds, S1 normal and S2 normal. No murmur heard. Pulmonary:     Effort: No respiratory distress.     Breath sounds: Normal breath sounds. No stridor. No wheezing or rales.  Lymphadenopathy:     Head:     Right side of head: No tonsillar adenopathy.     Left side of head: No tonsillar adenopathy.     Cervical: No cervical adenopathy.  Skin:    Findings: No erythema or rash.     Nails: There is no clubbing.  Neurological:     Mental Status: She is  alert.     Diagnostics: Allergy skin tests were not performed.   Assessment and Plan:    1. Perennial allergic rhinitis   2. Seasonal allergic rhinitis due to pollen   3. Seasonal allergic conjunctivitis   4. Asthma, mild intermittent, well-controlled    1. Return to clinic for skin testing (no antihistamines)  2. Treat and prevent inflammation of airway:   A. Ryaltris  - 2 sprays each nostril 1-2 times per day  3. If needed:   A. Nasal saline  B. Fexofenadine 180 - 1 tablet 1-2 times per day  C. Pataday - 1 drop each eye 1 time per day  D. Airsupra  - 2 inhalations every 4-6 hours  4. Influenza = tamiflu. Covid = Paxlovid  Honor appears to have a history very consistent with atopic respiratory disease for which we will define her aeroallergen hypersensitivity when she returns to this clinic for skin testing and she can utilize a collection of agents as noted above to address this issue.  If she fails medical therapy which would be a combination of avoidance measures and anti-inflammatory agents utilized in a preventative manner then she would be a candidate for immunotherapy.  Camellia DOROTHA Denis, MD Allergy / Immunology [facility] [place] 

## 2024-08-16 NOTE — Patient Instructions (Addendum)
°  1. Return to clinic for skin testing (no antihistamines)  2. Treat and prevent inflammation of airway:   A. Ryaltris - 2 sprays each nostril 1-2 times per day  3. If needed:   A. Nasal saline  B. Fexofenadine 180 - 1 tablet 1-2 times per day  C. Pataday - 1 drop each eye 1 time per day  D. Airsupra - 2 inhalations every 4-6 hours  4. Influenza = tamiflu. Covid = Paxlovid

## 2024-08-20 ENCOUNTER — Encounter: Payer: Self-pay | Admitting: Allergy and Immunology

## 2024-08-22 ENCOUNTER — Ambulatory Visit: Admitting: Allergy and Immunology

## 2024-08-22 DIAGNOSIS — J301 Allergic rhinitis due to pollen: Secondary | ICD-10-CM | POA: Diagnosis not present

## 2024-08-23 ENCOUNTER — Encounter: Payer: Self-pay | Admitting: Allergy and Immunology

## 2024-08-23 NOTE — Progress Notes (Signed)
 [name] returns to this clinic to have skin testing performed.  She demonstrated hypersensitivity to trees, grasses, weeds.  Allergen avoidance measures provided.

## 2024-09-18 NOTE — Progress Notes (Unsigned)
 " [name] [address] [place] [address] Phone: [phone] Subjective:   [name] am a scribe for Dr. Claudene.   I'm seeing this patient by the request  of:  Mary Alm PARAS, MD  CC: Neck and back pain follow-up  [name]  RIDHIMA GOLBERG is a 59 y.o. female coming in with complaint of back and neck pain. OMT on 06/21/2024. Patient states back has been hurting and the left hip too. Got released from physical therapy. Has more muscle strength in the shoulders. Doing well overall.   Medications patient has been prescribed:   Taking:         Reviewed prior external information including notes and imaging from previsou exam, outside providers and external EMR if available.   As well as notes that were available from care everywhere and other healthcare systems.  Past medical history, social, surgical and family history all reviewed in electronic medical record.  No pertanent information unless stated regarding to the chief complaint.   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 POSITIVE muscle aches  Objective  Blood pressure 110/60, height 5' 5.7 (1.669 m), weight 137 lb (62.1 kg).   General: No apparent distress alert and oriented x3 mood and affect normal, dressed appropriately.  HEENT: Pupils equal, extraocular movements intact  Respiratory: Patient's speak in full sentences and does not appear short of breath   Cardiovascular: No lower extremity edema, non tender, no erythema  Gait MSK:  Back does have mild loss of lordosis.  Improvement though and strength around the shoulder region.  Still has some limited range of motion of the neck noted today.  Some tightness noted with FABER with mild discomfort on the greater trochanteric area on the left side Tenderness over the left AC joint.  Osteopathic findings  C3 flexed rotated and side bent right C6 flexed rotated and side bent right T3 extended rotated and side bent right inhaled rib T9 extended rotated and side bent left L2 flexed rotated and side bent right L3 flexed rotated and side bent left Sacrum right on right  Procedure: Real-time Ultrasound Guided Injection of left acromioclavicular joint Device: GE Logiq Q7 Ultrasound guided injection is preferred based studies that show increased duration, increased effect, greater accuracy, decreased procedural pain, increased response rate, and decreased cost with ultrasound guided versus blind injection.  Verbal informed consent obtained.  Time-out conducted.  Noted no overlying erythema, induration, or other signs of local infection.  Skin prepped in a sterile fashion.  Local anesthesia: Topical Ethyl chloride.  With sterile technique and under real time ultrasound guidance: With a 25-gauge half inch needle injected with 0.5 cc of 0.5% Marcaine and 0.5 cc of Kenalog  40 mg/mL Completed without difficulty  Pain immediately resolved suggesting accurate placement of the medication.  Advised to call if fevers/chills, erythema, induration, drainage, or persistent bleeding.  Impression: Technically successful ultrasound guided injection.   Assessment and Plan:  Degenerative cervical disc Chronic problem but stable.  Encourage patient to continue to do what she is doing.  I do feel that she has more balance in her life recently.  Do think that that has helped out significantly.  Discussed icing  regimen and home exercises.  Follow-up again in 6 to 8 weeks  Acromioclavicular joint pain Chronic problem with exacerbation.  Given injection and tolerated the procedure well.  Post injection instructions given  Degenerative arthritis of knee, bilateral Doing PRP in the near future    Nonallopathic problems  Decision today to treat with OMT was based on Physical Exam  After verbal consent patient was treated with HVLA, ME, FPR techniques in cervical, rib, thoracic, lumbar, and sacral  areas  Patient tolerated the procedure well with improvement in symptoms  Patient given exercises, stretches and lifestyle modifications  See medications in patient instructions if given  Patient will follow up in 4-8 weeks     The above documentation has been reviewed and is accurate and complete Rumaysa Sabatino M Jahzier Villalon, DO         Note: This dictation was prepared with Dragon dictation along with smaller phrase technology. Any transcriptional errors that result from this process are unintentional.            [1]  Allergies Allergen Reactions   Elemental Sulfur Hives   Sulfa Antibiotics Hives and Rash    Reaction not listed   "

## 2024-09-20 ENCOUNTER — Encounter: Payer: Self-pay | Admitting: Family Medicine

## 2024-09-20 ENCOUNTER — Ambulatory Visit: Payer: Self-pay | Admitting: Family Medicine

## 2024-09-20 ENCOUNTER — Ambulatory Visit: Admitting: Family Medicine

## 2024-09-20 ENCOUNTER — Other Ambulatory Visit: Payer: Self-pay

## 2024-09-20 VITALS — BP 110/60 | Ht 65.7 in | Wt 137.0 lb

## 2024-09-20 DIAGNOSIS — M170 Bilateral primary osteoarthritis of knee: Secondary | ICD-10-CM | POA: Diagnosis not present

## 2024-09-20 DIAGNOSIS — M25562 Pain in left knee: Secondary | ICD-10-CM

## 2024-09-20 DIAGNOSIS — M9901 Segmental and somatic dysfunction of cervical region: Secondary | ICD-10-CM

## 2024-09-20 DIAGNOSIS — M5030 Other cervical disc degeneration, unspecified cervical region: Secondary | ICD-10-CM | POA: Diagnosis not present

## 2024-09-20 DIAGNOSIS — M25561 Pain in right knee: Secondary | ICD-10-CM

## 2024-09-20 DIAGNOSIS — M9904 Segmental and somatic dysfunction of sacral region: Secondary | ICD-10-CM

## 2024-09-20 DIAGNOSIS — M9903 Segmental and somatic dysfunction of lumbar region: Secondary | ICD-10-CM | POA: Diagnosis not present

## 2024-09-20 DIAGNOSIS — M25512 Pain in left shoulder: Secondary | ICD-10-CM | POA: Diagnosis not present

## 2024-09-20 DIAGNOSIS — M9902 Segmental and somatic dysfunction of thoracic region: Secondary | ICD-10-CM | POA: Diagnosis not present

## 2024-09-20 DIAGNOSIS — G8929 Other chronic pain: Secondary | ICD-10-CM

## 2024-09-20 DIAGNOSIS — M9908 Segmental and somatic dysfunction of rib cage: Secondary | ICD-10-CM | POA: Diagnosis not present

## 2024-09-20 NOTE — Progress Notes (Signed)
" °  [name] [address] [place] [address] Phone: [phone] Subjective:    I'm seeing this patient by the request  of:  [name], MD  CC: Bilateral knee  [name]  MATTEA SEGER is a 59 y.o. female coming in with complaint of B knee pain. Here for PRP.     Objective     General: No apparent distress alert and oriented x3 mood and affect normal, dressed appropriately.  HEENT: Pupils equal, extraocular movements intact  Respiratory: Patient's speak in full sentences and does not appear short of breath  Cardiovascular: No lower extremity edema, non tender, no erythema   After informed written and verbal consent, patient was seated on exam table. Right knee was prepped with alcohol swab and utilizing anterolateral approach, patient's right knee space was injected with 1:3  marcaine 0.5%: PRP. Patient tolerated the procedure well without immediate complications.  After informed written and verbal consent, patient was seated on exam table. Left knee was prepped with alcohol swab and utilizing anterolateral approach, patient's left knee space was injected with 1:3  marcaine 0.5%: PRP. Patient tolerated the procedure well without immediate complications.    Impression and Recommendations:     The above documentation has been reviewed and is accurate and complete Aubry Rankin M Heddy Vidana, DO    "

## 2024-09-20 NOTE — Assessment & Plan Note (Signed)
 PRP given, tolerated the procedure well, discussed icing regimen, discussed home exercises, discussed post PRP care

## 2024-09-20 NOTE — Assessment & Plan Note (Signed)
 Doing PRP in the near future

## 2024-09-20 NOTE — Assessment & Plan Note (Signed)
 Chronic problem with exacerbation.  Given injection and tolerated the procedure well.  Post injection instructions given

## 2024-09-20 NOTE — Patient Instructions (Signed)
 No ice or IBU for 3 days Heat and Tylenol  are ok See me again in 8-10 weeks

## 2024-09-20 NOTE — Assessment & Plan Note (Signed)
 Chronic problem but stable.  Encourage patient to continue to do what she is doing.  I do feel that she has more balance in her life recently.  Do think that that has helped out significantly.  Discussed icing regimen and home exercises.  Follow-up again in 6 to 8 weeks

## 2024-09-26 ENCOUNTER — Encounter: Payer: Self-pay | Admitting: Family Medicine

## 2024-11-13 ENCOUNTER — Ambulatory Visit: Admitting: Family Medicine

## 2024-12-14 ENCOUNTER — Other Ambulatory Visit
